# Patient Record
Sex: Female | Born: 1955
Health system: Southern US, Community
[De-identification: ages and names within clinical notes are randomized; demographics above are authoritative.]

## PROBLEM LIST (undated history)

## (undated) DIAGNOSIS — I10 Essential (primary) hypertension: Secondary | ICD-10-CM

## (undated) DIAGNOSIS — R109 Unspecified abdominal pain: Secondary | ICD-10-CM

## (undated) DIAGNOSIS — R945 Abnormal results of liver function studies: Secondary | ICD-10-CM

## (undated) DIAGNOSIS — T7840XA Allergy, unspecified, initial encounter: Secondary | ICD-10-CM

## (undated) HISTORY — PX: ABDOMINAL HYSTERECTOMY: SHX81

## (undated) HISTORY — PX: CHOLECYSTECTOMY: SHX55

## (undated) HISTORY — DX: Abnormal results of liver function studies: R94.5

## (undated) HISTORY — DX: Allergy, unspecified, initial encounter: T78.40XA

---

## 1998-06-18 ENCOUNTER — Encounter: Admission: RE | Admit: 1998-06-18 | Discharge: 1998-09-16 | Payer: Self-pay | Admitting: *Deleted

## 1998-08-06 ENCOUNTER — Other Ambulatory Visit: Admission: RE | Admit: 1998-08-06 | Discharge: 1998-08-06 | Payer: Self-pay | Admitting: Obstetrics and Gynecology

## 1998-08-16 ENCOUNTER — Encounter: Payer: Self-pay | Admitting: Obstetrics and Gynecology

## 1998-08-20 ENCOUNTER — Ambulatory Visit (HOSPITAL_COMMUNITY): Admission: RE | Admit: 1998-08-20 | Discharge: 1998-08-20 | Payer: Self-pay | Admitting: Obstetrics and Gynecology

## 1998-08-22 ENCOUNTER — Inpatient Hospital Stay (HOSPITAL_COMMUNITY): Admission: RE | Admit: 1998-08-22 | Discharge: 1998-08-24 | Payer: Self-pay | Admitting: Obstetrics and Gynecology

## 1998-08-25 DIAGNOSIS — R109 Unspecified abdominal pain: Secondary | ICD-10-CM

## 1998-08-25 HISTORY — DX: Unspecified abdominal pain: R10.9

## 1999-07-23 ENCOUNTER — Other Ambulatory Visit: Admission: RE | Admit: 1999-07-23 | Discharge: 1999-07-23 | Payer: Self-pay | Admitting: Obstetrics and Gynecology

## 1999-09-24 ENCOUNTER — Emergency Department (HOSPITAL_COMMUNITY): Admission: EM | Admit: 1999-09-24 | Discharge: 1999-09-24 | Payer: Self-pay | Admitting: *Deleted

## 1999-11-20 ENCOUNTER — Emergency Department (HOSPITAL_COMMUNITY): Admission: EM | Admit: 1999-11-20 | Discharge: 1999-11-20 | Payer: Self-pay | Admitting: Emergency Medicine

## 1999-11-20 ENCOUNTER — Encounter: Payer: Self-pay | Admitting: Emergency Medicine

## 1999-11-29 ENCOUNTER — Encounter: Payer: Self-pay | Admitting: Gastroenterology

## 1999-11-29 ENCOUNTER — Ambulatory Visit (HOSPITAL_COMMUNITY): Admission: RE | Admit: 1999-11-29 | Discharge: 1999-11-29 | Payer: Self-pay | Admitting: Gastroenterology

## 2000-08-07 ENCOUNTER — Ambulatory Visit (HOSPITAL_COMMUNITY): Admission: RE | Admit: 2000-08-07 | Discharge: 2000-08-08 | Payer: Self-pay | Admitting: Surgery

## 2000-08-07 ENCOUNTER — Encounter (INDEPENDENT_AMBULATORY_CARE_PROVIDER_SITE_OTHER): Payer: Self-pay

## 2000-08-07 ENCOUNTER — Encounter: Payer: Self-pay | Admitting: Surgery

## 2000-08-18 ENCOUNTER — Emergency Department (HOSPITAL_COMMUNITY): Admission: EM | Admit: 2000-08-18 | Discharge: 2000-08-18 | Payer: Self-pay | Admitting: Emergency Medicine

## 2000-08-31 ENCOUNTER — Inpatient Hospital Stay (HOSPITAL_COMMUNITY): Admission: EM | Admit: 2000-08-31 | Discharge: 2000-09-03 | Payer: Self-pay | Admitting: *Deleted

## 2000-08-31 ENCOUNTER — Encounter: Payer: Self-pay | Admitting: Urology

## 2000-09-01 ENCOUNTER — Encounter: Payer: Self-pay | Admitting: Gastroenterology

## 2001-02-28 ENCOUNTER — Inpatient Hospital Stay (HOSPITAL_COMMUNITY): Admission: EM | Admit: 2001-02-28 | Discharge: 2001-03-02 | Payer: Self-pay | Admitting: Emergency Medicine

## 2001-02-28 ENCOUNTER — Encounter: Payer: Self-pay | Admitting: Emergency Medicine

## 2001-03-03 ENCOUNTER — Encounter (INDEPENDENT_AMBULATORY_CARE_PROVIDER_SITE_OTHER): Payer: Self-pay | Admitting: Specialist

## 2001-03-03 ENCOUNTER — Ambulatory Visit (HOSPITAL_COMMUNITY): Admission: RE | Admit: 2001-03-03 | Discharge: 2001-03-03 | Payer: Self-pay | Admitting: Gastroenterology

## 2001-03-04 ENCOUNTER — Encounter: Payer: Self-pay | Admitting: Gastroenterology

## 2001-03-04 ENCOUNTER — Ambulatory Visit (HOSPITAL_COMMUNITY): Admission: RE | Admit: 2001-03-04 | Discharge: 2001-03-04 | Payer: Self-pay | Admitting: Gastroenterology

## 2001-03-24 ENCOUNTER — Encounter (INDEPENDENT_AMBULATORY_CARE_PROVIDER_SITE_OTHER): Payer: Self-pay

## 2001-03-24 ENCOUNTER — Ambulatory Visit (HOSPITAL_COMMUNITY): Admission: RE | Admit: 2001-03-24 | Discharge: 2001-03-24 | Payer: Self-pay | Admitting: Gastroenterology

## 2001-04-12 ENCOUNTER — Other Ambulatory Visit: Admission: RE | Admit: 2001-04-12 | Discharge: 2001-04-12 | Payer: Self-pay | Admitting: Obstetrics and Gynecology

## 2001-05-14 ENCOUNTER — Encounter: Payer: Self-pay | Admitting: Internal Medicine

## 2001-05-14 ENCOUNTER — Inpatient Hospital Stay (HOSPITAL_COMMUNITY): Admission: EM | Admit: 2001-05-14 | Discharge: 2001-05-18 | Payer: Self-pay | Admitting: Emergency Medicine

## 2001-05-14 ENCOUNTER — Encounter: Payer: Self-pay | Admitting: Emergency Medicine

## 2001-05-17 ENCOUNTER — Encounter: Payer: Self-pay | Admitting: Internal Medicine

## 2002-12-18 ENCOUNTER — Emergency Department (HOSPITAL_COMMUNITY): Admission: EM | Admit: 2002-12-18 | Discharge: 2002-12-18 | Payer: Self-pay | Admitting: Emergency Medicine

## 2002-12-18 ENCOUNTER — Encounter: Payer: Self-pay | Admitting: Emergency Medicine

## 2002-12-21 ENCOUNTER — Emergency Department (HOSPITAL_COMMUNITY): Admission: EM | Admit: 2002-12-21 | Discharge: 2002-12-21 | Payer: Self-pay | Admitting: Emergency Medicine

## 2003-05-01 ENCOUNTER — Inpatient Hospital Stay (HOSPITAL_COMMUNITY): Admission: EM | Admit: 2003-05-01 | Discharge: 2003-05-02 | Payer: Self-pay | Admitting: Emergency Medicine

## 2003-05-01 ENCOUNTER — Encounter: Payer: Self-pay | Admitting: Internal Medicine

## 2003-11-20 ENCOUNTER — Observation Stay (HOSPITAL_COMMUNITY): Admission: EM | Admit: 2003-11-20 | Discharge: 2003-11-21 | Payer: Self-pay | Admitting: Emergency Medicine

## 2004-03-31 ENCOUNTER — Emergency Department (HOSPITAL_COMMUNITY): Admission: EM | Admit: 2004-03-31 | Discharge: 2004-04-01 | Payer: Self-pay | Admitting: Emergency Medicine

## 2004-08-29 ENCOUNTER — Emergency Department (HOSPITAL_COMMUNITY): Admission: EM | Admit: 2004-08-29 | Discharge: 2004-08-29 | Payer: Self-pay | Admitting: Emergency Medicine

## 2004-11-01 ENCOUNTER — Ambulatory Visit (HOSPITAL_COMMUNITY): Admission: RE | Admit: 2004-11-01 | Discharge: 2004-11-01 | Payer: Self-pay | Admitting: Gastroenterology

## 2004-11-01 ENCOUNTER — Encounter (INDEPENDENT_AMBULATORY_CARE_PROVIDER_SITE_OTHER): Payer: Self-pay | Admitting: Specialist

## 2005-03-09 ENCOUNTER — Emergency Department (HOSPITAL_COMMUNITY): Admission: EM | Admit: 2005-03-09 | Discharge: 2005-03-09 | Payer: Self-pay | Admitting: *Deleted

## 2005-04-02 ENCOUNTER — Emergency Department (HOSPITAL_COMMUNITY): Admission: EM | Admit: 2005-04-02 | Discharge: 2005-04-02 | Payer: Self-pay | Admitting: Emergency Medicine

## 2005-06-02 ENCOUNTER — Emergency Department (HOSPITAL_COMMUNITY): Admission: EM | Admit: 2005-06-02 | Discharge: 2005-06-02 | Payer: Self-pay | Admitting: Emergency Medicine

## 2005-06-23 ENCOUNTER — Encounter: Admission: RE | Admit: 2005-06-23 | Discharge: 2005-06-23 | Payer: Self-pay | Admitting: Occupational Medicine

## 2005-11-12 ENCOUNTER — Emergency Department (HOSPITAL_COMMUNITY): Admission: EM | Admit: 2005-11-12 | Discharge: 2005-11-12 | Payer: Self-pay | Admitting: Emergency Medicine

## 2006-04-21 ENCOUNTER — Emergency Department (HOSPITAL_COMMUNITY): Admission: EM | Admit: 2006-04-21 | Discharge: 2006-04-21 | Payer: Self-pay | Admitting: Emergency Medicine

## 2006-05-23 ENCOUNTER — Emergency Department (HOSPITAL_COMMUNITY): Admission: EM | Admit: 2006-05-23 | Discharge: 2006-05-24 | Payer: Self-pay | Admitting: Emergency Medicine

## 2006-08-03 ENCOUNTER — Emergency Department (HOSPITAL_COMMUNITY): Admission: EM | Admit: 2006-08-03 | Discharge: 2006-08-03 | Payer: Self-pay | Admitting: Emergency Medicine

## 2006-08-25 LAB — HM COLONOSCOPY

## 2006-11-19 ENCOUNTER — Emergency Department (HOSPITAL_COMMUNITY): Admission: EM | Admit: 2006-11-19 | Discharge: 2006-11-19 | Payer: Self-pay | Admitting: Emergency Medicine

## 2006-12-01 ENCOUNTER — Emergency Department (HOSPITAL_COMMUNITY): Admission: EM | Admit: 2006-12-01 | Discharge: 2006-12-01 | Payer: Self-pay | Admitting: Emergency Medicine

## 2007-01-30 ENCOUNTER — Emergency Department (HOSPITAL_COMMUNITY): Admission: EM | Admit: 2007-01-30 | Discharge: 2007-01-31 | Payer: Self-pay | Admitting: Emergency Medicine

## 2007-01-31 ENCOUNTER — Emergency Department (HOSPITAL_COMMUNITY): Admission: EM | Admit: 2007-01-31 | Discharge: 2007-02-01 | Payer: Self-pay | Admitting: Emergency Medicine

## 2007-04-29 ENCOUNTER — Emergency Department (HOSPITAL_COMMUNITY): Admission: EM | Admit: 2007-04-29 | Discharge: 2007-04-29 | Payer: Self-pay | Admitting: Emergency Medicine

## 2008-03-19 ENCOUNTER — Emergency Department (HOSPITAL_COMMUNITY): Admission: EM | Admit: 2008-03-19 | Discharge: 2008-03-20 | Payer: Self-pay | Admitting: Emergency Medicine

## 2008-09-12 ENCOUNTER — Emergency Department (HOSPITAL_COMMUNITY): Admission: EM | Admit: 2008-09-12 | Discharge: 2008-09-12 | Payer: Self-pay | Admitting: Emergency Medicine

## 2008-09-13 ENCOUNTER — Inpatient Hospital Stay (HOSPITAL_COMMUNITY): Admission: EM | Admit: 2008-09-13 | Discharge: 2008-09-15 | Payer: Self-pay | Admitting: Emergency Medicine

## 2008-09-13 ENCOUNTER — Other Ambulatory Visit: Payer: Self-pay | Admitting: Emergency Medicine

## 2008-12-20 ENCOUNTER — Emergency Department (HOSPITAL_BASED_OUTPATIENT_CLINIC_OR_DEPARTMENT_OTHER): Admission: EM | Admit: 2008-12-20 | Discharge: 2008-12-20 | Payer: Self-pay | Admitting: Emergency Medicine

## 2008-12-23 ENCOUNTER — Encounter: Payer: Self-pay | Admitting: Emergency Medicine

## 2008-12-23 ENCOUNTER — Observation Stay (HOSPITAL_COMMUNITY): Admission: EM | Admit: 2008-12-23 | Discharge: 2008-12-25 | Payer: Self-pay | Admitting: Internal Medicine

## 2008-12-23 ENCOUNTER — Ambulatory Visit: Payer: Self-pay | Admitting: Radiology

## 2009-04-23 ENCOUNTER — Emergency Department (HOSPITAL_BASED_OUTPATIENT_CLINIC_OR_DEPARTMENT_OTHER): Admission: EM | Admit: 2009-04-23 | Discharge: 2009-04-23 | Payer: Self-pay | Admitting: Emergency Medicine

## 2009-04-24 ENCOUNTER — Emergency Department (HOSPITAL_BASED_OUTPATIENT_CLINIC_OR_DEPARTMENT_OTHER): Admission: EM | Admit: 2009-04-24 | Discharge: 2009-04-24 | Payer: Self-pay | Admitting: Emergency Medicine

## 2009-07-31 ENCOUNTER — Ambulatory Visit: Payer: Self-pay | Admitting: Radiology

## 2009-07-31 ENCOUNTER — Encounter: Payer: Self-pay | Admitting: Emergency Medicine

## 2009-07-31 ENCOUNTER — Observation Stay (HOSPITAL_COMMUNITY): Admission: EM | Admit: 2009-07-31 | Discharge: 2009-08-02 | Payer: Self-pay | Admitting: Internal Medicine

## 2009-10-22 ENCOUNTER — Emergency Department (HOSPITAL_BASED_OUTPATIENT_CLINIC_OR_DEPARTMENT_OTHER): Admission: EM | Admit: 2009-10-22 | Discharge: 2009-10-23 | Payer: Self-pay | Admitting: Emergency Medicine

## 2009-11-23 ENCOUNTER — Other Ambulatory Visit: Payer: Self-pay | Admitting: Emergency Medicine

## 2009-11-23 ENCOUNTER — Emergency Department (HOSPITAL_BASED_OUTPATIENT_CLINIC_OR_DEPARTMENT_OTHER): Admission: EM | Admit: 2009-11-23 | Discharge: 2009-11-23 | Payer: Self-pay | Admitting: Emergency Medicine

## 2009-11-23 ENCOUNTER — Inpatient Hospital Stay (HOSPITAL_COMMUNITY): Admission: EM | Admit: 2009-11-23 | Discharge: 2009-11-26 | Payer: Self-pay

## 2009-11-23 ENCOUNTER — Ambulatory Visit: Payer: Self-pay | Admitting: Diagnostic Radiology

## 2009-12-02 ENCOUNTER — Emergency Department (HOSPITAL_BASED_OUTPATIENT_CLINIC_OR_DEPARTMENT_OTHER): Admission: EM | Admit: 2009-12-02 | Discharge: 2009-12-03 | Payer: Self-pay | Admitting: Emergency Medicine

## 2009-12-03 ENCOUNTER — Ambulatory Visit: Payer: Self-pay | Admitting: Diagnostic Radiology

## 2009-12-25 ENCOUNTER — Inpatient Hospital Stay (HOSPITAL_COMMUNITY): Admission: EM | Admit: 2009-12-25 | Discharge: 2009-12-28 | Payer: Self-pay | Admitting: Internal Medicine

## 2009-12-25 ENCOUNTER — Ambulatory Visit: Payer: Self-pay | Admitting: Diagnostic Radiology

## 2009-12-25 ENCOUNTER — Encounter: Payer: Self-pay | Admitting: Emergency Medicine

## 2010-03-07 ENCOUNTER — Ambulatory Visit: Payer: Self-pay | Admitting: Diagnostic Radiology

## 2010-03-07 ENCOUNTER — Emergency Department (HOSPITAL_BASED_OUTPATIENT_CLINIC_OR_DEPARTMENT_OTHER): Admission: EM | Admit: 2010-03-07 | Discharge: 2010-03-07 | Payer: Self-pay | Admitting: Emergency Medicine

## 2010-03-19 ENCOUNTER — Emergency Department (HOSPITAL_BASED_OUTPATIENT_CLINIC_OR_DEPARTMENT_OTHER): Admission: EM | Admit: 2010-03-19 | Discharge: 2010-03-19 | Payer: Self-pay | Admitting: Emergency Medicine

## 2010-04-05 ENCOUNTER — Emergency Department (HOSPITAL_BASED_OUTPATIENT_CLINIC_OR_DEPARTMENT_OTHER): Admission: EM | Admit: 2010-04-05 | Discharge: 2010-04-05 | Payer: Self-pay | Admitting: Emergency Medicine

## 2010-11-08 LAB — DIFFERENTIAL
Basophils Absolute: 0 10*3/uL (ref 0.0–0.1)
Lymphocytes Relative: 29 % (ref 12–46)
Monocytes Relative: 6 % (ref 3–12)
Neutrophils Relative %: 64 % (ref 43–77)

## 2010-11-08 LAB — CBC
HCT: 36.7 % (ref 36.0–46.0)
MCH: 33.9 pg (ref 26.0–34.0)
MCV: 97.6 fL (ref 78.0–100.0)
WBC: 7.9 10*3/uL (ref 4.0–10.5)

## 2010-11-08 LAB — BASIC METABOLIC PANEL
BUN: 10 mg/dL (ref 6–23)
Calcium: 9.6 mg/dL (ref 8.4–10.5)
Creatinine, Ser: 0.6 mg/dL (ref 0.4–1.2)
GFR calc non Af Amer: >60 mL/min (ref >60)
Sodium: 143 mEq/L (ref 135–145)

## 2010-11-08 LAB — URINALYSIS, ROUTINE W REFLEX MICROSCOPIC
Bilirubin Urine: NEGATIVE
Glucose, UA: NEGATIVE mg/dL
Nitrite: NEGATIVE
Specific Gravity, Urine: 1.006 (ref 1.005–1.030)

## 2010-11-09 LAB — CBC
HCT: 38.8 % (ref 36.0–46.0)
Hemoglobin: 13.2 g/dL (ref 12.0–15.0)
Platelets: 336 10*3/uL (ref 150–400)
WBC: 8.5 10*3/uL (ref 4.0–10.5)

## 2010-11-09 LAB — URINALYSIS, ROUTINE W REFLEX MICROSCOPIC
Ketones, ur: NEGATIVE mg/dL
Nitrite: NEGATIVE
Specific Gravity, Urine: 1.009 (ref 1.005–1.030)

## 2010-11-09 LAB — COMPREHENSIVE METABOLIC PANEL
ALT: 29 U/L (ref 0–35)
BUN: 9 mg/dL (ref 6–23)
CO2: 27 mEq/L (ref 19–32)
Calcium: 9.7 mg/dL (ref 8.4–10.5)
GFR calc non Af Amer: >60 mL/min (ref >60)
Glucose, Bld: 99 mg/dL (ref 70–99)
Potassium: 4.1 mEq/L (ref 3.5–5.1)
Total Bilirubin: 0.6 mg/dL (ref 0.3–1.2)
Total Protein: 7.3 g/dL (ref 6.0–8.3)

## 2010-11-09 LAB — URINE MICROSCOPIC-ADD ON

## 2010-11-09 LAB — DIFFERENTIAL
Basophils Absolute: 0 10*3/uL (ref 0.0–0.1)
Eosinophils Relative: 2 % (ref 0–5)
Monocytes Absolute: 0.5 10*3/uL (ref 0.1–1.0)
Monocytes Relative: 6 % (ref 3–12)
Neutrophils Relative %: 58 % (ref 43–77)

## 2010-11-09 LAB — LIPASE, BLOOD: Lipase: 27 U/L (ref 23–300)

## 2010-11-10 LAB — COMPREHENSIVE METABOLIC PANEL
ALT: 16 U/L (ref 0–35)
AST: 16 U/L (ref 0–37)
BUN: 14 mg/dL (ref 6–23)
CO2: 23 mEq/L (ref 19–32)
Calcium: 9.8 mg/dL (ref 8.4–10.5)
Chloride: 105 mEq/L (ref 96–112)
Glucose, Bld: 148 mg/dL — ABNORMAL HIGH (ref 70–99)
Potassium: 3.3 mEq/L — ABNORMAL LOW (ref 3.5–5.1)
Sodium: 146 mEq/L — ABNORMAL HIGH (ref 135–145)
Total Bilirubin: 0.7 mg/dL (ref 0.3–1.2)
Total Protein: 7.7 g/dL (ref 6.0–8.3)

## 2010-11-10 LAB — DIFFERENTIAL
Basophils Absolute: 0.1 10*3/uL (ref 0.0–0.1)
Basophils Relative: 1 % (ref 0–1)
Lymphocytes Relative: 13 % (ref 12–46)
Lymphs Abs: 1.9 10*3/uL (ref 0.7–4.0)
Monocytes Absolute: 0.2 10*3/uL (ref 0.1–1.0)
Monocytes Relative: 1 % — ABNORMAL LOW (ref 3–12)

## 2010-11-10 LAB — CBC
Hemoglobin: 13.6 g/dL (ref 12.0–15.0)
MCHC: 33.5 g/dL (ref 30.0–36.0)
RDW: 13.1 % (ref 11.5–15.5)
WBC: 14.7 10*3/uL — ABNORMAL HIGH (ref 4.0–10.5)

## 2010-11-10 LAB — ETHANOL: Alcohol, Ethyl (B): <10 mg/dL (ref 0–10)

## 2010-11-12 LAB — CBC
HCT: 41.9 % (ref 36.0–46.0)
HCT: 37.2 % (ref 36.0–46.0)
Hemoglobin: 12.4 g/dL (ref 12.0–15.0)
Platelets: 325 10*3/uL (ref 150–400)
WBC: 8.1 10*3/uL (ref 4.0–10.5)
WBC: 7 10*3/uL (ref 4.0–10.5)

## 2010-11-12 LAB — BASIC METABOLIC PANEL
CO2: 29 mEq/L (ref 19–32)
Calcium: 8.6 mg/dL (ref 8.4–10.5)
Calcium: 9.2 mg/dL (ref 8.4–10.5)
Creatinine, Ser: 0.63 mg/dL (ref 0.4–1.2)
GFR calc Af Amer: >60 mL/min (ref >60)
GFR calc Af Amer: >60 mL/min (ref >60)
GFR calc non Af Amer: >60 mL/min (ref >60)
Glucose, Bld: 101 mg/dL — ABNORMAL HIGH (ref 70–99)
Sodium: 139 mEq/L (ref 135–145)

## 2010-11-12 LAB — COMPREHENSIVE METABOLIC PANEL
ALT: 18 U/L (ref 0–35)
AST: 23 U/L (ref 0–37)
Albumin: 4.3 g/dL (ref 3.5–5.2)
Alkaline Phosphatase: 168 U/L — ABNORMAL HIGH (ref 39–117)
Alkaline Phosphatase: 115 U/L (ref 39–117)
BUN: 6 mg/dL (ref 6–23)
BUN: 4 mg/dL — ABNORMAL LOW (ref 6–23)
CO2: 27 mEq/L (ref 19–32)
Chloride: 108 mEq/L (ref 96–112)
Chloride: 107 mEq/L (ref 96–112)
GFR calc non Af Amer: >60 mL/min (ref >60)
Glucose, Bld: 101 mg/dL — ABNORMAL HIGH (ref 70–99)
Potassium: 3.7 mEq/L (ref 3.5–5.1)
Potassium: 3.2 mEq/L — ABNORMAL LOW (ref 3.5–5.1)
Sodium: 143 mEq/L (ref 135–145)
Total Bilirubin: 1 mg/dL (ref 0.3–1.2)
Total Bilirubin: 0.9 mg/dL (ref 0.3–1.2)
Total Protein: 7.6 g/dL (ref 6.0–8.3)

## 2010-11-12 LAB — ETHANOL: Alcohol, Ethyl (B): <5 mg/dL (ref 0–10)

## 2010-11-12 LAB — URINALYSIS, ROUTINE W REFLEX MICROSCOPIC
Leukocytes, UA: NEGATIVE
Nitrite: NEGATIVE

## 2010-11-12 LAB — DIFFERENTIAL
Basophils Absolute: 0 10*3/uL (ref 0.0–0.1)
Basophils Absolute: 0 10*3/uL (ref 0.0–0.1)
Basophils Relative: 0 % (ref 0–1)
Basophils Relative: 1 % (ref 0–1)
Eosinophils Absolute: 0 10*3/uL (ref 0.0–0.7)
Eosinophils Relative: 0 % (ref 0–5)
Monocytes Absolute: 0.3 10*3/uL (ref 0.1–1.0)
Monocytes Absolute: 0.6 10*3/uL (ref 0.1–1.0)
Monocytes Relative: 4 % (ref 3–12)
Neutro Abs: 3.2 10*3/uL (ref 1.7–7.7)
Neutrophils Relative %: 47 % (ref 43–77)

## 2010-11-12 LAB — URINE MICROSCOPIC-ADD ON

## 2010-11-13 LAB — COMPREHENSIVE METABOLIC PANEL
ALT: 25 U/L (ref 0–35)
AST: 17 U/L (ref 0–37)
AST: 22 U/L (ref 0–37)
Albumin: 4.5 g/dL (ref 3.5–5.2)
Albumin: 4.7 g/dL (ref 3.5–5.2)
BUN: 12 mg/dL (ref 6–23)
CO2: 28 mEq/L (ref 19–32)
Calcium: 9.4 mg/dL (ref 8.4–10.5)
Calcium: 9.7 mg/dL (ref 8.4–10.5)
Chloride: 104 mEq/L (ref 96–112)
Creatinine, Ser: 0.6 mg/dL (ref 0.4–1.2)
Creatinine, Ser: 0.6 mg/dL (ref 0.4–1.2)
GFR calc Af Amer: >60 mL/min (ref >60)
GFR calc Af Amer: >60 mL/min (ref >60)
GFR calc non Af Amer: >60 mL/min (ref >60)
Glucose, Bld: 116 mg/dL — ABNORMAL HIGH (ref 70–99)
Sodium: 150 mEq/L — ABNORMAL HIGH (ref 135–145)
Total Bilirubin: 0.6 mg/dL (ref 0.3–1.2)
Total Protein: 7 g/dL (ref 6.0–8.3)
Total Protein: 7.8 g/dL (ref 6.0–8.3)
Total Protein: 7.9 g/dL (ref 6.0–8.3)

## 2010-11-13 LAB — DIFFERENTIAL
Basophils Absolute: 0.3 10*3/uL — ABNORMAL HIGH (ref 0.0–0.1)
Eosinophils Absolute: 0 10*3/uL (ref 0.0–0.7)
Eosinophils Relative: 0 % (ref 0–5)
Eosinophils Relative: 0 % (ref 0–5)
Lymphocytes Relative: 12 % (ref 12–46)
Lymphocytes Relative: 14 % (ref 12–46)
Lymphs Abs: 1.7 10*3/uL (ref 0.7–4.0)
Lymphs Abs: 1.8 10*3/uL (ref 0.7–4.0)
Lymphs Abs: 2 10*3/uL (ref 0.7–4.0)
Monocytes Absolute: 1 10*3/uL (ref 0.1–1.0)
Monocytes Relative: 2 % — ABNORMAL LOW (ref 3–12)
Monocytes Relative: 4 % (ref 3–12)
Monocytes Relative: 7 % (ref 3–12)
Neutro Abs: 12 10*3/uL — ABNORMAL HIGH (ref 1.7–7.7)
Neutro Abs: 9.6 10*3/uL — ABNORMAL HIGH (ref 1.7–7.7)
Neutrophils Relative %: 80 % — ABNORMAL HIGH (ref 43–77)

## 2010-11-13 LAB — BASIC METABOLIC PANEL
CO2: 26 mEq/L (ref 19–32)
CO2: 26 mEq/L (ref 19–32)
Calcium: 8.7 mg/dL (ref 8.4–10.5)
Calcium: 8.9 mg/dL (ref 8.4–10.5)
Chloride: 106 mEq/L (ref 96–112)
Creatinine, Ser: 0.6 mg/dL (ref 0.4–1.2)
GFR calc Af Amer: >60 mL/min (ref >60)
GFR calc Af Amer: >60 mL/min (ref >60)
GFR calc Af Amer: >60 mL/min (ref >60)
GFR calc non Af Amer: >60 mL/min (ref >60)
Potassium: 3.6 mEq/L (ref 3.5–5.1)
Potassium: 3.1 mEq/L — ABNORMAL LOW (ref 3.5–5.1)
Sodium: 143 mEq/L (ref 135–145)
Sodium: 136 mEq/L (ref 135–145)
Sodium: 137 mEq/L (ref 135–145)

## 2010-11-13 LAB — MAGNESIUM: Magnesium: 1.7 mg/dL (ref 1.5–2.5)

## 2010-11-13 LAB — CBC
Hemoglobin: 13.1 g/dL (ref 12.0–15.0)
MCHC: 33.8 g/dL (ref 30.0–36.0)
MCHC: 33.9 g/dL (ref 30.0–36.0)
MCHC: 34 g/dL (ref 30.0–36.0)
MCV: 102.7 fL — ABNORMAL HIGH (ref 78.0–100.0)
MCV: 104 fL — ABNORMAL HIGH (ref 78.0–100.0)
Platelets: 488 10*3/uL — ABNORMAL HIGH (ref 150–400)
Platelets: 563 10*3/uL — ABNORMAL HIGH (ref 150–400)
RBC: 3.71 MIL/uL — ABNORMAL LOW (ref 3.87–5.11)
RBC: 4.14 MIL/uL (ref 3.87–5.11)
RBC: 3.28 MIL/uL — ABNORMAL LOW (ref 3.87–5.11)
RDW: 14.6 % (ref 11.5–15.5)
RDW: 15.5 % (ref 11.5–15.5)
RDW: 15.6 % — ABNORMAL HIGH (ref 11.5–15.5)
RDW: 16.1 % — ABNORMAL HIGH (ref 11.5–15.5)
WBC: 15.1 10*3/uL — ABNORMAL HIGH (ref 4.0–10.5)

## 2010-11-13 LAB — URINE MICROSCOPIC-ADD ON

## 2010-11-13 LAB — POCT TOXICOLOGY PANEL

## 2010-11-13 LAB — URINALYSIS, ROUTINE W REFLEX MICROSCOPIC
Bilirubin Urine: NEGATIVE
Glucose, UA: NEGATIVE mg/dL
Glucose, UA: NEGATIVE mg/dL
Glucose, UA: NEGATIVE mg/dL
Ketones, ur: NEGATIVE mg/dL
Ketones, ur: NEGATIVE mg/dL
Leukocytes, UA: NEGATIVE
Nitrite: NEGATIVE
Protein, ur: NEGATIVE mg/dL
Specific Gravity, Urine: 1.018 (ref 1.005–1.030)
Specific Gravity, Urine: 1.025 (ref 1.005–1.030)
Urobilinogen, UA: 0.2 mg/dL (ref 0.0–1.0)
pH: 6 (ref 5.0–8.0)
pH: 6 (ref 5.0–8.0)
pH: 6 (ref 5.0–8.0)

## 2010-11-13 LAB — URINE CULTURE: Colony Count: 35000

## 2010-11-13 LAB — ETHANOL: Alcohol, Ethyl (B): <5 mg/dL (ref 0–10)

## 2010-11-13 LAB — LIPASE, BLOOD: Lipase: 28 U/L (ref 23–300)

## 2010-11-26 LAB — BASIC METABOLIC PANEL
CO2: 25 mEq/L (ref 19–32)
CO2: 25 mEq/L (ref 19–32)
Calcium: 8.5 mg/dL (ref 8.4–10.5)
Calcium: 8.6 mg/dL (ref 8.4–10.5)
Chloride: 107 mEq/L (ref 96–112)
Creatinine, Ser: 0.57 mg/dL (ref 0.4–1.2)
Creatinine, Ser: 0.66 mg/dL (ref 0.4–1.2)
GFR calc Af Amer: >60 mL/min (ref >60)
GFR calc Af Amer: >60 mL/min (ref >60)
GFR calc non Af Amer: >60 mL/min (ref >60)
Glucose, Bld: 154 mg/dL — ABNORMAL HIGH (ref 70–99)
Sodium: 138 mEq/L (ref 135–145)

## 2010-11-26 LAB — COMPREHENSIVE METABOLIC PANEL
Alkaline Phosphatase: 275 U/L — ABNORMAL HIGH (ref 39–117)
CO2: 26 mEq/L (ref 19–32)
Chloride: 97 mEq/L (ref 96–112)
GFR calc Af Amer: >60 mL/min (ref >60)
Sodium: 142 mEq/L (ref 135–145)

## 2010-11-26 LAB — DIFFERENTIAL
Basophils Absolute: 0.2 K/uL — ABNORMAL HIGH (ref 0.0–0.1)
Basophils Relative: 1 % (ref 0–1)
Eosinophils Absolute: 0 10*3/uL (ref 0.0–0.7)
Eosinophils Relative: 0 % (ref 0–5)
Lymphocytes Relative: 14 % (ref 12–46)
Lymphs Abs: 2.2 K/uL (ref 0.7–4.0)
Monocytes Absolute: 0.3 K/uL (ref 0.1–1.0)
Monocytes Relative: 2 % — ABNORMAL LOW (ref 3–12)
Neutro Abs: 12.7 K/uL — ABNORMAL HIGH (ref 1.7–7.7)
Neutrophils Relative %: 83 % — ABNORMAL HIGH (ref 43–77)

## 2010-11-26 LAB — CBC
HCT: 43.5 % (ref 36.0–46.0)
Hemoglobin: 14.7 g/dL (ref 12.0–15.0)
MCHC: 33.9 g/dL (ref 30.0–36.0)
MCHC: 34.9 g/dL (ref 30.0–36.0)
MCV: 96.9 fL (ref 78.0–100.0)
Platelets: 613 K/uL — ABNORMAL HIGH (ref 150–400)
Platelets: 339 10*3/uL (ref 150–400)
RBC: 4.48 MIL/uL (ref 3.87–5.11)
RDW: 14 % (ref 11.5–15.5)
RDW: 15.3 % (ref 11.5–15.5)
WBC: 15.4 10*3/uL — ABNORMAL HIGH (ref 4.0–10.5)

## 2010-11-26 LAB — COMPREHENSIVE METABOLIC PANEL WITH GFR
ALT: 80 U/L — ABNORMAL HIGH (ref 0–35)
AST: 38 U/L — ABNORMAL HIGH (ref 0–37)
Albumin: 5.2 g/dL (ref 3.5–5.2)
BUN: 12 mg/dL (ref 6–23)
Calcium: 10 mg/dL (ref 8.4–10.5)
Creatinine, Ser: 0.7 mg/dL (ref 0.4–1.2)
GFR calc non Af Amer: >60 mL/min (ref >60)
Glucose, Bld: 166 mg/dL — ABNORMAL HIGH (ref 70–99)
Potassium: 4.5 meq/L (ref 3.5–5.1)
Total Bilirubin: 0.9 mg/dL (ref 0.3–1.2)
Total Protein: 8.3 g/dL (ref 6.0–8.3)

## 2010-11-26 LAB — URINE MICROSCOPIC-ADD ON

## 2010-11-26 LAB — LIPASE, BLOOD: Lipase: 57 U/L (ref 23–300)

## 2010-11-26 LAB — URINALYSIS, ROUTINE W REFLEX MICROSCOPIC
Bilirubin Urine: NEGATIVE
Glucose, UA: NEGATIVE mg/dL
Ketones, ur: NEGATIVE mg/dL
Leukocytes, UA: NEGATIVE
Nitrite: NEGATIVE
Protein, ur: NEGATIVE mg/dL
Specific Gravity, Urine: 1.011 (ref 1.005–1.030)
Urobilinogen, UA: 0.2 mg/dL (ref 0.0–1.0)
pH: 7 (ref 5.0–8.0)

## 2010-11-26 LAB — RAPID URINE DRUG SCREEN, HOSP PERFORMED
Cocaine: NOT DETECTED
Opiates: POSITIVE — AB

## 2010-11-26 LAB — CULTURE, BLOOD (ROUTINE X 2): Culture: NO GROWTH

## 2010-11-30 LAB — URINALYSIS, ROUTINE W REFLEX MICROSCOPIC
Glucose, UA: NEGATIVE mg/dL
Hgb urine dipstick: NEGATIVE
Protein, ur: NEGATIVE mg/dL

## 2010-12-03 LAB — CBC
HCT: 41.9 % (ref 36.0–46.0)
HCT: 32.9 % — ABNORMAL LOW (ref 36.0–46.0)
Hemoglobin: 13.8 g/dL (ref 12.0–15.0)
MCHC: 35.3 g/dL (ref 30.0–36.0)
Platelets: 313 10*3/uL (ref 150–400)
RBC: 4.15 MIL/uL (ref 3.87–5.11)
RDW: 13.7 % (ref 11.5–15.5)
RDW: 14.7 % (ref 11.5–15.5)
WBC: 13.2 10*3/uL — ABNORMAL HIGH (ref 4.0–10.5)

## 2010-12-03 LAB — COMPREHENSIVE METABOLIC PANEL
Alkaline Phosphatase: 113 U/L (ref 39–117)
BUN: 14 mg/dL (ref 6–23)
CO2: 29 mEq/L (ref 19–32)
Chloride: 90 mEq/L — ABNORMAL LOW (ref 96–112)
GFR calc non Af Amer: >60 mL/min (ref >60)
Glucose, Bld: 130 mg/dL — ABNORMAL HIGH (ref 70–99)
Potassium: 2.7 mEq/L — CL (ref 3.5–5.1)
Total Bilirubin: 0.8 mg/dL (ref 0.3–1.2)

## 2010-12-03 LAB — DIFFERENTIAL
Basophils Absolute: 0 10*3/uL (ref 0.0–0.1)
Basophils Relative: 0 % (ref 0–1)
Eosinophils Absolute: 0.1 10*3/uL (ref 0.0–0.7)
Neutro Abs: 9.3 10*3/uL — ABNORMAL HIGH (ref 1.7–7.7)
Neutrophils Relative %: 71 % (ref 43–77)

## 2010-12-03 LAB — BASIC METABOLIC PANEL
BUN: 3 mg/dL — ABNORMAL LOW (ref 6–23)
BUN: 7 mg/dL (ref 6–23)
CO2: 26 mEq/L (ref 19–32)
CO2: 28 mEq/L (ref 19–32)
Calcium: 8.8 mg/dL (ref 8.4–10.5)
GFR calc non Af Amer: >60 mL/min (ref >60)
GFR calc non Af Amer: >60 mL/min (ref >60)
Glucose, Bld: 105 mg/dL — ABNORMAL HIGH (ref 70–99)
Glucose, Bld: 97 mg/dL (ref 70–99)
Potassium: 3.8 mEq/L (ref 3.5–5.1)

## 2010-12-03 LAB — PROTIME-INR
INR: 2.4 — ABNORMAL HIGH (ref 0.00–1.49)
INR: 2.6 — ABNORMAL HIGH (ref 0.00–1.49)
Prothrombin Time: 28.2 seconds — ABNORMAL HIGH (ref 11.6–15.2)

## 2010-12-03 LAB — HEMOCCULT GUIAC POC 1CARD (OFFICE): Fecal Occult Bld: NEGATIVE

## 2010-12-04 LAB — URINALYSIS, ROUTINE W REFLEX MICROSCOPIC
Leukocytes, UA: NEGATIVE
Nitrite: NEGATIVE
Specific Gravity, Urine: 1.007 (ref 1.005–1.030)
pH: 5.5 (ref 5.0–8.0)

## 2010-12-04 LAB — DIFFERENTIAL
Eosinophils Relative: 0 % (ref 0–5)
Lymphocytes Relative: 14 % (ref 12–46)
Monocytes Absolute: 0.3 10*3/uL (ref 0.1–1.0)
Monocytes Relative: 4 % (ref 3–12)
Neutro Abs: 5.5 10*3/uL (ref 1.7–7.7)

## 2010-12-04 LAB — CBC
HCT: 39.9 % (ref 36.0–46.0)
Hemoglobin: 13.4 g/dL (ref 12.0–15.0)
MCHC: 33.7 g/dL (ref 30.0–36.0)
RBC: 3.93 MIL/uL (ref 3.87–5.11)

## 2010-12-04 LAB — BASIC METABOLIC PANEL
CO2: 26 mEq/L (ref 19–32)
Calcium: 9.8 mg/dL (ref 8.4–10.5)
Chloride: 92 mEq/L — ABNORMAL LOW (ref 96–112)
GFR calc Af Amer: >60 mL/min (ref >60)
Glucose, Bld: 111 mg/dL — ABNORMAL HIGH (ref 70–99)
Potassium: 3.3 mEq/L — ABNORMAL LOW (ref 3.5–5.1)
Sodium: 133 mEq/L — ABNORMAL LOW (ref 135–145)

## 2010-12-04 LAB — URINE MICROSCOPIC-ADD ON

## 2010-12-04 LAB — HEMOCCULT GUIAC POC 1CARD (OFFICE): Fecal Occult Bld: POSITIVE

## 2010-12-09 LAB — CBC
HCT: 31.1 % — ABNORMAL LOW (ref 36.0–46.0)
HCT: 39.6 % (ref 36.0–46.0)
Hemoglobin: 10.3 g/dL — ABNORMAL LOW (ref 12.0–15.0)
Hemoglobin: 13.1 g/dL (ref 12.0–15.0)
MCV: 101.1 fL — ABNORMAL HIGH (ref 78.0–100.0)
MCV: 102.7 fL — ABNORMAL HIGH (ref 78.0–100.0)
Platelets: 249 10*3/uL (ref 150–400)
Platelets: 347 10*3/uL (ref 150–400)
RBC: 3.83 MIL/uL — ABNORMAL LOW (ref 3.87–5.11)
RDW: 13.4 % (ref 11.5–15.5)
WBC: 12.2 10*3/uL — ABNORMAL HIGH (ref 4.0–10.5)
WBC: 14.8 10*3/uL — ABNORMAL HIGH (ref 4.0–10.5)

## 2010-12-09 LAB — BASIC METABOLIC PANEL
BUN: 10 mg/dL (ref 6–23)
CO2: 25 mEq/L (ref 19–32)
Calcium: 10.1 mg/dL (ref 8.4–10.5)
Chloride: 105 mEq/L (ref 96–112)
Creatinine, Ser: 0.6 mg/dL (ref 0.4–1.2)
GFR calc Af Amer: >60 mL/min (ref >60)
GFR calc non Af Amer: >60 mL/min (ref >60)
Glucose, Bld: 99 mg/dL (ref 70–99)
Potassium: 3.3 mEq/L — ABNORMAL LOW (ref 3.5–5.1)
Sodium: 136 mEq/L (ref 135–145)

## 2010-12-09 LAB — C3 COMPLEMENT: C3 Complement: 148 mg/dL (ref 88–201)

## 2010-12-09 LAB — HEPARIN LEVEL (UNFRACTIONATED)
Heparin Unfractionated: 0.54 IU/mL (ref 0.30–0.70)
Heparin Unfractionated: 0.57 IU/mL (ref 0.30–0.70)

## 2010-12-09 LAB — DIFFERENTIAL
Basophils Absolute: 0 10*3/uL (ref 0.0–0.1)
Eosinophils Relative: 1 % (ref 0–5)
Lymphocytes Relative: 21 % (ref 12–46)
Lymphs Abs: 1.8 10*3/uL (ref 0.7–4.0)
Monocytes Absolute: 0.6 10*3/uL (ref 0.1–1.0)
Monocytes Relative: 4 % (ref 3–12)
Neutro Abs: 12.3 10*3/uL — ABNORMAL HIGH (ref 1.7–7.7)
Neutrophils Relative %: 73 % (ref 43–77)
Neutrophils Relative %: 83 % — ABNORMAL HIGH (ref 43–77)

## 2010-12-09 LAB — CULTURE, BLOOD (ROUTINE X 2)

## 2010-12-09 LAB — HEPATITIS PANEL, ACUTE
HCV Ab: NEGATIVE
Hepatitis B Surface Ag: NEGATIVE

## 2010-12-09 LAB — ANA: Anti Nuclear Antibody(ANA): NEGATIVE

## 2010-12-09 LAB — MPO/PR-3 (ANCA) ANTIBODIES

## 2010-12-09 LAB — PROTIME-INR: Prothrombin Time: 12.3 seconds (ref 11.6–15.2)

## 2011-01-07 NOTE — H&P (Signed)
NAMEMarland Kitchen  DOSHA, BROSHEARS NO.:  [id number]   MEDICAL RECORD NO.:  [id number]          PATIENT TYPE:  OBV   LOCATION:  5028                         FACILITY:  [facility]   PHYSICIAN:  Della Goo, M.D. DATE OF BIRTH:  January 30, 1956   DATE OF ADMISSION:  12/23/2008  DATE OF DISCHARGE:                              HISTORY & PHYSICAL   PRIMARY CARE PHYSICIAN:  Dr. Jacalyn Lefevre, [facility].   CHIEF COMPLAINT:  Abdominal pain, nausea and vomiting.   HISTORY OF PRESENT ILLNESS:  This is a 55 year old female who presented  to the emergency department at the [facility] with complaints  of nausea, vomiting and worsening abdominal pain.  She reports having  abdominal pain chronically but reports it has worsened over the past 5  days, associated with nausea and vomiting.  She denies having any  diarrhea or constipation.  She also denies having any fevers, chills,  chest pain or shortness of breath.  The patient states that she has had  abdominal pain since a cholecystectomy had been performed in 2001.  She  has had multiple workups and evaluations for her right upper quadrant  abdominal pain.  She has also had MRCP and endoscopies performed.   PAST MEDICAL HISTORY:  Significant for:  1. Chronic abdominal pain.  2. Hypertension.  3. Insomnia.  4. Depression.  5. Tobacco abuse.  6. Status post laparoscopic cholecystectomy.  7. Vasculitis, possibly Berger's disease, on Coumadin therapy      chronically.   MEDICATIONS:  Hydrochlorothiazide, lisinopril, Zofran, OxyContin,  Percocet, Coumadin, aspirin, Norvasc, prednisone and Ambien p.r.n.   ALLERGIES:  1. AVELOX.  2. IBUPROFEN.  3. PHENERGAN.  4. MORPHINE.   SOCIAL HISTORY:  The patient reports a previous history of tobacco  abuse.  She stated she quit when she was diagnosed with vasculitis which  was affecting her fingers.  No history of alcohol abuse and no history  of illicit drug usage.   REVIEW OF SYSTEMS:  Pertinents are mentioned above in the HPI.  All  other systems negative.   PHYSICAL EXAMINATION FINDINGS:  This is a 55 year old well-nourished,  well-developed female currently in no discomfort or acute distress.  VITAL SIGNS:  Temperature 98.9, blood pressure 145/97, heart rate 118,  respirations 22, O2 saturations 100% on room air.  HEENT:  Normocephalic, atraumatic.  There is no scleral icterus or  conjunctival pallor.  Pupils are equally round, reactive to light.  Extraocular movements are intact.  Funduscopic benign.  Nares are patent  bilaterally.  Oropharynx is clear.  NECK:  Supple, full range of motion.  No thyromegaly, adenopathy or  jugular venous distention.  CARDIOVASCULAR:  Tachycardiac rate, rhythm.  No murmurs, gallops, rubs.  LUNGS:  Clear to auscultation bilaterally.  ABDOMEN:  Positive bowel sounds, soft, mildly tender in the right upper  quadrant area.  No rebound, no guarding, no hepatosplenomegaly.  EXTREMITIES:  Without cyanosis, clubbing or edema.  NEUROLOGIC:  The patient is alert and oriented x3.  There are no focal  deficits.   LABORATORY STUDIES:  White blood cell count 13.2, hemoglobin 13.8,  hematocrit 41.9, MCV 100.9, platelets 478, neutrophils 71% lymphocytes  22%.  Chemistry with a sodium of 134, potassium 2.7, chloride 90, CO2  29, BUN 14, creatinine 0.9, glucose 130, lipase 109.  Prothrombin time  28.2, INR 2.4.  Fecal occult blood testing performed by EDP negative.  Acute abdominal series reveals no acute cardiopulmonary disease process  and normal bowel gas pattern.   ASSESSMENT:  A 55 year old female being admitted with:  1. Hypokalemia.  2. Nausea and vomiting.  3. Chronic abdominal pain.  4. Vasculitis  5. Tachycardia.   PLAN:  The patient will be admitted, placed on IV fluids for fluid  resuscitation.  The patient will also be placed on antiemetics as needed  and her potassium will be repleted, a magnesium level will  also be  ordered and repleted if needed.  Pain control therapy has also been  ordered and patient's regular medications will be further verified.  The  patient's Coumadin will be monitored and adjusted as needed and GI  prophylaxis has also been ordered.      Della Goo, M.D.  Electronically Signed     HJ/MEDQ  D:  12/24/2008  T:  12/24/2008  Job:  [id number]

## 2011-01-07 NOTE — H&P (Signed)
NAMEMarland Kitchen  Theresa, Carey NO.:  [id number]   MEDICAL RECORD NO.:  [id number]          PATIENT TYPE:  EMS   LOCATION:  MAJO                         FACILITY:  [facility]   PHYSICIAN:  Lonia Blood, M.D.DATE OF BIRTH:  May 05, 1956   DATE OF ADMISSION:  09/13/2008  DATE OF DISCHARGE:                              HISTORY & PHYSICAL   PRIMARY CARE PHYSICIAN:  [facility]   CHIEF COMPLAINT:  Severe left hand pain with decreased perfusion.   HISTORY OF PRESENT ILLNESS:  [name] is a pleasant 55 year  old female with no prior history of vascular disease or rheumatologic  disorders that we are aware of.  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  She suffered immediate pain of the hand  but no significant swelling or erythema.  The next morning and the  following day, the hand was very painful but there was no appreciable  discoloration.  The day following that when the patient states awoke,  her hand was extremely blue, particularly including the index and long  finger of the left hand.  She presented to [facility] Emergency Room  for evaluation.  There she was given an antibiotic and sent home.  Her  pain continued to increase over the following evening.  Today, when her  pain was much more severe and the purplish discoloration of her fingers  had worsened, she presented to the [facility] Emergency Room  for  evaluation.  The case was discussed with the vascular surgeon over the  phone who stated that symptoms sounded worrisome for vasculitis and  suggested that an arteriogram be accomplished.  The patient was  transferred to [facility] where interventional radiology carried out a  left upper extremity angiogram.  This revealed decreased perfusion to  the index and long fingers of the left hand without obvious cause which  was in fact consistent with  vasculitis.  The patient was then sent to  the ER.  The hand surgeon on call was consulted and was in surgery.  Recommendation was that the patient would be most appropriately admitted  to a medical service and the patient could be seen from a consultative  standpoint from the standpoint of the hand surgeon.  This is the history  that is reported to me by the ER physician.   At the time of my evaluation, the patient is resting in a hospital bed  in the clinical  decision unit with her left upper extremity elevated.  She reports excruciating pain in the hand which is much worse when it is  touched or when she attempts to move the hand. She has not had similar  problems in the past.  She denies any chest pain, shortness of breath,  fever or chills.  She denies any prior trauma to this hand.   REVIEW OF SYSTEMS:  The patient has severe chronic right upper quadrant  abdominal pain.  This has been worked up extensively in the  past and the  patient states that its nature, severity and frequency have not changed  as of late.  Otherwise  a comprehensive review of systems is  unremarkable.   PAST MEDICAL HISTORY:  1. Hypertension.  2. Status post cholecystectomy.  3. Status post hysterectomy.  4. History of tobacco abuse.  5. History of chronic abdominal pain with extensive evaluation at      multiple institutions.  6. Chronic insomnia.  7. Depression.   OUTPATIENT MEDICATIONS:  1. Ambien 10 mg q.h.s. p.r.n..  2. HCTZ 25 mg p.o. daily.  3. Lisinopril 20 mg daily.  4. Zofran p.r.n..  5. Hydromorphone p.r.n..  6. Percocet p.r.n.   ALLERGIES:  THE PATIENT REPORTS ALLERGY TO ALL NONSTEROIDALS, AVELOX  PHENERGAN AND MORPHINE.   FAMILY HISTORY:  Noncontributory.   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  She is divorced.  She occasional partakes of alcohol.  She  does smoke.   DATA REVIEWED:  BMET is unremarkable.  Coags are unremarkable.   CBC is  revealing only for a macrocytosis with MCV of 101 and hemoglobin of  13.2, white count is not elevated.  As noted above, angiography reveals  decreased perfusion to index and long finger of left hand without  obvious cause, worrisome for vasculitis.   PHYSICAL EXAMINATION:  Temperature 100.4, blood pressure 151/96, heart  rate 103, respiratory rate 16, O2 saturation is 100% on room air.  GENERAL:  Well developed, well nourished female in no acute respiratory  distress.  LUNGS:  Clear to auscultation bilaterally without wheezing or rhonchi.  CARDIOVASCULAR:  Regular rate and rhythm without murmurs, rubs or  gallops.  Normal S1 and S2.  ABDOMEN:  Overweight, soft.  Bowel sounds are present.  No organomegaly,  no rebound or ascites.  EXTREMITIES:  Lower extremities- no significant clubbing, cyanosis or  edema bilateral lower extremities.  Left upper extremity - the patient  has a nailbed ecchymosis consistent with a direct trauma on the index  finger of the left hand.  At the present time, she has very faint  purplish discoloration of the distal portions of the index and  long  finger.  There is a 1+ radial pulse palpable in this arm.  There is no  significant erythema.  The hand is somewhat cooler to the touch compared  to the right hand but not markedly so.   IMPRESSION AND PLAN:  1. Left hand ischemia.  The exact cause of the patient's decreased      perfusion as evidenced on her arteriogram is not clear.  The      clinical pattern and radiographic impression are most consistent      with vasculitis at the present time.  Hand surgery is to evaluate      the patient.  There is clearly no indication for an acute vascular      intervention as evidenced by the arteriogram.  I will initiate a      vasculitis workup with P-ANCA, C-ANCA and a C reactive protein.  We      will treat the patient empirically with antihistamines and high      dose steroids.  I will discuss the possible  need for      anticoagulation with hand surgery.  I am currently awaiting a      return phone call from their service at present.  We will monitor      the patient  inhouse and put her in a step down bed so that her      extremity can be followed very closely.  Acute changes in the      appearance of the extremity will require acute rapid followup re-      evaluation.  2. Unrelenting pain secondary to above.  The patient has a high      tolerance for pain medications as she uses hydromorphone and      Percocet on a regular basis at home.  She is in severe pain from      the decreased perfusion of her left hand.  I will place the patient      on Dilaudid PCA and we will monitor the patient's symptoms very      closely.  3. Hypertension - at the present time the patient's blood pressure is      not well controlled.  It is very likely that there      is a component of pain associated with this.  We will adjust her      pain control and monitor her blood pressure closely.  4. Macrocytosis - We will check a B12 level, folic acid level and      initiate further evaluation as indicated.      Lonia Blood, M.D.  Electronically Signed     JTM/MEDQ  D:  09/13/2008  T:  09/13/2008  Job:  [id number]   cc:   [facility]

## 2011-01-07 NOTE — Discharge Summary (Signed)
NAMEJAWANNA, DYKMAN              ACCOUNT NO.:  [id number]   MEDICAL RECORD NO.:  [id number]          PATIENT TYPE:  INP   LOCATION:  3037                         FACILITY:  [facility]   PHYSICIAN:  Richarda Overlie, MD       DATE OF BIRTH:  Oct 30, 1955   DATE OF ADMISSION:  09/13/2008  DATE OF DISCHARGE:  09/15/2008                               DISCHARGE SUMMARY   DISCHARGE DIAGNOSES:  1. Suspected digital ischemia of the left index finger now on      anticoagulation.  2. Traumatic injury to the left index finger.  3. History of chronic narcotic dependence.  4. History of chronic abdominal pain.  5. Hypertension.   SUBJECTIVE:  This is a 55 year old female with no prior history of  vascular disease or rheumatologic disorders who presented to the ER at  [facility] after 72 hours after a traumatic injury to her left index  finger at the skilled nursing facility.  The patient suffered immediate  pain, but no swelling or erythema of the hand.  The next morning the  patient found her hand to be very painful without any appreciable  discoloration.  The subsequent day the patient found her index finger to  have bluish discoloration , and presented to the [facility] ER for  evaluation.  At that point she was given an antibiotic and sent home.  The pain continued and the patient presented to [facility] emergency  room where she was evaluated by a vascular surgeon who spoke to Dr.  Hart Rochester in [facility].  The patient was transferred to [facility] for an  angiogram by interventional radiology, and it was suggested that the  patient could have vasculitis.  The patient was sent to the ER for  further evaluation.  In the ER the patient was seen by a hand surgeon,  Dr. Bradly Bienenstock from [facility] who advised admission to  the medical service for evaluation of vasculitis.   HOSPITAL COURSE:  1. Left index finger discoloration thought to be secondary to      vasculitis with decreased blood  flow on the angiogram.  The patient      also had an MRI of her hand which showed soft tissue edema, no bony      abnormality.  There was no clear indication for acute vascular      intervention per Dr Hart Rochester, who I discussed the case with, and      therefore, no formal vascular consultation was obtained.  The      patient was started on a heparin drip for anticoagulation and high      dose IV Solu-Medrol.  A P-ANCA, C-ANCA were sent and are pending at      this time.  A C-reactive protein and ESR were also sent.  However,      these were nearly normal, and therefore there is a low suspicion      for vasculitis.  The patient was also started on a Dilaudid drip      for excruciating pain.  However, she was transitioned to p.o.  OxyContin and Percocet subsequently, and was advised to continue      with that.  She was also transitioned to Lovenox and Coumadin, and      recommended that anticoagulation be continued for at least 3      months.  In case the patient had any underlying ischemia      contributing to her symptoms, the anticoagulation can be extended      indefinitely.  If the patient has similar symptoms in the digits of      her hand or her toes, then at that point rheumatological workup      would be essential.  I, however, would advise that the patient be      provided referral to see a rheumatologist on an outpatient basis by      the primary care Kashif Pooler, Dr. Hyacinth Meeker.  The patient to have an INR      checked on the 25th of this month at Dr. Rondel Baton office.  She has      been provided a prescription for Lovenox which she will continue      for another 5 days.The patient states that her anticoagulation can      be monitored by her primary care Haylynn Pha, Dr. Hyacinth Meeker.  2. Pain control.  The patient was transitioned from a Dilaudid PCA to      p.o. OxyContin.  She will continue with her Percocet q.6 p.r.n. for      breakthrough pain.  She will also continue with prednisone by       mouth.  3. Hypertension, well-controlled.   DISPOSITION:  The patient has been seen by Dr. Bradly Bienenstock during her  stay, and at that point he does not recommend any surgical intervention.  The patient's arteriogram was also reviewed by Dr. Hart Rochester, vascular  surgeon, who feels that no vascular surgery intervention is required at  this point.  I also spoke to him personally on the day of the patient's  discharge.  The patient will be discharged.  The patient was instructed  regarding side effects of Lovenox and Coumadin. Instructions for Lovenox  teaching and dietary restrictions with Coumadin were provided prior to  discharge.  The patient to return to work on October 02, 2008.   DISCHARGE MEDICATIONS:  1. Percocet 10/325 p.o. q.6 hours p.r.n., (20 tablets provided).  2. Coumadin 5 mg p.o. daily.  3. OxyContin 20 mg p.o. q.12 hours, (20 tablets provided).  4. Lovenox 75 mg subcu q.12 for 7 days.  5. Prednisone 10 mg tablets, 5 tablets for 4 days, 4 tablets for 4      days, 3 tablets for 4 days, 2 tablets for 4 days, and 1 tablet for      4 days, and then stop.  6. Lisinopril/hydrochlorothiazide 20/25 one tablet p.o. daily.  7. Ambien 10 mg as needed.  8. Percocet p.r.n.  9. Dilaudid 2 mg p.o. q.6 hours p.r.n.   FOLLOWUP APPOINTMENTS:  1. With Dr. Hyacinth Meeker at [facility] in 5-7 days.  2. INR checked with results to Dr. Hyacinth Meeker on the 21st.  3. Dr. Bradly Bienenstock, phone number [phone] in 2 weeks.  Patient to      call and set up this appointment.      Richarda Overlie, MD  Electronically Signed     NA/MEDQ  D:  09/15/2008  T:  09/15/2008  Job:  [id number]   cc:   Madelynn Done, MD  Hyacinth Meeker, M.D.

## 2011-01-10 NOTE — H&P (Signed)
[facility]  Patient:    Theresa Carey, Theresa Carey                       MRN: [id number] Proc. Date: 02/28/01 Adm. Date:  [id number] Attending:  Donnetta Hutching CC:         Barry Dienes. Eloise Harman, M.D., [facility]  Velora Heckler, M.D., [facility]   History and Physical  DATE OF BIRTH:  1956/06/25  CHIEF COMPLAINT:  Nausea and vomiting for three days.  HISTORY OF PRESENT ILLNESS:  Ms. Katura Eatherly is a 55 year old white female who was first seen by me in January of 2002 for similar symptoms when she was admitted to the hospital with intractable nausea and vomiting.  Patient was hospitalized for three days and supported with IV fluids.  Her symptoms seem to resolve on their own.  Patient was in her usual state of health till about three days ago when she had a [place] meal at [employer] along with a glass of wine and returned home.  Within the hour, she had severe nausea and vomiting.  This was not associated with abdominal pain.  The vomiting was projectile in nature.  There is no history of hematemesis or coffee-grounds emesis.  She denies history of constipation or diarrhea.  Her stools have been brown in color.  There is no history of BRBPR or melena.  She denies fevers at home but did have a low-grade fever on arrival to the ER.  She was evaluated and an NG tube was placed for contrast and a CT scan was done after abdominal films.  The patient says she has no cardiorespiratory or genitourinary complaints at this time.  She does have a history of hypertension and has been on Prinivil and HCTZ for several years.  She has had over 15 to 20 episodes of vomiting every day since February 26, 2001.  As her symptoms were not improving, she presented to the ER for further evaluation.  Patient denies any muscle, cerebrovascular or allergic problems.  ALLERGIES:  IBUPROFEN which causes hives.  MEDICATIONS: 1. Prinivil 5 mg per day. 2.  HCTZ 25 mg per day.  PAST MEDICAL HISTORY: 1. Vaginal hysterectomy for carcinoma in situ of the cervix in 1999 and a    tubal ligation in 1990. 2. Tobacco abuse for 25 years. 3. Hypertension since age 55. 4. Chronic low back pain. 5. History of condyloma acuminata. 6. History of laparoscopic cholecystectomy in December of 2001 by    Dr. Velora Heckler.  FAMILY HISTORY:  Several family members have hypertension.  There is no family history of cancer or diabetes.  SOCIAL HISTORY:  She is a Engineer, civil (consulting) at [employer].  She is single.  She has no children.  She smokes a half pack per day for the last 25 years and has an occasional alcoholic beverage.  She denies any use of street drugs.  REVIEW OF SYSTEMS: 1. Nausea and vomiting. 2. No abdominal pain. 3. No history of BRBPR or melena. 4. Patient denies problems with constipation or diarrhea.  PHYSICAL EXAMINATION:  GENERAL:  General physical examination reveals a middle-aged white female with an NG tube in place.  VITAL SIGNS:  Temperature 100.4, pulse 106 per minute, respiratory rate 18, blood pressure 160/110.  NECK:  Supple.  CHEST:  Clear to auscultation.  S1 and S2 regular.  No murmur, rub or gallop, rales, rhonchi or wheezing.  ABDOMEN:  Soft, nondistended,  with decreased bowel sounds.  No hepatosplenomegaly.  No masses palpable.  RECTAL:  Good sphincter tone.  Guaiac-positive brown stools.  No other masses palpable on digital examination.  LABORATORY EVALUATION:  WBC of 13,500, hemoglobin 14.6, hematocrit 42.6, platelets 358,000, ANC of 9.7.  Lipase 9, amylase 15, calcium 9, ALT 23, AST 19, alkaline phosphatase 105, total bilirubin 1.3, albumin 4.4, total protein 7.5, sodium 137, potassium 2.7, chloride 100, CO2 31, BUN 22, creatinine 0.8, glucose 117.  Abdominal film showed adequate levels of the small bowel without dilated loops.  CT scan of the abdomen and pelvis with contrast showed no acute  abnormalities. There was no evidence of bowel obstruction or dilated loops of bowel on CT.  ASSESSMENT AND PLAN: 1. Question viral syndrome with fever and an elevated white count.  Will    monitor the patient on intravenous fluids and hold off on starting    antibiotics at this time. 2. Question small bowel obstruction after a laparoscopic cholecystectomy.  A    nasogastric tube will be placed on low intermittent suction and serial    abdominal films will be done to further evaluate her symptoms. 3. Guaiac-positive stools.  Patient will need a workup prior to discharge; I    have discussed this with her. 4. I will contact Dr. Darnell Level and Dr. Barry Dienes. Eloise Harman to make them    aware of this admission. DD:  02/28/01 TD:  03/01/01 Job: [id number]

## 2011-01-10 NOTE — Op Note (Signed)
[place]  Patient:    Theresa Carey, Theresa Carey                       MRN: [id number] Proc. Date: 08/07/00 Adm. Date:  [id number] Attending:  Bonnetta Barry CC:         Barry Dienes. Eloise Harman, M.D.  Tera Mater. Evlyn Kanner, M.D.   Operative Report  PREOPERATIVE DIAGNOSES:  Biliary colic, gallbladder polyps.  POSTOPERATIVE DIAGNOSES:  Biliary colic, gallbladder polyps.  PROCEDURE:  Laparoscopic cholecystectomy with intraoperative cholangiography.  SURGEON:  Velora Heckler, M.D.  ASSISTANT:  Chevis Pretty, M.D.  ESTIMATED BLOOD LOSS:  Minimal.  PREPARATION:  Betadine.  COMPLICATIONS:  None.  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  The patient has undergone workup including ultrasound at [facility], which demonstrated multiple polyps within the gallbladder.  No gallstones were seen. No biliary dilatation was documented.  The patient now comes to surgery for empiric laparoscopic cholecystectomy for gallbladder polyp and biliary dyskinesia.  DESCRIPTION OF PROCEDURE:  The procedure was done in OR #17 at the [facility].  The patient was brought into the operating room and placed in the supine position on the operating room table.  Following administration of general anesthesia, the patient was prepped and draped in the usual strict aseptic fashion.  After ascertaining an adequate level of anesthesia had been obtained, an infraumbilical incision was made at the site of an old surgical wound with a #15 blade.  Dissection was carried down through the subcutaneous tissues.  Fascia was incised in the midline, and the peritoneal cavity was entered cautiously.  An 0 Vicryl pursestring suture was placed in the fascia.  A Hasson cannula was introduced under direct  vision and secured with a pursestring suture.  The abdomen was insufflated with carbon dioxide.  Laparoscope was introduced under direct vision and the abdomen explored.  Exploration reveals grossly normal peritoneal cavity.  Operative ports are placed along the right costal margin in the midline, midclavicular line, and anterior axillary line.  Fundus of the gallbladder is grasped and retracted cephalad.  The gallbladder is moderately intrahepatic, small, contracted.  There are no adhesions.  Dissection is begun at the neck of the gallbladder.  The cystic duct is dissected out along its length.  A clip is placed at the neck of the gallbladder.  Cystic duct is incised, and clear bile emanates from the cystic duct.  A Cook cholangiography catheter is introduced through a stab wound in the right upper quadrant.  It is inserted into the cystic duct.  A clip is used to secure the catheter.  Using C-arm fluoroscopy, real-time cholangiography is performed.  There is rapid filling of the main common bile duct.  There is reflux into both the right and left ductal systems in the liver.  There is free flow of contrast into the duodenum without obstruction or filling defect.  Representative photographs are made for the medical record.  The clip is removed and the catheter withdrawn.   The cystic duct is triply clipped and divided.  Cystic artery is dissected out along its length, doubly clipped, divided.  The gallbladder is then excised from the gallbladder bed using the hook electrocautery for hemostasis.  Gallbladder is completely excised and withdrawn from the umbilical port without difficulty. It is submitted  to pathology for review.  Right upper quadrant is irrigated with warm saline, and good hemostasis is obtained.  Saline is evacuated. Pneumoperitoneum is released, and ports are removed under direct vision.  All four port sites are anesthetized with local anesthetic.  Vicryl 0  pursestring suture is tied securely at the umbilicus.  All four surgical wounds are closed with interrupted 4-0 Vicryl subcuticular sutures.  Wounds are washed and dried, and benzoin and Steri-Strips are applied.  Sterile gauze dressings are applied.  The patient is awakened from anesthesia and brought to the recovery room in stable condition.  The patient tolerated the procedure well. DD:  08/07/00 TD:  08/07/00 Job: [id number]

## 2011-01-10 NOTE — H&P (Signed)
NAMEMarland Kitchen  Theresa Carey, Theresa Carey                          ACCOUNT NO.:  [id number]   MEDICAL RECORD NO.:  [id number]                   PATIENT TYPE:  INP   LOCATION:  0104                                 FACILITY:  [facility]   PHYSICIAN:  Barry Dienes. Eloise Harman, M.D.            DATE OF BIRTH:  January 25, 1956   DATE OF ADMISSION:  11/20/2003  DATE OF DISCHARGE:                                HISTORY & PHYSICAL   CHIEF COMPLAINT:  Nausea, vomiting and fever.   HISTORY OF PRESENT ILLNESS:  The patient is a 55 year old white female with  complaints of nausea, bilious vomiting and fever, which started last  evening.  She has had multiple episodes of vomiting, and has had no food or  fluid intake today.  She presented to the emergency room for evaluation.   In the emergency room she was treated with IV fluids, morphine and Zofran,  and notes her symptoms are much improved.  She has had similar episodes of  nausea, vomiting and fever approximately once per month with the last  admission in September 2004.   PAST MEDICAL HISTORY:  1. Hypertension.  2. Low-back pain.  3. Insomnia.  4. Chronic tobacco use.  5. Depression.  6. Uterine cancer.  7. Severe nausea and vomiting that started in December 2001 with recurrent     episodes.  8. Anemia with January 2002 laboratory without workup unremarkable.  9. Right upper quadrant pain that is intermittent with past workup     including; September 2002 MRCP negative, December 2002 gastric emptying     study normal, May 2004 CT scan of the abdomen normal, September 2004     admission for nausea, vomiting and fever, unremarkable, and test of     porphyrins negative.  10.      The patient also had in July 2002 a colonoscopy; unremarkable.   MEDICATIONS:  Medications prior to admission:  1. Lisinopril HCT 10/12.5 1 tab p.o. daily.  2. Vicodin 5/500 1 tab p.o. q.i.d. p.r.n. pain.  3. Ambien 10 mg 1 tab p.o. q.h.s. p.r.n.  4. In February 2005 she was treated with  Zithromax.  5. The patient also takes Zofran p.r.n. nausea, which she recently ran out     of.   ALLERGIES:  MOTRIN has been associated with edema and hives.   PAST SURGICAL HISTORY:  1. September 2000 total vaginal hysterectomy for cervical carcinoma in situ.  2. December 2001 laparoscopic cholecystectomy.   FAMILY HISTORY:  Family history notable for close relatives with  hypertension in a grandmother with diabetes mellitus, Type II.   SOCIAL HISTORY:  The patient is divorced and has no children.  She has  ongoing tobacco use of 1.5 packs per day for many years and no history of  alcohol abuse.  She works as an Public house manager at a Nurse, adult home.   REVIEW OF SYSTEMS:  The patient has had recent mild rhinitis.  She  denies  headache, stiff neck, sore throat, productive cough, shortness of breath,  diarrhea, constipation, rectal bleeding, and dysuria or frequency.   PHYSICAL EXAMINATION:  VITAL SIGNS:  Initial vital signs included a  temperature of 101.8 with pulse 113, respirations 20 and blood pressure  168/105.  Most recent vital signs include a temperature of 100.6 with a  pulse of 103 and blood pressure 121/80.  Pulse oxygen saturation level is  97% on room air.  GENERAL APPEARANCE:  In general she is a well-nourished, well-developed  white female who looks somewhat fatigued.  She is otherwise in no apparent  distress.  HEENT:  Exam is significant for oral mucosa.  NECK:  Neck is supple and without  jugular venous distention or carotid  bruits.  CHEST:  Chest is clear to auscultation.  HEART:  Heart has a regular rate and rhythm, S1 and S2 are brisk without  murmur, gallop or rub.  ABDOMEN:  Abdomen has normal bowel sounds with no hepatosplenomegaly or  tenderness.  EXTREMITIES:  Extremities are without cyanosis, clubbing or edema.  NEUROLOGIC EXAMINATION:  The patient is alert and oriented times three with  a normal affect and shows a nonfocal exam.   LABORATORY STUDIES:  White  blood cell count 10.6, hemoglobin 14, hematocrit  42 and platelet count 392,000 with 79% neutrophils.  Serum sodium 138,  potassium 3.1, chloride 99, CO2 28, BUN 18, creatinine 0.9, and glucose 113.  Serum albumin 4.8, AST 19, ALT 20, alkaline phosphatase 95, and total  bilirubin 1.0.  Urinalysis; nitrite negative with bacteria rare.   IMPRESSION AND PLAN:  1. Nausea, vomiting and fever.  This is of uncertain cause and is most likely due to a viral syndrome.  I  doubt that she has an intermittent biliary system disease given her  extensive workup to date.   I plan on administering intravenous fluids to replace that, which she has  lost and continue antiemetics and pain medications as needed.  I plan to  recheck a comprehensive metabolic panel in the morning.   1. Hypertension.  This is well-controlled in lisinopril HCT.   1. Depression, stable off her SSRI treatment.                                               Barry Dienes Eloise Harman, M.D.    DGP/MEDQ  D:  11/20/2003  T:  11/21/2003  Job:  [id number]   cc:   Geoffry Paradise, M.D.  [address]  [place]  [place] [address]  Fax: [phone]   Anselmo Rod, M.D.  [address]  [address]  [place]  [place] [address]  Fax: [phone]   Wilhemina Bonito. Marina Goodell, M.D. [facility]   Ronald L. Ovidio Hanger, M.D.  [address]  [place]  [place] [address]  Fax: [phone]   Kirtland Bouchard, M.D.  [facility]

## 2011-01-10 NOTE — Discharge Summary (Signed)
[facility]  Patient:    Theresa Carey, Theresa Carey                       MRN: [id number] Adm. Date:  [id number] Disc. Date: [id number] Attending:  Charna Elizabeth Dictator:   Leory Plowman, M.D. CC:         Theresa Carey, M.D.  Theresa Carey, M.D.  Barry Dienes Theresa Carey, M.D.   Discharge Summary  DISCHARGE DIAGNOSES: 1. Recurrent nausea and vomiting, resolved. 2. Hypokalemia, resolved. 3. Guaiac positive stool. 4. Hypertension. 5. Status post vaginal hysterectomy for cervical carcinoma in situ. 6. History of tobacco abuse. 7. Status post cholecystectomy in December 2001.  DISCHARGE MEDICATIONS: 1. Compazine 10 mg one tablet p.o. q.6h. p.r.n. nausea or vomiting. 2. Pepcid 20 mg one tablet p.o. b.i.d. 3. Prinivil 5 mg one tablet p.o. q.d. 4. Hydrochlorothiazide 25 mg one tablet p.o. q.d.  FOLLOW-UP:  Patient is scheduled to follow-up with Dr. Anselmo Rod on March 05, 2001 for EGD, colonoscopy, and possible small bowel follow through to both evaluate her heme positive stool and look for possible obstructive causes for her recurrent episodes of nausea and vomiting.  CONSULTATIONS:  General surgery, Dr. Carman Ching.  HISTORY OF PRESENT ILLNESS:  [name] is a 55 year old woman admitted on February 28, 2001 with a three day history of severe nausea and vomiting unassociated with abdominal pain or significant diarrhea.  Of note is that she has had four prior episodes similar to this, one of which required hospitalization in January 2002.  She is admitted for IV fluid hydration and further evaluation of her nausea and vomiting as she is not tolerating any oral intake.  ALLERGIES:  IBUPROFEN causes hives.  MEDICATIONS: 1. Prinivil 5 mg q.d. 2. Hydrochlorothiazide 25 mg q.d.  PAST MEDICAL HISTORY:  Please see discharge diagnoses.  FAMILY HISTORY:  Please see admission history and physical.  SOCIAL HISTORY:  Please see admission history and  physical.  PHYSICAL EXAMINATION:  Please see admission history and physical for detailed physical examination.  VITAL SIGNS:  Note temperature 100.4, pulse 106, respiratory rate 18, blood pressure 160/110.  ABDOMEN:  Soft and nondistended with decreased bowel sounds.  No masses were palpable.  RECTAL:  Guaiac positive brown stool.  LABORATORIES:  White blood cell count 13.5, hemoglobin 14.6, platelets 358,000.  Lipase and amylase were normal as were ALT, AST, alkaline phosphatase, and total bilirubin.  Electrolytes were significant for potassium of 2.7.  Abdominal film on admission showed air fluid levels in the descending colon.  HOSPITAL COURSE  #1 - RECURRENT NAUSEA AND VOMITING:  Patient was admitted and an NG tube was placed.  Patient was placed n.p.o. and was given IV fluid hydration.  Her nausea and vomiting were also treated with IV Phenergan.  On the morning of July 8 patients NG tube was removed.  She had several episodes of emesis, yet was able to tolerate clear liquids by the evening of July 8.  On the morning of July 9 she was able to tolerate a full liquid diet without further emesis. Note that surgical consultation was obtained and the patient was followed throughout hospitalization.  She is scheduled for EGD, colonoscopy, and possible small bowel follow through on March 05, 2001 to rule out obstructive causes of her recurrent severe nausea and vomiting.  #2 - HYPOKALEMIA:  Patients potassium was supplemented with potassium in her IV fluids.  On July 8 her potassium was 3.5 and on July  9 her potassium was up to 3.7.  #3 - HEME POSITIVE STOOL:  Patient was noted to have heme positive stool on admission.  At that time her hemoglobin was noted to be 14.6.  With hydration her hemoglobin fell to 12.2 and was stable at 12.5 on the day of discharge. Patient is scheduled for EGD and colonoscopy as described above to further evaluate her heme positive stool.  She is also  placed on empiric protime pump inhibitor therapy for possible ulcerative disease.  DISCHARGE LABORATORIES:  Potassium 3.7.  Hemoglobin 12.5, platelet count 292,000, white blood cell count 12.5.  DISPOSITION:  Patient is discharged to home.  CONDITION ON DISCHARGE:  Markedly improved. DD:  03/02/01 TD:  03/02/01 Job: [id number]

## 2011-01-10 NOTE — H&P (Signed)
[facility]  Patient:    Theresa Carey, Theresa Carey                       MRN: [id number] Adm. Date:  [id number] Attending:  Carmelina Peal                         History and Physical  CHIEF COMPLAINT:  Nausea with multiple episodes of vomiting.  HISTORY OF PRESENT ILLNESS:  The patient is a 55 year old white female who beginning on December 24 started to have some gastrointestinal problems. On that day, she had multiple episodes of nausea and vomiting. She was treated in the emergency room with intravenous fluids and suppositories. Her symptoms improved somewhat. Today at approximately 0400 hours, she awoke with projectile vomiting. Throughout the day, she has had multiple (approximately 50-60 episodes) of bowel-like emesis. At the time of my evaluation, she complained of mild lightheadedness with moderate nausea and worsening of her chronic low back pain.  She was evaluated in the emergency room by a general surgeon who felt that she had a benign abdominal exam and did a CT scan of the abdomen and pelvis which was unremarkable. Her nausea persisted despite treatment with Zofran 4 mg IV.  PAST MEDICAL HISTORY:  Significant for hypertension, chronic low back pain, tobacco abuse, uterine carcinoma in situ, and insomnia.  MEDICATIONS PRIOR TO ADMISSION:  Vasotec and hydrochlorothiazide.  PAST SURGICAL HISTORY:  Significant for August 07, 2000 laparoscopic cholecystectomy, 1999 vaginal hysterectomy where uterine carcinoma in situ was detected, in 1990 bilateral tubal ligation.  FAMILY HISTORY:  The father is age 43 years and has hypertension. Mother is age 76 years and has hypertension. Sister age 57 years and has hypertension. She does not have any children.  SOCIAL HISTORY:  She is divorced, she is a Arts administrator at [employer]. She has tobacco abuse of approximately 1/2 pack per day for 25 years. She admits to small alcohol  use.  REVIEW OF SYSTEMS:  She has had an approximately 10 pound weight loss over the past month and currently has fever. She denies dyspnea, headache, shortness of breath, cough, abdominal pain, diarrhea, or dysuria. She has moderate nausea with multiple episodes of bilious vomiting today.  PHYSICAL EXAMINATION:  VITAL SIGNS:  Blood pressure 164/93, pulse 105, respiratory rate 20, temperature 101.3.  GENERAL:  She is a well-nourished, well-nourished white female in no apparent distress whose alert and oriented x 3.  HEENT:  Extraocular movements intact. Throat had bile stained tongue.  NECK:  Supple with full range of motion. There was no jugular venous distention or carotid bruit.  CHEST:  Clear to auscultation.  HEART:  Regular rate and rhythm, S1, S2 without murmur, gallop or rub.  ABDOMEN:  Had absent bowel sounds with no hepatosplenomegaly or tenderness. She refused a pelvic and rectal exam.  EXTREMITIES:  Without cyanosis, clubbing or edema.  NEUROLOGIC:  She is alert and oriented x 3. Cranial nerves 2-12 were normal. Sensory exam was grossly normal. Motor strength was 5/5 throughout. She had intact finger to nose testing bilaterally.  LABORATORY DATA:  White blood cell count 11, hemoglobin 14, hematocrit 39, 81% neutrophils. Platelet count was 413, serum sodium 143, potassium 3.1, chloride 108, CO2 28, BUN 18, creatinine 0.8, glucose 134, alkaline phosphatase 108, SGOT 28, SGPT 52, calcium 10.4, lipase 19, amylase 69.  CT scan of the abdomen and pelvis (without oral contrast with IV  contrast) showed that she was status post cholecystectomy but the hepatobiliary system was otherwise unremarkable. No evidence of a urinary tract calculus, obstruction or mass. No evidence of bile distention. Vascular calcification. Somewhat suboptimal study due to lack of oral contrast.  A chest x-ray PA and lateral was within normal limits. Urinalysis specific gravity greater than  1.040, pH 7.5, ketones 40, bacteria rare, white blood cells none.  IMPRESSION/PLAN: 1. Nausea and vomiting. Clinically, she appears to have a small bowel    obstruction versus viral gastroenteritis. Her CT scan was somewhat    suboptimal, however, it appears that the hepatobiliary tract and urinary    tract are not the source of her symptoms. I doubt that she has early    appendicitis or diverticulitis given her benign abdominal exam. Plan:    Continue Phenergan suppositories q.i.d. and intravenous fluids. We will    obtain a GI consult and start her on Protonix 40 mg IV q.d. 2. Low back pain. This is moderate and a chronic symptom for her. She will    be given low dose narcotic medication to relieve the symptoms. 3. Hypertension. This is under fair control on Vasotec and hydrochlorothiazide    which will be held now while she appears to be intravascularly depleted. DD:  08/31/00 TD:  08/31/00 Job: [id number]

## 2011-01-10 NOTE — Op Note (Signed)
NAMEMarland Kitchen  Theresa Carey, Theresa Carey              ACCOUNT NO.:  [id number]   MEDICAL RECORD NO.:  [id number]          PATIENT TYPE:  AMB   LOCATION:  ENDO                         FACILITY:  [facility]   PHYSICIAN:  Anselmo Rod, M.D.  DATE OF BIRTH:  [birth year]   DATE OF PROCEDURE:  11/01/2004  DATE OF DISCHARGE:  11/01/2004                                 OPERATIVE REPORT   PROCEDURE PERFORMED:  Colonoscopy with random biopsies.   ENDOSCOPIST:  Anselmo Rod, M.D.   INSTRUMENT USED:  Olympus video colonoscope.   INDICATION FOR PROCEDURE:  [age]-old white female with a history of  nausea, vomiting, abdominal pain and diarrhea; rule out colitis versus  microscopic colitis.   PREPROCEDURE PREPARATION:  Informed consent was procured from the patient.  The patient was fasted for 8 hours prior to the procedure and prepped with a  bottle of magnesium citrate and a gallon of GoLYTELY the night prior to the  procedure.   DESCRIPTION OF THE PROCEDURE:  She was sedated with an additional 20 mg of  Demerol and 4 mg of Versed after EGD was done.  Once the patient was  adequately sedated and maintained on low-flow oxygen and continuous cardiac  monitoring, the Olympus video colonoscope was advanced from the rectum to  the cecum.  Three small sessile polyps were removed by cone biopsy from the  rectum.  Random colon biopsies were done to rule out colitis versus  microscopic colitis.  The rest of the exam up to the terminal ileum was  unremarkable.  The patient tolerated the procedure well without immediate  complications.   IMPRESSION:  1.  Essentially unrevealing colonoscopy up to the terminal ileum, except for      3 small sessile polyps biopsied from the rectum.  2.  No masses or polyps seen.  3.  Normal-appearing terminal ileum.   RECOMMENDATIONS:  1.  Await pathology results.  2.  Continue a high-fiber diet with liberal fluid intake.  3.  Outpatient followup in the next 2 weeks for  further recommendations,      once the biopsy results have been procured.  4.  Repeat colonoscopy in the next 5 years, unless the patient develops any      abnormal symptoms in the interim.      JNM/MEDQ  D:  11/02/2004  T:  11/04/2004  Job:  [id number]   cc:   Geoffry Paradise, M.D.  [address]  [place]  [place] [address]  Fax: [phone]

## 2011-01-10 NOTE — Discharge Summary (Signed)
NAMEMarland Carey  [name], Carey                          ACCOUNT NO.:  [id number]   MEDICAL RECORD NO.:  [id number]                   PATIENT TYPE:  INP   LOCATION:  0251                                 FACILITY:  [facility]   PHYSICIAN:  Barry Dienes. Eloise Harman, M.D.            DATE OF BIRTH:  Oct 04, 1955   DATE OF ADMISSION:  11/20/2003  DATE OF DISCHARGE:  11/21/2003                                 DISCHARGE SUMMARY   PERTINENT FINDINGS:  The patient is a 55 year old white female with  complaint of nausea, fever, and vomiting, which started on the evening prior  to admission. She had many episodes of vomiting and has had no food or fluid  intake the day of admission. She has had episodes approximately once monthly  over the past year with an extensive workup by several GI doctors including  a specialist at the [facility] with no  specific etiology for these episodes found. Her past workups have included  CT scans of the abdomen and pelvis, several MRCPs, gastric emptying studies,  and an analysis of porphyrin.   In the emergency room she was treated with IV fluids, morphine, and Zofran,  which has been associated with improvement in her symptoms. She still has  mild nausea, but not as on admission.   PAST MEDICAL HISTORY:  Significant for hypertension, low back pain,  insomnia, tobacco use, depression, uterine carcinoma, anemia.   MEDICATIONS PRIOR TO ADMISSION:  1. Lisinopril HCT 10/12.5 one tablet daily.  2. Vicodin 5/500 four times daily as needed.  3. Ambien 10 mg q.h.s. p.r.n. pain.   PHYSICAL EXAMINATION:  VITAL SIGNS: Temperature 101.8, pulse 113,  respirations 20, blood pressure 168/105.  GENERAL:  She is a well-nourished, well-developed white female who looks  fatigued, but otherwise in no apparent distress.  HEENT:  Dry oral mucosa.  NECK: Without jugular vein distention, carotid bruit.  CHEST:  Clear to auscultation.  HEART:  Regular rate and  rhythm. S1 and S2 are present without murmur,  gallop, or rub.  ABDOMEN:  Normal bowel sounds with no hepatosplenomegaly or tenderness.  EXTREMITIES:  Without cyanosis, clubbing, or edema.  NEUROLOGIC:  Nonfocal.   LABORATORY DATA:  White blood cell count 10.6, hemoglobin 14, hematocrit 42,  platelet count 392. Serum sodium 138, potassium 3.1, chloride 99, CO2 28,  BUN 18, creatinine 0.9, glucose 113. AST 19, ALT 20, alkaline phosphatase  95, total bilirubin 1.0. Urinalysis was nitrite negative.   HOSPITAL COURSE:  The patient was admitted to a medical bed without  telemetry. She was given a high volume of IV fluids along with scheduled  dosing of Zofran and pain medicines as needed.   PROCEDURES PERFORMED:  None.   COMPLICATIONS:  None.   CONDITION ON DISCHARGE:  She no longer had any nausea or abdominal pain. She  slept well overnight.   DISCHARGE LABORATORY DATA:  Most recent laboratory tests included  a follow-  up WBC with a white blood cell count 9.9, hemoglobin 11, hematocrit 33,  platelet count 312, serum sodium 140, potassium 3.2, BUN 14, creatinine 1.7.  Alkaline phosphatase 54, SGOT 15.   DISCHARGE PHYSICAL EXAMINATION:  VITAL SIGNS:  Most recent vital signs  include a blood pressure of 177/77, pulse 75, respirations 20, temperature  98.8, pulse oxygen saturation 99% on room air.  CHEST:  Her chest exam remains clear.  HEART: Normal.  ABDOMEN:  Normal bowel sounds with no organomegaly.   PLAN:  The plan today will be to have her try to eat breakfast and if she is  able to tolerate breakfast she will be discharged to home on Zofran. Her  Zestoretic has been changed to Zestril to help normalize her serum potassium  levels and Toprol XL will be added to her regimen as her systolic blood  pressure remains moderately elevated.   DISCHARGE DIAGNOSES:  1. Nausea and vomiting.  2. Fever.  3. Hypertension, essential, uncontrolled.  4. Low back pain.  5. Insomnia.  6.  Tobacco use.  7. Depression.  8. History of uterine carcinoma.  9. Anemia.   DISCHARGE MEDICATIONS:  1. Lisinopril 10 mg one tab p.o. daily.  2. Vicodin 5/500 1 tab p.o. q.i.d. p.r.n. pain.  3. Ambien 10 mg 1 tab p.o. q.h.s. p.r.n. sleep.  4. Zofran 4 mg 1 tab p.o. b.i.d. p.r.n. nausea #8, refill 6.  5. Toprol XL 50 mg one-half tab p.o. q.p.m.   ACTIVITY:  She should stay at home today and rest and drink plenty of  fluids. She should be able to return to work on November 22, 2003.   DISCHARGE FOLLOW UP:  She is to call [facility] to arrange  a follow-up evaluation with Dr. Jacky Kindle in one to two weeks following  discharge. She is advised to call telephone number [phone] to schedule this  visit.                                               Barry Dienes Eloise Harman, M.D.    DGP/MEDQ  D:  11/21/2003  T:  11/21/2003  Job:  [id number]   cc:   Geoffry Paradise, M.D.  [address]  [place]  [place] [address]  Fax: [phone]   Wilhemina Bonito. Marina Goodell, M.D. [facility]   Ronald L. Ovidio Hanger, M.D.  [address]  [place]  [place] [address]  Fax: [phone]   Kirtland Bouchard, M.D.  [facility]

## 2011-01-10 NOTE — Discharge Summary (Signed)
[facility]  Patient:    Theresa Carey, Theresa Carey                       MRN: [id number] Adm. Date:  [id number] Disc. Date: [id number] Attending:  Garlan Fillers CC:         Anselmo Rod, M.D.  Velora Heckler, M.D.   Discharge Summary  HISTORY OF PRESENT ILLNESS:  The patient is a 55 year old white female who on August 17, 2000, had multiple episodes of nausea and vomiting, was treated in the emergency room with IV fluids and suppositories.  Her symptoms improved for a few days.  On the day of admission at approximately 0400 hours she awoke with vomiting and has had approximately 50 episodes of bilious-like emesis on the day of admission.  She complains of some lightheadedness with moderate nausea.  PAST MEDICAL HISTORY: 1. Hypertension. 2. Chronic low back pain. 3. Tobacco abuse. 4. Uterine cancer. 5. Insomnia.  MEDICATIONS PRIOR TO ADMISSION:  Vasotec and hydrochlorothiazide.  PAST SURGICAL HISTORY: 1. August 07, 2000, laparoscopic cholecystectomy. 2. In 1999, vaginal hysterectomy. 3. In 1990, bilateral tubal ligation.  ALLERGIES:  MOTRIN causes EDEMA AND URTICARIA.  PHYSICAL EXAMINATION:  VITAL SIGNS:  Blood pressure 164/93, pulse 105, respiratory rate 20, temperature 101.3.  GENERAL:  Well-nourished, well-developed white female in no apparent distress. Alert and oriented x 3.  HEENT:  Pupils are equal, round, and reactive to light and accommodation. Extraocular movements intact.  Throat had a bile-stained tongue.  NECK:  Supple.  Full range of motion.  CHEST:  Clear to auscultation.  HEART:  Regular rate and rhythm.  S1, S2.  Without murmur, gallop, or rub.  PELVIC:  Refused.  RECTAL:  Refused.  ABDOMEN:  Hypoactive bowel sounds with no hepatosplenomegaly or tenderness.  EXTREMITIES:  Without clubbing, cyanosis, or edema.  NEUROLOGIC:  Nonfocal.  LABORATORY DATA:  White blood cell count 11, hemoglobin 14, hematocrit  39, platelet count 413.  Serum sodium 143, potassium 3.1, chloride 108, CO2 21, BUN 18, creatinine 0.8, glucose 134, lipase 19, amylase 69, alkaline phosphatase 108, SGOT 28, SGPT 52, calcium 10.4.  Urinalysis:  Specific gravity greater than 1.040, pH 7.5, ketones 40, bacteria none, white blood cells none.  CT scan of the abdomen and pelvis without oral contrast showed normal hepatobiliary system with evidence of a cholecystectomy, no evidence of urinary tract calculus, obstruction, or mass, and some vascular calcification. Chest x-ray PA and lateral was within normal limits.  PROCEDURES:  CT scan of the abdomen and pelvis.  COMPLICATIONS:  None.  HOSPITAL COURSE:  Upon admission the patient had a nasogastric tube placed to decompress her abdomen.  Her nausea rapidly improved.  She was placed on IV fluids for several days, and gradually her symptoms improved.  She was seen by a GI consultant (Dr. Elsie Amis), who agreed with a conservative approach.  A general surgeon (Dr. Gerrit Friends) evaluated her and did not feel that her symptoms were related to her recent cholecystectomy.  By September 02, 2000, her nausea and vomiting resolved, and her nasogastric tube was removed.  She was able to eat without recurrence of her nausea and vomiting and was discharged the next day.  DISCHARGE DIAGNOSES: 1. Nausea and vomiting. 2. Hypokalemia. 3. Hypertension, essential, controlled. 4. Chronic low back pain. 5. Tobacco abuse. 6. History of uterine cancer. 7. Chronic insomnia.  DISCHARGE MEDICATIONS: 1. Protonix 40 mg p.o. q.d. for 30 days. 2. Xanax 0.5 mg p.o. b.i.d.  p.r.n. anxiety, #20. 3. Phenergan 25 mg p.o. t.i.d. p.r.n. nausea. 4. Vasotec and hydrochlorothiazide as her usual doses once daily. 5. Darvocet-N 100 1-2 tablets p.o. q.i.d. p.r.n. pain, #40, refills 0.  SPECIAL INSTRUCTIONS:  She was advised to stop by our office for Hemoccult cards and to have iron laboratory tests drawn to assess  her mild anemia.  FOLLOW-UP:  She was advised to have a follow-up evaluation with Dr. Eloise Harman in approximately two weeks from discharge, sooner if her nausea recurs. DD:  10/14/00 TD:  10/15/00 Job: [id number]

## 2011-01-10 NOTE — Discharge Summary (Signed)
NAMEMarland Kitchen  Theresa Carey, Theresa Carey              ACCOUNT NO.:  [id number]   MEDICAL RECORD NO.:  [id number]          PATIENT TYPE:  OBV   LOCATION:  5028                         FACILITY:  [facility]   PHYSICIAN:  Michelene Gardener, MD    DATE OF BIRTH:  [birth year]   DATE OF ADMISSION:  12/23/2008  DATE OF DISCHARGE:  12/25/2008                               DISCHARGE SUMMARY   DISCHARGE DIAGNOSES:  1. Abdominal pain.  2. Nausea and vomiting.  3. Hyponatremia.  4. Hypokalemia.  5. History of digital ischemia of left finger.  6. Vasculitis.  7. Tachycardia that resolved.  8. Hypertension.  9. Depression.   DISCHARGE MEDICATIONS:  New medications:  1. Phenergan 25 mg q.4 h. as needed.  2. Aspirin 81 mg p.o. once daily.   Old medications include all of her old medications which are:  1. Lisinopril 20 mg once a day.  2. Coumadin 5 mg on Saturday, Sunday, and Tuesday, and 4 mg the rest      of the week.  3. Prednisone 2.5 mg once a day.  4. Norvasc 5 mg once a day.  5. Lexapro 10 mg once a day.  6. OxyContin twice daily.   MEDICATIONS TO BE STOPPED:  Hydrochlorothiazide.   CONSULTATIONS:  None.   PROCEDURES:  None.   RADIOLOGY STUDIES:  Abdominal x-ray on Dec 23, 2008, showed no acute  problems.  Follow up with primary doctor within a week.   COURSE OF HOSPITALIZATION:  1. Abdominal pain.  This patient had chronic abdominal pain that      required frequent hospitalization in the past.  Her chronic      abdominal pain is secondary to chronic cholecystectomy that has      been performed a few years ago.  Since that time, she underwent      multiple evaluation with MRCPs, endoscopies, and different      modalities that did not specify a cause for her pain.  Admitted      overnight in the hospital for pain management and observation.  The      patient was treated symptomatically and she improved quick.  At the      time of discharge, she was back to her baseline and she did not      require  more pain medications.  2. Nausea and vomiting that has been ongoing problem when she      developed this abdominal pain, and again as mentioned above, she      had very big evaluation that did not reveal anything and even her      workup at this time has also been negative with lipase, amylase,      liver enzymes, and abdominal x-ray.  Again, the patient responded      very well to hydration and symptomatic treatment.  3. Hyponatremia that was corrected with normal saline.      Hydrochlorothiazide was continued because it felt to be      contributing to her hyponatremia and hypokalemia.  4. Hypokalemia.  Again, it was supplemented, and as mentioned above,  her hydrochlorothiazide was stopped.  5. Hypertension.  Hydrochlorothiazide was stopped.  The patient was      continued on lisinopril.  As a matter of fact, she did very well      and blood pressure has been stable in 1 medication during this      hospitalization.  6. Vasculitis.  The patient was continued on Coumadin in addition to      her prednisone.   Otherwise, medical conditions remained stable.  The patient was stable  during this hospitalization and was discharged home in stable condition.   Total assessment time is 40 minutes.      Michelene Gardener, MD  Electronically Signed     Michelene Gardener, MD  Electronically Signed    NAE/MEDQ  D:  01/17/2009  T:  01/18/2009  Job:  [id number]

## 2011-01-10 NOTE — Consult Note (Signed)
[facility]  Patient:    Theresa Carey, Theresa Carey Visit Number: [id number] MRN: [id number]          Service Type: MED Location: 3W 0357 02 Attending Physician:  Beatris Ship Dictated by:   Anselmo Rod, M.D. Consultation Date: 05/14/01 Admit Date:  05/14/2001   CC:         Barry Dienes. Eloise Harman, M.D.  Velora Heckler, M.D.   Consultation Report  GASTROENTEROLOGY CONSULTATION  DATE OF BIRTH: 2056/02/12  REASON FOR CONSULTATION:  Recurrent nausea and vomiting with low grade fever.  ASSESSMENT:  1. Recurrent nausea and vomiting. At this time, the patient has severe     diarrhea associated with her symptoms, rule out infectious etiology.     Patient has no evidence of obstruction. Small bowel follow-through is     normal.  2. History of gastroesophageal reflux disease, on Pepcid.  3. History of colonic polyps.  4. History of hypertension.  5. History of laparoscopic cholecystectomy in December of 2001 for     gallbladder polyp.  6. History of tubal ligation.  7. History of uterine cancer, status post a vaginal hysterectomy in the past.  8. History of tobacco abuse.  9. History of insomnia. 10. Recent hyperglycemia.  RECOMMENDATIONS: 1. Check stool study for C. difficile toxin, assay enteric toxin. 2. Discontinue NG now there has been no vomiting since 2 p.m. today. 3. Allow the patient some ice chips. 4. IV fluids to be continued for now. 5. Monitor serial electrolytes.  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 On all of these episodes, the patient had low grade fevers from 101 to 102. There is no history of blood or mucus in the stool. She denies any chills or rigors. Usually the symptoms tend to  occur when she has eaten out and on every occasion have occurred when she has had a meal either at a restaurant or local fast food place. She has had about 20 bowel movements since last night and about five bowel movements in the ER today. She denies any blood or mucus in stool. She was in her usual state of health until about 9 p.m. yesterday when the symptoms started. She had eaten some fried chicken from [employer] before her symptoms came on. She denies any chest pain or shortness of breath. There are no genitourinary complaints. She has had some low grade fever with a fever of 101.6 on admission and some right upper quadrant pain without any radiation.  There is no history of ulcers, jaundice, or colitis. She denies any muscle _______ vascular or neurologic problems.  ALLERGIES:  IBUPROFEN.  MEDICATIONS AT HOME:  Prinivil, HCTZ, and Pepcid.  PAST MEDICAL HISTORY:  See list above.  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 She smokes a half pack per day for over 25 years. She drinks an occasional alcoholic beverage. She denies any street drugs.  FAMILY HISTORY:  Noncontributory.  REVIEW OF SYSTEMS: 1. Nausea and vomiting. 2. Diarrhea. 3. No blood or mucus in the stool. 4. Low grade fevers.  PHYSICAL EXAMINATION:  GENERAL: Very pleasant, middle-aged white female in no acute distress lying comfortably in bed.  VITAL SIGNS:  Temperature 101.6 in the  morning on admission with a blood pressure of 152/109, pulse of 98 per minute, and respiratory rate of 20. The patient is afebrile at the present time.  HEENT:  AT, [place]. PERRLA, EOMI. Facial symmetry preserved. Oropharyngeal mucosa without exudate.  NECK:  Supple.  CHEST:  Clear to auscultation. Respirations regular. No murmur, rub, gallop, rales, rhonchi, or wheezing.  ABDOMEN:  Soft, nondistended with well-healed surgical scars from previous laparoscopic cholecystectomy  and tubal ligation. The patient has good bowel sounds. There is no evidence of hepatosplenomegaly. There is minimal right upper quadrant tenderness with no guarding, rebound, or rigidity.  RECTAL EXAMINATION: Good sphincter tone. Guaiac negative brown stools. No other masses palpable on digital exam.  EXTREMITIES:  Peripheral pulses normal.  CENTRAL NERVOUS SYSTEM:  Nonfocal.  LABORATORY EVALUATION:  White count of 7.4 with hemoglobin of 15.4, hematocrit 44.1, platelet count 334,000. The patient has 94% neutrophils. Lipase is 12. Sodium 138, potassium 3.4, chloride 102, CO2 22, glucose 168, BUN 18, creatinine 0.9, calcium 10, total protein 8.2, albumin 4.9, AST 22, ALT 18, alkaline phosphatase 106, total bili 1.2.  Small bowel follow-through done today was normal.  Considering this data, recommendations have been made. Further recommendation made when the patient is followed along with the medical service in the hospital. Dr. Melvia Heaps is on over the weekend and I will resume care if the patient is here after the weekend. Dictated by:   Anselmo Rod, M.D. Attending Physician:  Beatris Ship DD:  05/14/01 TD:  05/15/01 Job: [id number]

## 2011-01-10 NOTE — Discharge Summary (Signed)
[facility]  Patient:    Theresa Carey Visit Number: [id number] MRN: [id number]          Service Type: MED Location: 3W 0357 02 Attending Physician:  Beatris Ship Dictated by:   Pearletha Furl Jacky Kindle, M.D. Admit Date:  05/14/2001 Discharge Date: 05/18/2001                             Discharge Summary  DISCHARGE DIAGNOSES: 1. Nausea, vomiting, and diarrhea of unclear etiology. 2. Volume depletion. 3. Essential hypertension. 4. Remote cholecystectomy.  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  She has had an extensive GI history this past year dating back to December 2001, with initial presentation of right upper quadrant pain accompanied by nausea and vomiting and diarrhea, and subsequent laparoscopic cholecystectomy by Dr. Darnell Level.  She has had recurrent bouts of nausea and vomiting, the last of which required hospitalization in July under Anselmo Rod, M.D. care.  She had an extensive workup in the last several months to include upper GI endoscopy, colonoscopy, small-bowel follow-through, and CT scanning, all of which were unrevealing.  Every bout appears to be similar, consisting of fairly profound upper quadrant right-sided pain, nausea and vomiting which is copious and bilious, diarrhea and low-grade fevers.  Additionally, in between episodes she has had a chronic gnawing right upper quadrant pain which is present daily, and has had significant weight loss which has recently restabilized, but some 20 to 30 pounds below her baseline.  She is admitted at this time for hydration and further management.  For details, see my dictated history and physical dated Jan 11, 2001.  LABORATORY DATA:  CBC showed a hemoglobin of 15.4, hematocrit 44, white blood cell count 7.4, platelets 334,000, with  differential 94% segs, 3% lymphocytes, 4% monocytes.  Sedimentation rate is 9.  Chemistries:  Sodium 138, potassium 3.4, chloride 102, CO2 22, glucose 168, BUN 18, creatinine 0.9, calcium 10.0, protein 8.2, albumin 4.9.  Liver function testing was normal.  Albumin was 3.2 and 2.8, respectively.  Urinalysis revealed 7 to 10 red cells, 0 to 2 white cells.  Blood cultures x 2 were negative.  Stool white blood cells none. Urine culture revealed 100,000 colonies of Enterococcus.  Stool for routine pathogens was negative.  C. difficile was negative.  Stool for O&P negative. ANA pending.  Anti smooth muscle less than 1 to 20.  Acute abdominal series showed no evidence of obstruction or perforation, three small calcifications in the pelvis, no acute disease in the chest, previous cholecystectomy.  MRCP revealed normal non-contrast MRI appearing of the biliary tree and upper abdominal structures.  Small-bowel follow-through revealed some thickening of the jejunum, otherwise unremarkable.  HOSPITAL COURSE:  The patient was treated symptomatically with IV fluids, antiemetics, and gradually symptoms abated, the last of which to abate was her fever.  She did get treated with Tequin for a short course for Enterococcal urinary tract infection.  GI was consulted, raising the possibility of familial Mediterranean fever, but this was dispelled.  Infectious disease was consulted.  Recognizing that 50% of the causes of such presentation are not clear, but other considerations would be Crohns disease, relapsing polychondritis, Stills disease, hypersensitivity pneumonitis, and familial Mediterranean fever.  It was the feeling that given her normal white count and normal sedimentation rate, familial  Mediterranean fever is not likely, and given the fact that she is over 10 years of age and presentation is younger than 22, this is unlikely as well.  Nothing further was suggested beyond a possible trial of  colchicine for familial Mediterranean fever and/or exploring genetic markers.  From a surgical standpoint, small-bowel follow-through was felt to be negative, as was MRCP, and it was felt that there was nothing further surgically to be addressed.  In short, she was discharged in stable condition with an unclear diagnosis, to be followed up as an outpatient. Consideration for rheumatology consult and/or presentation to [facility] where the next presentation will be undertaken. Dictated by:   Pearletha Furl Jacky Kindle, M.D. Attending Physician:  Beatris Ship DD:  06/01/01 TD:  06/01/01 Job: [id number]

## 2011-01-10 NOTE — Procedures (Signed)
[place]. [facility]  Patient:    Theresa Carey, Theresa Carey                       MRN: [id number] Proc. Date: 03/24/01 Adm. Date:  [id number] Attending:  Charna Elizabeth CC:         Barry Dienes. Eloise Harman, M.D.  Velora Heckler, M.D.   Procedure Report  DATE OF BIRTH:  [birth year].  PROCEDURE:  Colonoscopy with biopsies.  ENDOSCOPIST:  Anselmo Rod, M.D.  INSTRUMENT USED:  Olympus video colonoscope.  INDICATION FOR PROCEDURE:  Guaiac-positive stool with recurrent bouts of nausea and vomiting in a 55 year old white female, etiology unclear.  The patient has had an EGD and a small bowel follow-through that have been unrevealing.  PREPROCEDURE PREPARATION:  Informed consent was procured from the patient. The patient was fasted for eight hours prior to the procedure and prepped with a bottle of magnesium citrate and a gallon of NuLytely the night prior to the procedure.  PREPROCEDURE PHYSICAL:  VITAL SIGNS:  The patient had stable vital signs.  NECK:  Supple.  CHEST:  Clear to auscultation.  S1, S2 regular.  No murmur, rub, or gallop, rales, rhonchi, or wheezing.  ABDOMEN:  Soft, nondistended, nontender, with normal bowel sounds.  DESCRIPTION OF PROCEDURE:  The patient was placed in the left lateral decubitus position and sedated with Demerol and Versed for the procedure. Once the patient was adequately sedate and maintained on low-flow oxygen and continuous cardiac monitoring, the Olympus video colonoscope was advanced from the rectum to the cecum with difficulty secondary to a large amount of residual stool in the colon.  Three small polyps were biopsied from the left colon for pathology.  Cold biopsy forceps were used.  No large masses or polyps were seen.  There was no evidence of diverticulosis.  The procedure was completed up to the cecum.  The ileocecal valve and appendiceal orifice were clearly visualized and photographed.  IMPRESSION: 1. Large  amount of residual stool in the colon.  Very small lesions could have    been missed. 2. Three small sessile polyps biopsied with the cold biopsy forceps from the    left colon.  RECOMMENDATIONS: 1. Await pathology results. 2. Repeat guaiacs on an outpatient basis. 3. Further recommendations made thereafter. DD:  03/24/01 TD:  03/25/01 Job: [id number]

## 2011-01-10 NOTE — Procedures (Signed)
[place]. [facility]  Patient:    Theresa Carey, Theresa Carey                       MRN: [id number] Proc. Date: 03/03/01 Adm. Date:  [id number] Disc. Date: [id number] Attending:  Charna Elizabeth CC:         Dr. Florina Ou, M.D.                           Procedure Report  DATE OF BIRTH:  1956/08/24.  REFERRING PHYSICIAN: 1. Dr. Ivery Quale. 2. Velora Heckler, M.D.  PROCEDURE PERFORMED:  Esophagogastroduodenoscopy with biopsies.  ENDOSCOPIST:  Anselmo Rod, M.D.  INSTRUMENT USED:  Olympus video panendoscope.  INDICATIONS FOR PROCEDURE:  Epigastric discomfort with recurrent nausea and vomiting requiring hospitalization on two occasions since her laparoscopic cholecystectomy in December 2001.  With recent guaiac positive stools and mild anemia, rule out peptic ulcer disease, esophagitis, gastritis etc.  PREPROCEDURE PREPARATION:  Informed consent was procuredfrom the patient. The patient was fasted for eight hours prior to the procedure.  PREPROCEDURE PHYSICAL:  The patient had stable vital signs. Neck supple. Chest clear to auscultation.  S1, S2 regular.  Abdomen soft with normal abdominal bowel sounds.  Epigastric tenderness on palpation with minimal guarding,  no rebound, no rigidity, no hepatosplenomegaly.  DESCRIPTION OF PROCEDURE:  The patient was placed in left lateral decubitus position and sedated with 50 mg of Demerol and 5 mg of Versed intravenously. Once the patient was adequately sedated and maintained on low-flow oxygen and continuous cardiac monitoring, the Olympus video panendoscope was advanced through the mouthpiece, over the tongue, into the esophagus under direct vision.  The entire esophagus appeared normal withoutevidence of ring stricture masses, lesions, esophagitis or Barretts mucosa.  The scope was then advanced to the stomach.  There was a small amount of liquid debris along the greater curvature.  A  hiatal hernia was seenon retroflexion.  There was patchy gastritis to the gastric mucosa, morein the proximal half of the stomach.  No ulcers, erosions, masses or polyps were seen.  There is some nodularity of the mucosa in the duodenal bulb with shaggy irregular mucosa biopsied for pathology. The small bowel and distal to bulb appeared normal. Biopsies were done from the duodenal bulb.  There was no outlet obstruction. The patient tolerated the procedure well without complications.  IMPRESSION: 1. Normal-appearingesophagus. 2. Hiatal hernia. 3. Patchy gastritis in the stomach. 4. Small amount of debris along the greater curvature. 5. Inflamed-lookingmucosa in the duodenal bulb biopsied for pathology.  RECOMMENDATION: 1. Await pathology results. 2. Small bowel follow-through to furtherevaluate the patients nausea and    vomiting. 3. Outpatient follow-up after a colonoscopy has been done.    DD:  03/03/01 TD:  03/03/01 Job: [id number]

## 2011-01-10 NOTE — H&P (Signed)
[facility]  Patient:    Theresa Carey, Theresa Carey Visit Number: [id number] MRN: [id number]          Service Type: MED Location: 1E 0104 01 Attending Physician:  Tobey Bride Dictated by:   Pearletha Furl Jacky Kindle, M.D. Admit Date:  05/14/2001                           History and Physical  CHIEF COMPLAINT:  Nausea, vomiting, and diarrhea.  HISTORY OF PRESENT ILLNESS:  Theresa Carey is a 55 year old white female well known to myself through our association at the [facility], presenting at this time with recurrent nausea, vomiting, and diarrhea.  The patient has had an extensive GI history this past year dating back to December of 2001 with the initial presentation of nausea, vomiting, and diarrhea and subsequent laparoscopic cholecystectomy by Velora Heckler, M.D.  She has had recurrent episodes of nausea and vomiting, the last of which resulting hospitalization in July under Theresa Carey care.  She presented with extensive nausea and vomiting with some diarrhea, as well as heme-positive stool.  She has had an extensive evaluation to include upper GI endoscopy, colonoscopy, and small bowel follow through and evidently in the past has also had some CT scanning of her abdomen.  She has had no pathology determined and has had no further symptoms along these lines in the last 60 days.  In between episodes, she does keep some low-grade upper quadrant, right-sided gnawing pain, which she relates chronic and difficult to characterize.  She did have a significant weight loss following her cholecystectomy, but has been relatively stable over the last 60-90 days.  She relates onset last evening at 9 p.m. of intensifying right upper quadrant pain.  Within one hour following this, onset of nausea, vomiting, and profuse diarrhea to the extent that she has had accidents in the bed.  She has had no bloody stools and these have been copious, watery, brown stools.   The vomiting has been bilious with no blood.  She has had no back pain, chest pain, or shortness of breath.  She has had no skin rashes, headaches, or mental status changes.  She presented to the emergency room during the early morning hours because of extensive vomiting and witnessed bilious vomiting.  She is being admitted for hydration and further management.  ALLERGIES:  IBUPROFEN causes hives.  MEDICATIONS: 1. Prinivil 5 mg daily. 2. Pepcid 20 mg p.o. b.i.d. 3. Ambien 5 mg p.o. q.h.s. p.r.n. 4. HCTZ 25 mg p.o. q.d.  PAST MEDICAL HISTORY:  Medical illnesses include essential hypertension, chronic low back pain, and prior condyloma acuminata.  PAST SURGICAL HISTORY:  Laparoscopic cholecystectomy in December of 2001, vaginal hysterectomy for carcinoma in situ of the cervix in 1999, and tubal ligation in 1990.  FAMILY HISTORY:  Remarkable for hypertension.  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  She is single.  She does not have children.  She smokes a half a pack a day and has a cumulative 15-pack-year history of smoking.  She has only social alcohol intake.  PHYSICAL EXAMINATION:  The temperature is 101.6 degrees, the blood pressure is 150/100, the pulse is 110 and regular, and the respiratory rate is 20 and unlabored.  GENERAL APPEARANCE:  The patient is supine and ill appearing.  No distress. NG tube in place.  HEENT:  Anicteric.  Good facial symmetry.  NECK:  Supple.  No JVD or bruits.  LUNGS:  Grossly clear.  CARDIOVASCULAR:  Regular rate and rhythm.  Tachy.  No murmur, gallop, rub, or heave.  ABDOMEN:  Soft and nontender.  Again, NG tube is in place.  Bowel sounds present.  No hepatosplenomegaly.  BACK:  No CVA or spinal tenderness.  EXTREMITIES:  No peripheral edema.  Calves soft and nontender.  Warm distally. Good pulses.  NEUROLOGIC:  Exam is nonfocal.  ASSESSMENT: 1. Recurrent nausea, vomiting, and diarrhea with  chronic right upper quadrant    symptoms despite extensive negative work-up, etiology unclear, but clearly    intensified post cholecystectomy. 2. Essential hypertension, stable. 3. Tobacco dependency.  PLAN:  The patient is admitted for IV fluids, bowel rest, analgesia, and further work-up to include immediate small bowel follow through with Gastrografin to rule out obstructive process.  Consideration for hepatobiliary scan next and further pending Dr. Henri Medal evaluation. Dictated by:   Pearletha Furl Jacky Kindle, M.D. Attending Physician:  Tobey Bride DD:  05/14/01 TD:  05/14/01 Job: [id number]

## 2011-01-10 NOTE — Consult Note (Signed)
[facility]  Patient:    Theresa Carey, Theresa Carey                       MRN: [id number] Proc. Date: 08/31/00 Adm. Date:  [id number] Attending:  Lauree Chandler CC:         Barry Dienes. Eloise Harman, M.D., [facility]  Catalina Lunger, M.D., [facility]   Consultation Report  DATE OF BIRTH:  09/21/1955.  REASON FOR CONSULTATION:  Nausea and vomiting since 4 a.m. this morning.  ASSESSMENT 1. Nausea and vomiting with no evidence of abdominal distention or    abdominal pain, ? gastric outlet obstruction, rule out ileus versus bowel    obstruction. 2. Fever with leukocytosis and left shift, ? viral syndrome. 3. Status post laparoscopic cholecystectomy for gallbladder polyp done on    August 07, 2000 by Dr. Velora Heckler.  No evidence of postoperative    complications per laboratories or CT. 4. Chronic low back pain. 5. History of uterine cancer, status post vaginal hysterectomy. 6. History of bilateral tubal ligation in the past. 7. History of tobacco abuse with half-pack per day for the last 25 years. 8. History of insomnia. 9. Hypertension.  RECOMMENDATIONS 1. Agree with NG tube placement, considering the multiple bouts of    large-volume emesis the patient has had since admission. 2. Supplement potassium and IV fluids as discussed with    Dr. Barry Dienes. [place]. 3. Abdominal films in a.m. to reassess patients symptoms. 4. Monitor vital signs and CBCs. 5. Hold off on broad-spectrum antibiotics for now. 6. Agree with IV Protonix for now. 7. Would refrain from using a large amount of pain medications for fear of    worsening the patients ileus of it is indeed the problem.  DISCUSSION:  Theresa Carey is a perplexing 55 year old white female who has had multiple medical problems as listed above, who presents with second episode of severe nausea and vomiting for over 16 hours.  She had a similar episode Christmas Eve  when she came to the [facility] and was treated with IV fluids at that time.  No abdominal films were done.  The patient was sent home on some antiemetics and her symptoms seem to resolve on their on in two days; however, when she was getting ready for work this morning, she developed severe nausea and vomiting and has had ongoing symptoms.  She has had bilious vomitus with no hematemesis or coffee-grounds emesis.  She denies melena.  She denies any abdominal pain.  She has had fevers without chills or rigors.  She denies any cardiorespiratory or genitourinary complaints at this time.  There is no vascular, allergic or musculoskeletal problems except for some low back pain.  ALLERGIES:  Patient is allergic to MOTRIN which causes hives.  MEDICATIONS AT HOME:  Vasotec and HCTZ for hypertension.  PAST MEDICAL HISTORY:  See list above.  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  She has smoked a half pack per day for the last 25 years and drinks alcohol occasionally on social gatherings.  She denies the use of street drugs.  She has no children.  She has one sister who is healthy except for hypertension.  FAMILY HISTORY:  The patients parents are healthy except for hypertension. Her father is 45.  Mother is 47.  Her sister is 79 and has hypertension. There is no family history  of cancer, diabetes or heart disease.  REVIEW OF SYSTEMS 1. Nausea and vomiting. 2. No abdominal pain, weight loss, hematemesis, coffee-grounds emesis, melena    or hematochezia. 3. Patient denies any abdominal pain. 4. Low back pain. 5. Fever without chills or rigors. 6. No genitourinary or cardiorespiratory problems.  PHYSICAL EXAMINATION  GENERAL:  Patient seems to be a very anxious middle-aged white female in slight distress because of her ongoing retching.  VITAL SIGNS:  Temperature of 101.3, blood pressure 177/64, respiratory rate of 22  per minute, pulse of 112 per minute.  HEENT:  Normocephalic, atraumatic head.  PERRLA.  Extraocular muscles are intact.  Facial symmetry is preserved.  NECK:  Supple.  No JVD, thyromegaly or lymphadenopathy.  CHEST:  Clear to auscultation.  S1 and S2 regular.  No murmur, rub or gallop, rales, rhonchi or wheezing.  ABDOMEN:  Soft with small surgical scar at the umbilicus from a tubal ligation.  Nondistended, nontender, with no bowel sounds and no hepatosplenomegaly appreciated.  RECTAL:  Examination was refused by the patient.  LABORATORY EVALUATION:  Lipase of 19, amylase of 63 and a white count of 11,000 with 81% neutrophils, ANC 8.9, hemoglobin 14, hematocrit 39.2, MCV 90.2, platelet count 413,000, sodium 143, potassium 3.1, chloride 108, CO2 21, glucose 134, BUN 18, creatinine 0.8, total bilirubin 0.6, alkaline phosphatase 108, ALT 52, AST 20, total protein 7.7, albumin 4.7, calcium 10.4.  Urinalysis reveals specific gravity of 1.040 with pH of 7.5, 40 mg/dl of ketones, small amount of blood and 30 mg/dl of protein.  Urine was negative for nitrites and leukocytes.  There were less than 5 rbcs and a few squamous epithelial cells.  After reviewing the patients CT scan at length with ______ in radiology, the patients symptoms seem to be somewhat unexplained at this time; however, recommendations have been made and I will continue to follow the patient with Dr. Eloise Harman and the surgical service and made further recommendations as need arises. DD:  08/31/00 TD:  09/01/00 Job: [id number]

## 2011-01-10 NOTE — Op Note (Signed)
NAMEMarland Kitchen  MCKAILA, DUFFUS              ACCOUNT NO.:  [id number]   MEDICAL RECORD NO.:  [id number]          PATIENT TYPE:  AMB   LOCATION:  ENDO                         FACILITY:  [facility]   PHYSICIAN:  Anselmo Rod, M.D.  DATE OF BIRTH:  24-Feb-1956   DATE OF PROCEDURE:  11/01/2004  DATE OF DISCHARGE:  11/01/2004                                 OPERATIVE REPORT   PROCEDURE PERFORMED:  Esophagogastroduodenoscopy with biopsies.   ENDOSCOPIST:  Anselmo Rod, M.D.   INSTRUMENT USED:  Olympus videopanendoscope.   INDICATION FOR THE PROCEDURE:  A 55 year old white female with a history of  nausea, vomiting, abdominal pain, and diarrhea.  Rule out celiac sprue,  ulcer disease, etc.   PROCEDURE PREPARATION:  Informed consent was procured from the patient.  The  patient was fasted for eight hours prior to the procedure.   PREPROCEDURE PHYSICAL:  VITAL SIGNS:  Stable.  NECK:  Supple.  CHEST:  Clear to auscultation.  S1 and S2 regular.  ABDOMEN:  Soft with normal bowel sounds.   DESCRIPTION OF THE PROCEDURE:  The patient was placed in the left lateral  decubitus position and sedated with 80 mg of Demerol and 8 mg of Versed in  slow incremental doses.  Once the patient was adequately sedated and  maintained on low-flow oxygen and continuous cardiac monitoring, the Olympus  videopanendoscope was advanced through the mouthpiece, over the tongue, into  the esophagus under direct vision.  The entire esophagus appeared normal  with no evidence of ring, stricture, mass, esophagitis, or Barrett's mucosa.  The scope was then advanced into the stomach.  Mild antral gastritis was  noticed.  No ulcers, erosions, masses, or polyps were seen.  Small bowel  biopsies were done to rule out sprue. The small bowel appeared normal as  well.   IMPRESSION:  Normal esophagogastroduodenoscopy except for mild antral  gastritis, small bowel biopsies done to rule out sprue.   RECOMMENDATIONS:  1.  Await  pathology results.  2.  Proceed with a colonoscopy at this time.  Further recommendations will      be made thereafter.      JNM/MEDQ  D:  11/02/2004  T:  11/03/2004  Job:  [id number]   cc:   Geoffry Paradise, M.D.  [address]  [place]  [place] [address]  Fax: [phone]

## 2011-01-10 NOTE — H&P (Signed)
NAMEMarland Carey  Carey, [name]                          ACCOUNT NO.:  [id number]   MEDICAL RECORD NO.:  [id number]                   PATIENT TYPE:  INP   LOCATION:  0448                                 FACILITY:  [facility]   PHYSICIAN:  Larina Earthly, M.D.                     DATE OF BIRTH:  1956-06-19   DATE OF ADMISSION:  04/30/2003  DATE OF DISCHARGE:                                HISTORY & PHYSICAL   CHIEF COMPLAINT:  Nausea, vomiting and fever.   HISTORY OF PRESENT ILLNESS:  This is a 55 year old Caucasian female who has  a long history of recurrent nausea, vomiting and fever, status post  cholecystectomy that was performed in 2001.  She has been evaluated by  physicians at [facility], Dr. Loreta Ave, and [facility] and is status post multiple endoscopies, colonoscopies and  ERCPs, small-bowel follow-throughs and CT scans, which were all  unremarkable, per patient report, and limited discharge summaries in the  computer.  Patient states this has been a very frustrating experience, and  it is unclear as to what is causing the symptomatology at this time.  She  states that she has been hospitalized approximately twice a year for the  same symptoms; however, E-Chart documents the last discharge summary as  being September 2002.  She states that during her hospitalizations she has  not received any antibiotics except on one occasion, for which she received  Cipro for a possible urinary tract infection.   Today, the patient developed a temperature of 101.5 degrees Farenheit  associated with nausea and vomiting of projectile yellow-bilious emesis.  She attempted to take oral Phenergan at home, however, was unable to keep  these down secondary to recurrent emesis.  She presented to the emergency  room for subsequent evaluation.  Here in the emergency room, renal  parameters, liver function tests, CBC and urinalysis are all unremarkable.  Potassium was slightly low.   Patient was given one dose of morphine and  Zofran by the emergency room physician, which relieved her symptoms.  However, prior to planned discharge home, she developed, again, yellow-  bilious emesis, and she is now being subsequently admitted for IV fluids,  antiemetics, blood cultures, and possible GI consultation if there is no  resolution in her symptoms.   REVIEW OF SYSTEMS:  Negative for headache, new neurological or  musculoskeletal deficits.  Negative for depression; however, the patient  states that she discontinued her Lexapro approximately two weeks ago.  She  denies any dysuria or urinary frequency.  She denies any chest pain,  shortness of breath, gastroesophageal reflux disease, blood per rectum or  urine.  Patient denies any vaginal-bleeding.  She is status post  hysterectomy.   PROBLEM LIST:  1. History of depression.  2. History of hypertension, treated with angiotensin-converting enzyme     inhibitor and hydrochlorothiazide.  3. History of cholecystectomy  in 2001 by Dr. Darnell Level.  4. History of hysterectomy in 2000.   SOCIAL HISTORY:  The patient is divorced, has no children, works as a IT trainer at [employer].  Has a half-pack-per-day history of  tobacco abuse and has occasional social alcohol intake.   FAMILY HISTORY:  Significant for hypertension.   CURRENT MEDICATIONS:  1. Prinivil 10 mg.  2. Hydrochlorothiazide 25 mg.  3. Patient does have a recent history of using Lexapro each day.   ALLERGIES:  She is allergic to NONSTEROIDAL ANTI-INFLAMMATORY agents,  resulting in hives.   PHYSICAL EXAMINATION:  GENERAL:  We have a thin Caucasian female in no  apparent distress, walking around our emergency room, going to the sink, in  no apparent distress but with emesis basin containing yellow-bilious  material.  VITAL SIGNS:  Temperature 101.5 degrees Farenheit, pulse 101, respirations  22 nonlabored, initial blood pressure 179/114 in the  emergency room, being  re-measured, oxygen saturation 100% on room air.  EYES:  Sclerae anicteric.  HEAD:  Normocephalic, atraumatic.  There is no sinus tenderness.  There is  no oropharyngeal lesions.  NECK:  Supple. There is no cervical lymphadenopathy.  There is no  thyromegaly.  LUNGS:  Clear to auscultation bilaterally.  CARDIOVASCULAR:  Regular rate and rhythm.  ABDOMEN:  Soft nondistended abdomen.  Patient is marginally tender in the  right upper quadrant, status post morphine administration.  There is no  rebound.  Bowel sounds are present.  EXTREMITIES:  No edema.  Pedal pulses are intact.  NEUROLOGIC:  Grossly nonfocal.  There is no active synovitis.   LABORATORY EVALUATION:  White blood cell count 7.4, hemoglobin 13.6,  hematocrit 38.9%, platelet count 329; sodium 141, potassium 3.3, glucose  129, BUN 17, creatinine 0.9, calcium 9.4, albumin 4.1; AST 16, ALT 20,  alkaline phosphatase 82, total bilirubin 0.5; urinalysis unremarkable;  lipase 17.   ASSESSMENT/PLAN:  1. Nausea and vomiting of unclear etiology, but patient is status post     multiple evaluations by gastroenterologists at three different locations     with no etiology ascertained.  We will continue to provide intravenous     fluids, proton pump inhibitors and antiemetics, and monitor for     resolution.  If symptoms do not resolve, we will re-consult     gastroenterology.  Again, we will provide empiric proton pump inhibitors.  2. Hypokalemia, presumably secondary to nausea and vomiting.  We will     replete and recheck in the morning.  3. Fever.  We will obtain blood cultures and sensitivities x 2, and provide     empiric Tylenol.  We will provide antibiotics if localized source can be     ascertained.  4. Hypertension.  Hold diuretics and ACE inhibitors pending resolution of #1     and monitor for elevated blood pressure. 5. Depression.  Appears stable with no suicidal ideation at this time.  We     will  continue to follow.                                               Larina Earthly, M.D.    RA/MEDQ  D:  05/01/2003  T:  05/01/2003  Job:  [id number]   cc:   Wilhemina Bonito. Marina Goodell, M.D. [facility]   Geoffry Paradise, M.D.  [address]  [place]  [place] [address]  Fax: [phone]

## 2011-03-01 ENCOUNTER — Emergency Department (INDEPENDENT_AMBULATORY_CARE_PROVIDER_SITE_OTHER): Payer: Self-pay

## 2011-03-01 ENCOUNTER — Emergency Department (HOSPITAL_BASED_OUTPATIENT_CLINIC_OR_DEPARTMENT_OTHER)
Admission: EM | Admit: 2011-03-01 | Discharge: 2011-03-01 | Disposition: A | Discharge status: Home / Self Care | Payer: Self-pay | Attending: Emergency Medicine | Admitting: Emergency Medicine

## 2011-03-01 ENCOUNTER — Encounter: Payer: Self-pay | Admitting: Emergency Medicine

## 2011-03-01 DIAGNOSIS — R112 Nausea with vomiting, unspecified: Secondary | ICD-10-CM

## 2011-03-01 DIAGNOSIS — I10 Essential (primary) hypertension: Secondary | ICD-10-CM | POA: Insufficient documentation

## 2011-03-01 DIAGNOSIS — D72829 Elevated white blood cell count, unspecified: Secondary | ICD-10-CM

## 2011-03-01 DIAGNOSIS — R197 Diarrhea, unspecified: Secondary | ICD-10-CM

## 2011-03-01 DIAGNOSIS — E860 Dehydration: Secondary | ICD-10-CM

## 2011-03-01 DIAGNOSIS — R1011 Right upper quadrant pain: Secondary | ICD-10-CM

## 2011-03-01 DIAGNOSIS — R109 Unspecified abdominal pain: Secondary | ICD-10-CM

## 2011-03-01 HISTORY — DX: Essential (primary) hypertension: I10

## 2011-03-01 LAB — DIFFERENTIAL
Basophils Absolute: 0 10*3/uL (ref 0.0–0.1)
Eosinophils Relative: 0 % (ref 0–5)
Lymphocytes Relative: 9 % — ABNORMAL LOW (ref 12–46)
Neutro Abs: 17.1 10*3/uL — ABNORMAL HIGH (ref 1.7–7.7)
Neutrophils Relative %: 88 % — ABNORMAL HIGH (ref 43–77)

## 2011-03-01 LAB — COMPREHENSIVE METABOLIC PANEL
Alkaline Phosphatase: 146 U/L — ABNORMAL HIGH (ref 39–117)
BUN: 31 mg/dL — ABNORMAL HIGH (ref 6–23)
Chloride: 101 mEq/L (ref 96–112)
GFR calc Af Amer: >60 mL/min (ref >60)
GFR calc non Af Amer: >60 mL/min (ref >60)
Glucose, Bld: 181 mg/dL — ABNORMAL HIGH (ref 70–99)
Potassium: 3.6 mEq/L (ref 3.5–5.1)
Total Bilirubin: 0.6 mg/dL (ref 0.3–1.2)

## 2011-03-01 LAB — CBC
MCV: 93.9 fL (ref 78.0–100.0)
Platelets: 399 10*3/uL (ref 150–400)
RDW: 13.8 % (ref 11.5–15.5)
WBC: 19.5 10*3/uL — ABNORMAL HIGH (ref 4.0–10.5)

## 2011-03-01 LAB — URINALYSIS, ROUTINE W REFLEX MICROSCOPIC
Ketones, ur: 15 mg/dL — AB
Leukocytes, UA: NEGATIVE
Nitrite: NEGATIVE
pH: 5 (ref 5.0–8.0)

## 2011-03-01 LAB — URINE MICROSCOPIC-ADD ON

## 2011-03-01 MED ORDER — ONDANSETRON HCL 4 MG/2ML IJ SOLN
INTRAMUSCULAR | Status: AC
  Filled 2011-03-01: qty 2

## 2011-03-01 MED ORDER — HYDROMORPHONE HCL 1 MG/ML IJ SOLN
INTRAMUSCULAR | Status: AC
  Filled 2011-03-01: qty 1

## 2011-03-01 MED ORDER — HYDROMORPHONE HCL 4 MG PO TABS: 4.0000 mg | ORAL_TABLET | ORAL | Status: AC | PRN

## 2011-03-01 MED ORDER — SODIUM CHLORIDE 0.9 % IV SOLN
999.0000 mL | Freq: Once | INTRAVENOUS | Status: AC
  Administered 2011-03-01: 1000 mL via INTRAVENOUS

## 2011-03-01 MED ORDER — ONDANSETRON HCL 4 MG/2ML IJ SOLN
4.0000 mg | Freq: Once | INTRAMUSCULAR | Status: AC
  Administered 2011-03-01: 4 mg via INTRAVENOUS

## 2011-03-01 MED ORDER — SODIUM CHLORIDE 0.9 % IV SOLN
Freq: Once | INTRAVENOUS | Status: AC
  Administered 2011-03-01: 08:00:00 via INTRAVENOUS

## 2011-03-01 MED ORDER — HYDROMORPHONE HCL 1 MG/ML IJ SOLN
1.0000 mg | Freq: Once | INTRAMUSCULAR | Status: AC
  Administered 2011-03-01: 1 mg via INTRAVENOUS

## 2011-03-01 MED ORDER — LORAZEPAM 0.5 MG PO TABS: 0.5000 mg | ORAL_TABLET | Freq: Three times a day (TID) | ORAL | Status: AC | PRN

## 2011-03-01 NOTE — ED Notes (Signed)
Pt c/o n/v/d and RUQ abd pain.

## 2011-03-01 NOTE — ED Notes (Signed)
Patient is resting comfortably. 

## 2011-03-01 NOTE — ED Notes (Signed)
Vital signs stable. 

## 2011-03-01 NOTE — ED Provider Notes (Signed)
History     Chief Complaint  Patient presents with  . Abdominal Pain   Patient is a 55 y.o. female presenting with abdominal pain. The history is provided by the patient.  Abdominal Pain The primary symptoms of the illness include abdominal pain, nausea and vomiting. The primary symptoms of the illness do not include fever, shortness of breath, diarrhea, hematemesis, dysuria, vaginal discharge or vaginal bleeding. The current episode started yesterday. The onset of the illness was gradual. The problem has been gradually worsening.  The abdominal pain began yesterday. The pain came on gradually. The abdominal pain has been gradually worsening since its onset. The abdominal pain is located in the RUQ. The abdominal pain does not radiate. The abdominal pain is relieved by nothing. The abdominal pain is exacerbated by vomiting.  Nausea began yesterday.  The vomiting began yesterday. The emesis contains stomach contents.  The patient states that she believes she is currently not pregnant. The patient has not had a change in bowel habit. Risk factors for an acute abdominal problem include a history of abdominal surgery. Additional symptoms associated with the illness include diaphoresis. Symptoms associated with the illness do not include urgency, hematuria, frequency or back pain. Associated medical issues comments: Pt with h/o cholecystectomy 12 years ago and states that ever since then she has had recurring problems with RUQ pain with nausea and vomiting.  .    Past Medical History  Diagnosis Date  . Hypertension     Past Surgical History  Procedure Date  . Abdominal hysterectomy   . Cholecystectomy     No family history on file.  History  Substance Use Topics  . Smoking status: Current Everyday Smoker    Types: Cigarettes  . Smokeless tobacco: Not on file  . Alcohol Use: Yes    OB History    Grav Para Term Preterm Abortions TAB SAB Ect Mult Living                  Review of  Systems  Constitutional: Positive for diaphoresis and appetite change. Negative for fever.  HENT: Negative.   Eyes: Negative.  Negative for discharge and redness.  Respiratory: Negative.  Negative for cough and shortness of breath.   Cardiovascular: Negative.  Negative for chest pain.  Gastrointestinal: Positive for nausea, vomiting and abdominal pain. Negative for diarrhea and hematemesis.  Genitourinary: Negative.  Negative for dysuria, urgency, frequency, hematuria, vaginal bleeding and vaginal discharge.  Musculoskeletal: Negative.  Negative for back pain.  Skin: Negative.  Negative for color change and rash.  Neurological: Negative.  Negative for syncope and headaches.  Hematological: Negative.  Negative for adenopathy.  Psychiatric/Behavioral: Negative.  Negative for confusion.  All other systems reviewed and are negative.    Physical Exam  BP 152/94  Pulse 136  Temp(Src) 99.3 F (37.4 C) (Oral)  Resp 20  SpO2 99%  Physical Exam  Constitutional: She is oriented to person, place, and time. She appears well-developed and well-nourished.  Non-toxic appearance. She does not have a sickly appearance.  HENT:  Head: Normocephalic and atraumatic.  Eyes: Conjunctivae, EOM and lids are normal. Pupils are equal, round, and reactive to light. No scleral icterus.  Neck: Trachea normal and normal range of motion. Neck supple.  Cardiovascular: Regular rhythm and normal heart sounds.  Tachycardia present.   Pulmonary/Chest: Effort normal and breath sounds normal.  Abdominal: Soft. Normal appearance. There is no tenderness. There is no rebound, no guarding and no CVA tenderness.  Musculoskeletal: Normal  range of motion.  Neurological: She is alert and oriented to person, place, and time. She has normal strength.  Skin: Skin is warm and intact. No rash noted. She is diaphoretic.    ED Course  Procedures  MDM Pt with h/o chronic problems with these symptoms. Will send LFTs, lipase to  look for any changes. On review of records pt was last in a [facility] in August 2011 for similar symptoms.  Pt's abd is benign at this time.  Will treat her pain/nausea and begin IVF for rehydration given her tachycardia.  Will reassess.    Pt with increased WBC count today.  LFTs ok.  UA neg.  AAS does not show obstruction.  Pt's abd re-examined and quite benign at this time.  No tenderness on exam.  WBC count could be due to increased vomiting overnight.  Will continue pain/nausea meds and IVF and reassess.        Nat Christen, MD 03/01/11 0700

## 2011-03-01 NOTE — Discharge Instructions (Signed)
 B.R.A.T. Diet Your doctor has recommended the B.R.A.T. diet for you or your child until the condition improves. This is often used to help control diarrhea and vomiting symptoms. If you or your child can tolerate clear liquids, you may have:  Bananas.   Rice.   Applesauce.   Toast (and other simple starches such as crackers, potatoes, noodles).  Be sure to avoid dairy products, meats, and fatty foods until symptoms are better. Fruit juices such as apple, grape, and prune juice can make diarrhea worse. Avoid these. Continue this diet for 2 days or as instructed by your caregiver. Document Released: 08/11/2005 Document Re-Released: 06/08/2007 [facility] Patient Information 2011 [place], [place].Dehydration Dehydration is the reduction of water and fluid from the body to a level below that required for proper functioning. CAUSES Dehydration occurs when there is excessive fluid loss from the body or when loss of normal fluids is not adequately replaced.  Loss of fluids occurs in vomiting, diarrhea, excessive sweating, excessive urine output, or excessive loss of fluid from the lungs (as occurs in fever or in patients on a ventilator).   Inadequate fluid replacement occurs with nausea or decreased appetite due to illness, sore throat, or mouth pain.  SYMPTOMS Mild dehydration  Thirst (infants and young children may not be able to tell you they are thirsty).   Dry lips.   Slightly dry mouth membranes.  Moderate dehydration  Very dry mouth membranes.   Sunken eyes.   Sunken soft spot (fontanelle) on infant's head.   Skin does not bounce back quickly when lightly pinched and released.   Decreased urine production.   Decreased tear production.  Severe dehydration  Rapid, weak pulse (more than 100 beats per minute at rest).   Cold hands and feet.   Loss of ability to sweat in spite of heat and temperature.   Rapid breathing.   Blue lips.   Confusion, lethargy, difficult to  arouse.   Minimal urine production.   No tears.  DIAGNOSIS Your caregiver will diagnose dehydration based on your symptoms and your exam. Blood and urine tests will help confirm the diagnosis. The diagnostic evaluation should also identify the cause of dehydration. PREVENTION The body depends on a proper balance of fluid and salts (electrolytes) for normal function. Adequate fluid intake in the presence of illness or other stresses (such as extreme exercise) is important.  TREATMENT  Mild dehydration is safe to self-treat for most ages as long as it does not worsen. Contact your caregiver for even mild dehydration in infants and the elderly.   In teenagers and adults with moderate dehydration, careful home treatment (as outlined below) can be safe. Phone contact with a caregiver is advised. Children under 75 years of age with moderate dehydration should see a caregiver.   If you or your child is severely dehydrated, go to a hospital for treatment. Intravenous (IV) fluids will quickly reverse dehydration and are often lifesaving in young children, infants, and elderly persons.  HOME CARE INSTRUCTIONS Small amounts of fluids should be taken frequently. Large amounts at one time may not be tolerated. Plain water may be harmful in infants and the elderly. Oral rehydration solutions (ORS) are available at pharmacies and grocery stores. ORS replaces water and important electrolytes in proper proportions. Sports drinks are not as effective as ORS and may be harmful because the sugar can make diarrhea worse.  As a general guideline for children, replace any new fluid losses from diarrhea and/or vomiting with ORS as follows:  If your child weighs 22 pounds or under (10 kg or less), give 60-120 mL (1/4-1/2 cup or 2-4 ounces) of ORS for each diarrheal stool or vomiting episode.   If your child weighs more than 22 pounds (more than 10 kg), give 120-240 mL (1/2-1 cup or 4-8 ounces) of ORS for each  diarrheal stool or vomiting episode.  If your child is vomiting, it may be helpful to give the above ORS replacement in 5 mL (1 teaspoon) amounts every 5 minutes and increase as tolerated.   While correcting for dehydration, children should eat normally. However, foods high in sugar should be avoided because they may worsen diarrhea. Large amounts of carbonated soft drinks, juice, gelatin desserts, and other highly sugared drinks should be avoided.   After correction of dehydration, other liquids that are appealing to the child may be added. Children should drink small amounts of fluids frequently and fluids should be increased as tolerated. Children should drink enough fluids to keep urine clear or pale yellow.  Adults should eat normally while drinking more fluids than usual. Drink small amounts of fluids frequently and increase the amount as tolerated. Drink enough fluids to keep urine clear or pale yellow. Broths, weak decaffeinated tea, lemon-lime soft drinks (allowed to go flat), and ORS replace fluids and electrolytes.  Avoid:  Carbonated drinks.  Juice.   Extremely hot or cold fluids.   Caffeine drinks.   Fatty, greasy foods.   Alcohol .  Tobacco.   Too much intake of anything at one time.   Gelatin desserts.    Probiotics are active cultures of beneficial bacteria. They may lessen the amount and number of diarrheal stools in adults. Probiotics can be found in yogurt with active cultures and in supplements.   Wash your hands well to avoid spreading germs (bacteria) and viruses.   Antidiarrheal medicines are not recommended for infants and children.   Only take over-the-counter or prescription medicines for pain, discomfort, or fever as directed by your caregiver. Do not give aspirin to children.   For adults with dehydration, ask your caregiver if you should continue all prescribed and over-the-counter medicines.   If your caregiver has given you a follow-up appointment,  it is very important to keep that appointment. Not keeping the appointment could result in a lasting (chronic) or permanent injury and disability. If there is any problem keeping the appointment, you must call to reschedule.  SEEK IMMEDIATE MEDICAL CARE IF:  You are unable to keep fluids down or other symptoms become worse despite treatment.   Vomiting or diarrhea develops and becomes persistent.   There is vomiting of blood or green matter (bile).   There is blood in the stool or the stools are black and tarry.   There is no urine output in 6 to 8 hours or there is only a small amount of very dark urine.   Abdominal pain develops, increases, or localizes.   You or your child has an oral temperature above 101, not controlled by medicine.   Your baby is older than 3 months with a rectal temperature of 102.2F (38.9 C) or higher.   Your baby is 21 months old or younger with a rectal temperature of 100.4 F (38 C) or higher.   You develop excessive weakness, dizziness, fainting, or extreme thirst.   You develop a rash, stiff neck, severe headache, or you become irritable, sleepy, or difficult to awaken.  MAKE SURE YOU:  Understand these instructions.   Will watch your  condition.   Will get help right away if you are not doing well or get worse.  Document Released: 08/11/2005 Document Re-Released: 11/05/2009 [facility] Patient Information 2011 [place], [place].Nausea and Vomiting Nausea is a sick feeling that often comes before throwing up (vomiting). Vomiting is a reflex where stomach contents come out of your mouth. Vomiting can cause severe loss of body fluids (dehydration). Children and elderly adults dehydrate quickly, especially if they also have diarrhea. CAUSES  Upsetting smells, sounds, or sights.  Motion sickness.   Pregnancy.   Breathing in smoke or bad fumes.   Head injuries.   Infections.  Food poisoning.   Alcohol .   Belly (abdominal) problems.   Migraine  headaches.   HOME CARE INSTRUCTIONS  Take all medicine as directed by your caregiver.   If you do not have an appetite, do not force yourself to eat.   If you have an appetite, eat a normal diet unless your caregiver tells you differently.   Eat a variety of complex carbohydrates (rice, wheat, potatoes, bread), lean meats, yogurt, fruits, and vegetables.   Avoid high fat foods because they are more difficult to digest.   Fluids are less likely to cause nausea. They can also prevent dehydration.   Watch for signs of dehydration:   Severe thirst.  Dry lips and mouth.   Dizziness.   Dark urine.   Decreasing urine frequency and amount.  Confusion.   Rapid breathing or pulse.    If any of the above signs of dehydration are present, an oral rehydration solution (ORS) should be started as directed by your caregiver. A more rapid treatment may be necessary for the elderly.  SEEK IMMEDIATE MEDICAL CARE IF:  You have blood or brown flecks (like coffee grounds) in your vomit.   You have a severe headache or stiff neck.   You are confused.   You have severe abdominal pain.   You do not urinate every 8 hours.   You or your child has an oral temperature above 101, not controlled by medicine.   Your baby is older than 3 months with a rectal temperature of 102 F (38.9 C) or higher.   Your baby is 1 months old or younger with a rectal temperature of 100.4 F (38 C) or higher.  MAKE SURE YOU:  Understand these instructions.   Will watch your condition.   Will get help right away if you are not doing well or get worse.  Document Released: 08/11/2005 Document Re-Released: 11/05/2009 [facility] Patient Information 2011 [place], [place].Abdominal Pain Abdominal pain can be caused by many things. Your caregiver decides the seriousness of your pain by an examination and possibly blood tests and X-rays. Many cases can be observed and treated at home. Most abdominal pain is not caused  by a disease and will probably improve without treatment. However, in many cases, more time must pass before a clear cause of the pain can be found. Before that point, it may not be known if you need more testing, or if hospitalization or surgery is needed. HOME CARE INSTRUCTIONS  Do not take laxatives unless directed by your caregiver.   Take pain medicine only as directed by your caregiver.   Only take over-the-counter or prescription medicines for pain, discomfort, or fever as directed by your caregiver.   Try a clear liquid diet (broth, tea, or water) for 1-2 days or as ordered by your caregiver. Slowly move to a bland diet as tolerated.  SEEK IMMEDIATE MEDICAL CARE  IF:  The pain does not go away.   You or your child has an oral temperature above 101, not controlled by medicine.   You keep throwing up (vomiting).   The pain is felt only in portions of the abdomen. Pain in the right side could possibly be appendicitis. In an adult, pain in the left lower portion of the abdomen could be colitis or diverticulitis.   You pass bloody or black tarry stools.  MAKE SURE YOU:  Understand these instructions.   Will watch your condition.   Will get help right away if you are not doing well or get worse.  Document Released: 05/21/2005 Document Re-Released: 11/05/2009 [facility] Patient Information 2011 [place], [place].

## 2011-03-01 NOTE — ED Provider Notes (Signed)
  Physical Exam  BP 114/75  Pulse 107  Temp(Src) 99.3 F (37.4 C) (Oral)  Resp 20  SpO2 100%  Physical Exam  ED Course  Procedures  MDM   MDM Received patient in sign out.  Pt's symptoms improving.  sats are ok.  HR is improving.  Will continue IVF's and pain meds and will reassess.  Pt has chronic h/o same.  This time WBC is up, but her exam is benign.  If pain continues, will consider CT scan, possible lactic acid.      At this point, pt now reports minimal pain, feels improved, feels that she can go home.   She has percocet at home.  She asks for a few dilaudid tablets, will make appt with PCP.  Also she reports ativan PO helps with her nausea.  Will give her minimal prescriptions for these meds.      Gavin Pound. Oletta Lamas, MD 03/01/11 [phone]

## 2011-03-12 ENCOUNTER — Emergency Department (HOSPITAL_BASED_OUTPATIENT_CLINIC_OR_DEPARTMENT_OTHER)
Admission: EM | Admit: 2011-03-12 | Discharge: 2011-03-12 | Disposition: A | Discharge status: Home / Self Care | Payer: Self-pay | Attending: Emergency Medicine | Admitting: Emergency Medicine

## 2011-03-12 ENCOUNTER — Encounter (HOSPITAL_BASED_OUTPATIENT_CLINIC_OR_DEPARTMENT_OTHER): Payer: Self-pay | Admitting: *Deleted

## 2011-03-12 DIAGNOSIS — IMO0002 Reserved for concepts with insufficient information to code with codable children: Secondary | ICD-10-CM | POA: Insufficient documentation

## 2011-03-12 DIAGNOSIS — R197 Diarrhea, unspecified: Secondary | ICD-10-CM | POA: Insufficient documentation

## 2011-03-12 DIAGNOSIS — R112 Nausea with vomiting, unspecified: Secondary | ICD-10-CM | POA: Insufficient documentation

## 2011-03-12 DIAGNOSIS — F17200 Nicotine dependence, unspecified, uncomplicated: Secondary | ICD-10-CM | POA: Insufficient documentation

## 2011-03-12 DIAGNOSIS — R1011 Right upper quadrant pain: Secondary | ICD-10-CM | POA: Insufficient documentation

## 2011-03-12 DIAGNOSIS — R000 Tachycardia, unspecified: Secondary | ICD-10-CM | POA: Insufficient documentation

## 2011-03-12 DIAGNOSIS — I10 Essential (primary) hypertension: Secondary | ICD-10-CM | POA: Insufficient documentation

## 2011-03-12 LAB — COMPREHENSIVE METABOLIC PANEL
AST: 14 U/L (ref 0–37)
Albumin: 4.2 g/dL (ref 3.5–5.2)
Alkaline Phosphatase: 145 U/L — ABNORMAL HIGH (ref 39–117)
BUN: 14 mg/dL (ref 6–23)
Chloride: 104 mEq/L (ref 96–112)
Potassium: 3.3 mEq/L — ABNORMAL LOW (ref 3.5–5.1)
Total Bilirubin: 0.2 mg/dL — ABNORMAL LOW (ref 0.3–1.2)

## 2011-03-12 LAB — URINALYSIS, ROUTINE W REFLEX MICROSCOPIC
Bilirubin Urine: NEGATIVE
Glucose, UA: NEGATIVE mg/dL
Hgb urine dipstick: NEGATIVE
Ketones, ur: NEGATIVE mg/dL
Protein, ur: NEGATIVE mg/dL

## 2011-03-12 LAB — CBC
HCT: 42.8 % (ref 36.0–46.0)
MCHC: 33.6 g/dL (ref 30.0–36.0)
MCV: 93.7 fL (ref 78.0–100.0)
RDW: 13.7 % (ref 11.5–15.5)

## 2011-03-12 LAB — DIFFERENTIAL
Basophils Absolute: 0.1 10*3/uL (ref 0.0–0.1)
Basophils Relative: 0 % (ref 0–1)
Eosinophils Relative: 0 % (ref 0–5)
Monocytes Absolute: 0.6 10*3/uL (ref 0.1–1.0)

## 2011-03-12 MED ORDER — ONDANSETRON HCL 4 MG PO TABS: 8.0000 mg | ORAL_TABLET | Freq: Three times a day (TID) | ORAL | Status: AC | PRN

## 2011-03-12 MED ORDER — ONDANSETRON 4 MG PO TBDP
ORAL_TABLET | ORAL | Status: AC
  Administered 2011-03-12: 4 mg via ORAL
  Filled 2011-03-12: qty 1

## 2011-03-12 MED ORDER — SODIUM CHLORIDE 0.9 % IV SOLN
Freq: Once | INTRAVENOUS | Status: AC
  Administered 2011-03-12: 02:00:00 via INTRAVENOUS

## 2011-03-12 MED ORDER — HYDROMORPHONE HCL 1 MG/ML IJ SOLN
1.0000 mg | Freq: Once | INTRAMUSCULAR | Status: AC
  Administered 2011-03-12: 1 mg via INTRAVENOUS
  Filled 2011-03-12: qty 1

## 2011-03-12 MED ORDER — ONDANSETRON HCL 4 MG/2ML IJ SOLN
INTRAMUSCULAR | Status: AC
  Administered 2011-03-12: 4 mg via INTRAVENOUS
  Filled 2011-03-12: qty 2

## 2011-03-12 MED ORDER — ONDANSETRON 4 MG PO TBDP
4.0000 mg | ORAL_TABLET | Freq: Once | ORAL | Status: AC
  Administered 2011-03-12: 4 mg via ORAL

## 2011-03-12 MED ORDER — HYDROMORPHONE HCL 1 MG/ML IJ SOLN
1.0000 mg | Freq: Once | INTRAMUSCULAR | Status: AC
  Administered 2011-03-12: 1 mg via INTRAVENOUS

## 2011-03-12 MED ORDER — METOCLOPRAMIDE HCL 5 MG/ML IJ SOLN
10.0000 mg | Freq: Once | INTRAMUSCULAR | Status: AC
  Administered 2011-03-12: 10 mg via INTRAVENOUS
  Filled 2011-03-12: qty 2

## 2011-03-12 MED ORDER — ONDANSETRON HCL 4 MG/2ML IJ SOLN
4.0000 mg | Freq: Once | INTRAMUSCULAR | Status: AC
  Administered 2011-03-12: 4 mg via INTRAVENOUS

## 2011-03-12 MED ORDER — HYDROMORPHONE HCL 1 MG/ML IJ SOLN
INTRAMUSCULAR | Status: AC
  Administered 2011-03-12: 1 mg via INTRAVENOUS
  Filled 2011-03-12: qty 1

## 2011-03-12 NOTE — ED Notes (Signed)
While waiting for her ride, pt vomited again. Zofran ODT given.

## 2011-03-12 NOTE — ED Notes (Signed)
Pt sts she is feeling much better and wishes to be d/c'ed home. Pt has called for a ride and knows she will be d/c'ed once her ride is here.

## 2011-03-12 NOTE — ED Provider Notes (Signed)
History     Chief Complaint  Patient presents with  . Emesis   HPI She has a history of episodic right upper quadrant pain nausea and vomiting status post cholecystectomy about 12 years ago. Initially following her cholecystectomy these episodes were approximately monthly; they now occur approximately twice a year. It is noted she did have a similar episode a week ago and was seen for this. The symptoms this visit are again right upper quadrant pain nausea and vomiting consistent with prior episodes. This episode began yesterday morning about 11. She has tried to tolerate the symptoms without seeking medical advice but the symptoms became severe this morning and she presented for treatment. She denies diarrhea. She denies fever but has had chills. Her pain was not relieved with home medications partly because she was unable to keep them on her stomach. Past Medical History  Diagnosis Date  . Hypertension     Past Surgical History  Procedure Date  . Abdominal hysterectomy   . Cholecystectomy     No family history on file.  History  Substance Use Topics  . Smoking status: Current Everyday Smoker    Types: Cigarettes  . Smokeless tobacco: Not on file  . Alcohol Use: Yes      Review of Systems  All other systems reviewed and are negative.    Physical Exam  BP 158/95  Pulse 119  Temp(Src) 99.3 F (37.4 C) (Oral)  Resp 20  SpO2 100%  Physical Exam General: Well-developed, well-nourished female in no acute distress; appearance consistent with age of record HEENT: normocephalic, atraumatic Eyes: pupils equal round and reactive to light; extraocular muscles intact Neck: supple Heart: regular rate and rhythm; no murmurs, rubs or gallops; tachycardic Lungs: clear to auscultation bilaterally Abdomen: soft; nontender; nondistended; no masses or hepatosplenomegaly; bowel sounds present Extremities: No deformity; full range of motion; pulses normal Neurologic: Awake, alert and  oriented;motor function intact in all extremities and symmetric;sensation grossly intact; no facial droop Psychiatric: Mildly agitated due to pain with congruent affect   ED Course  Procedures  MDM Nursing notes and vitals signs reviewed.  Summary of this visit's results, reviewed by myself:  Labs:  Results for orders placed during the hospital encounter of 03/12/11  COMPREHENSIVE METABOLIC PANEL      Component Value Range   Sodium 146 (*) 135 - 145 (mEq/L)   Potassium 3.3 (*) 3.5 - 5.1 (mEq/L)   Chloride 104  96 - 112 (mEq/L)   CO2 25  19 - 32 (mEq/L)   Glucose, Bld 143 (*) 70 - 99 (mg/dL)   BUN 14  6 - 23 (mg/dL)   Creatinine, Ser 1.61  0.50 - 1.10 (mg/dL)   Calcium 9.9  8.4 - 09.6 (mg/dL)   Total Protein 7.7  6.0 - 8.3 (g/dL)   Albumin 4.2  3.5 - 5.2 (g/dL)   AST 14  0 - 37 (U/L)   ALT 20  0 - 35 (U/L)   Alkaline Phosphatase 145 (*) 39 - 117 (U/L)   Total Bilirubin 0.2 (*) 0.3 - 1.2 (mg/dL)   GFR calc non Af Amer >60  >60 (mL/min)   GFR calc Af Amer >60  >60 (mL/min)  URINALYSIS, ROUTINE W REFLEX MICROSCOPIC      Component Value Range   Color, Urine YELLOW  YELLOW    Appearance CLOUDY (*) CLEAR    Specific Gravity, Urine 1.019  1.005 - 1.030    pH 8.0  5.0 - 8.0    Glucose,  UA NEGATIVE  NEGATIVE (mg/dL)   Hgb urine dipstick NEGATIVE  NEGATIVE    Bilirubin Urine NEGATIVE  NEGATIVE    Ketones, ur NEGATIVE  NEGATIVE (mg/dL)   Protein, ur NEGATIVE  NEGATIVE (mg/dL)   Urobilinogen, UA 1.0  0.0 - 1.0 (mg/dL)   Nitrite NEGATIVE  NEGATIVE    Leukocytes, UA NEGATIVE  NEGATIVE   CBC      Component Value Range   WBC 14.1 (*) 4.0 - 10.5 (K/uL)   RBC 4.57  3.87 - 5.11 (MIL/uL)   Hemoglobin 14.4  12.0 - 15.0 (g/dL)   HCT 04.5  40.9 - 81.1 (%)   MCV 93.7  78.0 - 100.0 (fL)   MCH 31.5  26.0 - 34.0 (pg)   MCHC 33.6  30.0 - 36.0 (g/dL)   RDW 91.4  78.2 - 95.6 (%)   Platelets 425 (*) 150 - 400 (K/uL)  DIFFERENTIAL      Component Value Range   Neutrophils Relative 82 (*) 43  - 77 (%)   Neutro Abs 11.6 (*) 1.7 - 7.7 (K/uL)   Lymphocytes Relative 14  12 - 46 (%)   Lymphs Abs 1.9  0.7 - 4.0 (K/uL)   Monocytes Relative 4  3 - 12 (%)   Monocytes Absolute 0.6  0.1 - 1.0 (K/uL)   Eosinophils Relative 0  0 - 5 (%)   Eosinophils Absolute 0.0  0.0 - 0.7 (K/uL)   Basophils Relative 0  0 - 1 (%)   Basophils Absolute 0.1  0.0 - 0.1 (K/uL)    Imaging Studies: Dg Abd Acute W/chest  03/01/2011  *RADIOLOGY REPORT*  Clinical Data: Abdominal pain, nausea, vomiting and diarrhea.  ACUTE ABDOMEN SERIES (ABDOMEN 2 VIEW & CHEST 1 VIEW)  Comparison: 03/07/2010  Findings: Chest shows clear lungs.  No edema or infiltrates.  The abdominal films show no evidence of acute bowel obstruction or free air.  Clips are again noted from prior cholecystectomy.  No abnormal calcifications.  IMPRESSION: No acute findings.  Original Report Authenticated By: Reola Calkins, M.D.    3:57 AM Feels better, no further emesis; requests Rx for Zofran.        Hanley Seamen, MD 03/12/11 [phone]

## 2011-03-12 NOTE — ED Notes (Signed)
Pt c/o RUQ pain with N/V that began yesterday at 1100 a.m. Pt sts she has frequent episodes of same since having her gallbladder out 12 years ago.

## 2011-03-14 ENCOUNTER — Encounter (HOSPITAL_BASED_OUTPATIENT_CLINIC_OR_DEPARTMENT_OTHER): Payer: Self-pay | Admitting: *Deleted

## 2011-03-14 ENCOUNTER — Emergency Department (INDEPENDENT_AMBULATORY_CARE_PROVIDER_SITE_OTHER): Payer: Self-pay

## 2011-03-14 ENCOUNTER — Emergency Department (HOSPITAL_BASED_OUTPATIENT_CLINIC_OR_DEPARTMENT_OTHER)
Admission: EM | Admit: 2011-03-14 | Discharge: 2011-03-14 | Disposition: A | Discharge status: Home / Self Care | Payer: Self-pay | Attending: Emergency Medicine | Admitting: Emergency Medicine

## 2011-03-14 DIAGNOSIS — F17200 Nicotine dependence, unspecified, uncomplicated: Secondary | ICD-10-CM | POA: Insufficient documentation

## 2011-03-14 DIAGNOSIS — R112 Nausea with vomiting, unspecified: Secondary | ICD-10-CM | POA: Insufficient documentation

## 2011-03-14 DIAGNOSIS — R1084 Generalized abdominal pain: Secondary | ICD-10-CM | POA: Insufficient documentation

## 2011-03-14 DIAGNOSIS — I10 Essential (primary) hypertension: Secondary | ICD-10-CM | POA: Insufficient documentation

## 2011-03-14 DIAGNOSIS — R1110 Vomiting, unspecified: Secondary | ICD-10-CM

## 2011-03-14 DIAGNOSIS — Z79899 Other long term (current) drug therapy: Secondary | ICD-10-CM | POA: Insufficient documentation

## 2011-03-14 DIAGNOSIS — R1011 Right upper quadrant pain: Secondary | ICD-10-CM

## 2011-03-14 LAB — URINALYSIS, ROUTINE W REFLEX MICROSCOPIC
Nitrite: NEGATIVE
Protein, ur: NEGATIVE mg/dL
Specific Gravity, Urine: 1.026 (ref 1.005–1.030)
Urobilinogen, UA: 1 mg/dL (ref 0.0–1.0)

## 2011-03-14 LAB — DIFFERENTIAL
Basophils Absolute: 0 10*3/uL (ref 0.0–0.1)
Basophils Relative: 0 % (ref 0–1)
Eosinophils Absolute: 0 10*3/uL (ref 0.0–0.7)
Eosinophils Relative: 0 % (ref 0–5)
Neutrophils Relative %: 84 % — ABNORMAL HIGH (ref 43–77)

## 2011-03-14 LAB — URINE MICROSCOPIC-ADD ON

## 2011-03-14 LAB — COMPREHENSIVE METABOLIC PANEL
ALT: 22 U/L (ref 0–35)
AST: 25 U/L (ref 0–37)
Albumin: 4.1 g/dL (ref 3.5–5.2)
Calcium: 9.5 mg/dL (ref 8.4–10.5)
Glucose, Bld: 108 mg/dL — ABNORMAL HIGH (ref 70–99)
Potassium: 3.5 mEq/L (ref 3.5–5.1)
Sodium: 138 mEq/L (ref 135–145)
Total Protein: 7.4 g/dL (ref 6.0–8.3)

## 2011-03-14 LAB — CBC
MCH: 31.7 pg (ref 26.0–34.0)
MCHC: 34.1 g/dL (ref 30.0–36.0)
MCV: 93.1 fL (ref 78.0–100.0)
Platelets: 334 10*3/uL (ref 150–400)
RDW: 13 % (ref 11.5–15.5)

## 2011-03-14 MED ORDER — ONDANSETRON HCL 4 MG/2ML IJ SOLN
4.0000 mg | Freq: Once | INTRAMUSCULAR | Status: AC
  Administered 2011-03-14: 4 mg via INTRAVENOUS
  Filled 2011-03-14: qty 2

## 2011-03-14 MED ORDER — IOHEXOL 300 MG/ML  SOLN
100.0000 mL | Freq: Once | INTRAMUSCULAR | Status: AC | PRN
  Administered 2011-03-14: 100 mL via INTRAVENOUS

## 2011-03-14 MED ORDER — HYDROMORPHONE HCL 1 MG/ML IJ SOLN
1.0000 mg | Freq: Once | INTRAMUSCULAR | Status: AC
  Administered 2011-03-14: 1 mg via INTRAVENOUS
  Filled 2011-03-14: qty 1

## 2011-03-14 MED ORDER — SODIUM CHLORIDE 0.9 % IV BOLUS (SEPSIS)
1000.0000 mL | Freq: Once | INTRAVENOUS | Status: AC
  Administered 2011-03-14: 1000 mL via INTRAVENOUS

## 2011-03-14 MED ORDER — SODIUM CHLORIDE 0.9 % IV BOLUS (SEPSIS)
1000.0000 mL | Freq: Once | INTRAVENOUS | Status: AC
  Administered 2011-03-14 (×2): 1000 mL via INTRAVENOUS

## 2011-03-14 MED ORDER — ONDANSETRON HCL 4 MG PO TABS: 8.0000 mg | ORAL_TABLET | Freq: Four times a day (QID) | ORAL | Status: AC

## 2011-03-14 NOTE — ED Provider Notes (Signed)
History    History of Present Illness     No chief complaint on file.  HPI History of Present Illness   Patient Identification Theresa Carey is a 55 y.o. female.  Patient information was obtained from patient. History/Exam limitations: none. Patient presented to the Emergency Department by private vehicle.  Chief Complaint  No chief complaint on file.   Patient presents for evaluation of abdominal pain. Onset of symptoms was gradual starting 5 days ago with gradually worsening course since that time. The pain is located RUQ without radiation. The pain is rated as moderate. The pain is made worse by vomiting and palpation and is relieved by nothing. The patient also complains of anorexia and vomiting. The patient denies diarrhea, fever and frequency. The past workup has included multiple episodes of similar symptoms.  Pt states she has had workup at [facility] including EGD, ERCP multiple other studies.  States symptoms began approx 12 years ago after gallbladder removed. Pt states she is vomiting more than 20 times per day.  This is her 3rd visit to ED in approx 1 week. The pertinent past history includes cholecystectomy. The patient denies Crohn's disease, irritable bowel.   Past Medical History  Diagnosis Date  . Hypertension    No family history on file.[phone]) Current Facility-Administered Medications  Medication Dose Route Frequency Provider Last Rate Last Dose  . HYDROmorphone (DILAUDID) injection 1 mg  1 mg Intravenous Once Ethelda Chick, MD      . ondansetron [facility]) injection 4 mg  4 mg Intravenous Once Ethelda Chick, MD      . sodium chloride 0.9 % bolus 1,000 mL  1,000 mL Intravenous Once Ethelda Chick, MD       Current Outpatient Prescriptions  Medication Sig Dispense Refill  . HYDROmorphone HCl (DILAUDID PO) Take by mouth.        . Zolpidem Tartrate (AMBIEN PO) Take by mouth.        Marland Kitchen amLODipine (NORVASC) 10 MG tablet Take 10 mg by mouth daily.        Marland Kitchen  lisinopril (PRINIVIL,ZESTRIL) 20 MG tablet Take 20 mg by mouth daily.        . ondansetron (ZOFRAN) 4 MG tablet Take 2 tablets (8 mg total) by mouth every 8 (eight) hours as needed for nausea.  20 tablet  0  . oxyCODONE-acetaminophen (PERCOCET) 5-325 MG per tablet Take 1 tablet by mouth every 4 (four) hours as needed.        . potassium chloride SA (K-DUR,KLOR-CON) 20 MEQ tablet Take 20 mEq by mouth 2 (two) times daily.         Allergies  Allergen Reactions  . Avelox (Moxifloxacin Hcl In Nacl)   . Ibuprofen   . Morphine And Related   . Phenergan    History   Social History  . Marital Status: Divorced    Spouse Name: N/A    Number of Children: N/A  . Years of Education: N/A   Occupational History  . Not on file.   Social History Main Topics  . Smoking status: Current Everyday Smoker    Types: Cigarettes  . Smokeless tobacco: Not on file  . Alcohol Use: Yes  . Drug Use: No  . Sexually Active:    Other Topics Concern  . Not on file   Social History Narrative  . No narrative on file   Review of Systems Pertinent items are noted in HPI.  10 Systems reviewed and are negative for  acute change except as noted in the HPI. Physical Exam   BP 142/88  Pulse 94  Temp(Src) 98.8 F (37.1 C) (Oral)  Resp 20  SpO2 98% BP 142/88  Pulse 94  Temp(Src) 98.8 F (37.1 C) (Oral)  Resp 20  SpO2 98% General appearance: alert, cooperative and mild distress Head: Normocephalic, without obvious abnormality, atraumatic Eyes: PERRL, no scleral icterus, no conjunctival injection Throat: lips, mucosa, and tongue normal; teeth and gums normal, except mucous membranes appear dry Lungs: clear to auscultation bilaterally Heart: regular rate and rhythm, S1, S2 normal, no murmur, click, rub or gallop Abdomen: soft, nondistended, ttp in RUQ, no gaurding no rebound Extremities: extremities normal, atraumatic, no cyanosis or edema Pulses: 2+ and symmetric Skin: Skin color, texture, turgor  normal. No rashes or lesions Psych: calm cooperative Neuro: strength and sensation grossly intact, speech normal  ED Course   Studies:  Results for orders placed during the hospital encounter of 03/14/11  URINALYSIS, ROUTINE W REFLEX MICROSCOPIC      Component Value Range   Color, Urine AMBER (*) YELLOW    Appearance CLOUDY (*) CLEAR    Specific Gravity, Urine 1.026  1.005 - 1.030    pH 6.0  5.0 - 8.0    Glucose, UA NEGATIVE  NEGATIVE (mg/dL)   Hgb urine dipstick SMALL (*) NEGATIVE    Bilirubin Urine SMALL (*) NEGATIVE    Ketones, ur 40 (*) NEGATIVE (mg/dL)   Protein, ur NEGATIVE  NEGATIVE (mg/dL)   Urobilinogen, UA 1.0  0.0 - 1.0 (mg/dL)   Nitrite NEGATIVE  NEGATIVE    Leukocytes, UA TRACE (*) NEGATIVE   CBC      Component Value Range   WBC 11.8 (*) 4.0 - 10.5 (K/uL)   RBC 4.35  3.87 - 5.11 (MIL/uL)   Hemoglobin 13.8  12.0 - 15.0 (g/dL)   HCT 40.9  81.1 - 91.4 (%)   MCV 93.1  78.0 - 100.0 (fL)   MCH 31.7  26.0 - 34.0 (pg)   MCHC 34.1  30.0 - 36.0 (g/dL)   RDW 78.2  95.6 - 21.3 (%)   Platelets 334  150 - 400 (K/uL)  DIFFERENTIAL      Component Value Range   Neutrophils Relative 84 (*) 43 - 77 (%)   Neutro Abs 9.9 (*) 1.7 - 7.7 (K/uL)   Lymphocytes Relative 10 (*) 12 - 46 (%)   Lymphs Abs 1.1  0.7 - 4.0 (K/uL)   Monocytes Relative 6  3 - 12 (%)   Monocytes Absolute 0.7  0.1 - 1.0 (K/uL)   Eosinophils Relative 0  0 - 5 (%)   Eosinophils Absolute 0.0  0.0 - 0.7 (K/uL)   Basophils Relative 0  0 - 1 (%)   Basophils Absolute 0.0  0.0 - 0.1 (K/uL)  COMPREHENSIVE METABOLIC PANEL      Component Value Range   Sodium 138  135 - 145 (mEq/L)   Potassium 3.5  3.5 - 5.1 (mEq/L)   Chloride 98  96 - 112 (mEq/L)   CO2 24  19 - 32 (mEq/L)   Glucose, Bld 108 (*) 70 - 99 (mg/dL)   BUN 13  6 - 23 (mg/dL)   Creatinine, Ser <0.86 (*) 0.50 - 1.10 (mg/dL)   Calcium 9.5  8.4 - 57.8 (mg/dL)   Total Protein 7.4  6.0 - 8.3 (g/dL)   Albumin 4.1  3.5 - 5.2 (g/dL)   AST 25  0 - 37 (U/L)    ALT 22  0 - 35 (U/L)   Alkaline Phosphatase 130 (*) 39 - 117 (U/L)   Total Bilirubin 0.9  0.3 - 1.2 (mg/dL)   GFR calc non Af Amer NOT CALCULATED  >60 (mL/min)   GFR calc Af Amer NOT CALCULATED  >60 (mL/min)  LIPASE, BLOOD      Component Value Range   Lipase 17  11 - 59 (U/L)  URINE MICROSCOPIC-ADD ON      Component Value Range   Squamous Epithelial / LPF FEW (*) RARE    WBC, UA 0-2  <3 (WBC/hpf)   RBC / HPF 3-6  <3 (RBC/hpf)   Bacteria, UA RARE  RARE    Ct Abdomen Pelvis W Contrast  03/14/2011  *RADIOLOGY REPORT*  Clinical Data: Right upper quadrant pain.  Vomiting.  CT ABDOMEN AND PELVIS WITH CONTRAST  Technique:  Multidetector CT imaging of the abdomen and pelvis was performed following the standard protocol during bolus administration of intravenous contrast.  Contrast: 100 ml Omnipaque-300  Comparison: Radiographs dated 03/01/2011  Findings: The liver, spleen, pancreas, adrenal glands, and kidneys are normal.  Gallbladder has been removed.  No significant biliary ductal dilatation.  The bowel appears normal including the terminal ileum.  Appendix is not visible.  Uterus has been removed. Ovaries are normal.  No significant osseous abnormality.  Moderate calcification in the abdominal aorta and iliac arteries.  IMPRESSION: No acute abnormalities of the abdomen or pelvis.  Appendix is not visualized.  Original Report Authenticated By: Gwynn Burly, M.D.   Dg Abd Acute W/chest  03/01/2011  *RADIOLOGY REPORT*  Clinical Data: Abdominal pain, nausea, vomiting and diarrhea.  ACUTE ABDOMEN SERIES (ABDOMEN 2 VIEW & CHEST 1 VIEW)  Comparison: 03/07/2010  Findings: Chest shows clear lungs.  No edema or infiltrates.  The abdominal films show no evidence of acute bowel obstruction or free air.  Clips are again noted from prior cholecystectomy.  No abnormal calcifications.  IMPRESSION: No acute findings.  Original Report Authenticated By: Reola Calkins, M.D.     Labs show leukocytosis which is  trending down over past 3 visits.  No significant electrolyte abnormality.  Urine not concentrated or with sign of infection All labs and radiological studies reviewed by me as well  Records Reviewed: Old medical records. Nursing notes.  Treatments: Antiemetics given. Dilaudid intravenous for pain with moderate relief. IV NS bolus given.  3:31 PM Pt states she is feeling somewhat improved, notified of lab and imaging results.  Will give another round of meds, another NS bolus 4:33 PM Pt signed out to Dr. Golda Acre who will reassess, give oral fluid challenge, expect discharge unless clinical course changes   Past Medical History  Diagnosis Date  . Hypertension     Past Surgical History  Procedure Date  . Abdominal hysterectomy   . Cholecystectomy     No family history on file.  History  Substance Use Topics  . Smoking status: Current Everyday Smoker    Types: Cigarettes  . Smokeless tobacco: Not on file  . Alcohol Use: Yes    OB History    Grav Para Term Preterm Abortions TAB SAB Ect Mult Living                  Review of Systems  Physical Exam  BP 142/88  Pulse 94  Temp(Src) 98.8 F (37.1 C) (Oral)  Resp 20  SpO2 98%  Physical Exam  ED Course  Procedures  MDM Pt c/o multiple episodes of vomiting- states and  per chart review she has similar symptoms chronically, due to worsening of symptoms over the past week and multiple ED visits CT scan obtained which showed no concerning findings. Will continue with hydration and reassess nausea/emesis.

## 2011-03-14 NOTE — ED Notes (Signed)
Was seen here earlier in the week with vomiting and right upper quad pain. No better today. Pain is unchanged.

## 2011-03-14 NOTE — ED Provider Notes (Signed)
Pt is feeling better at this point in time.  No further pain or nausea.  She feels ready to go home.  Will d/c home.    Nat Christen, MD 03/14/11 [phone]

## 2011-03-15 ENCOUNTER — Encounter (HOSPITAL_BASED_OUTPATIENT_CLINIC_OR_DEPARTMENT_OTHER): Payer: Self-pay | Admitting: *Deleted

## 2011-03-15 ENCOUNTER — Emergency Department (HOSPITAL_BASED_OUTPATIENT_CLINIC_OR_DEPARTMENT_OTHER)
Admission: EM | Admit: 2011-03-15 | Discharge: 2011-03-15 | Disposition: A | Discharge status: Other Acute Inpatient Hospital | Payer: Self-pay | Attending: Emergency Medicine | Admitting: Emergency Medicine

## 2011-03-15 ENCOUNTER — Inpatient Hospital Stay (HOSPITAL_COMMUNITY)
Admission: AD | Admit: 2011-03-15 | Discharge: 2011-03-17 | DRG: 392 | Disposition: A | Discharge status: Home / Self Care | Payer: Self-pay | Source: Other Acute Inpatient Hospital | Attending: Internal Medicine | Admitting: Internal Medicine

## 2011-03-15 DIAGNOSIS — R1115 Cyclical vomiting syndrome unrelated to migraine: Secondary | ICD-10-CM | POA: Diagnosis present

## 2011-03-15 DIAGNOSIS — G8929 Other chronic pain: Secondary | ICD-10-CM | POA: Diagnosis present

## 2011-03-15 DIAGNOSIS — I10 Essential (primary) hypertension: Secondary | ICD-10-CM | POA: Diagnosis present

## 2011-03-15 DIAGNOSIS — E876 Hypokalemia: Secondary | ICD-10-CM | POA: Diagnosis present

## 2011-03-15 DIAGNOSIS — R112 Nausea with vomiting, unspecified: Secondary | ICD-10-CM

## 2011-03-15 DIAGNOSIS — F17200 Nicotine dependence, unspecified, uncomplicated: Secondary | ICD-10-CM | POA: Diagnosis present

## 2011-03-15 DIAGNOSIS — E860 Dehydration: Secondary | ICD-10-CM

## 2011-03-15 DIAGNOSIS — R1011 Right upper quadrant pain: Principal | ICD-10-CM | POA: Diagnosis present

## 2011-03-15 LAB — CK TOTAL AND CKMB (NOT AT ARMC)
CK, MB: 1.2 ng/mL (ref 0.3–4.0)
Relative Index: INVALID (ref 0.0–2.5)
Relative Index: INVALID (ref 0.0–2.5)
Total CK: 35 U/L (ref 7–177)

## 2011-03-15 LAB — BASIC METABOLIC PANEL
GFR calc Af Amer: >60 mL/min (ref >60)
GFR calc non Af Amer: >60 mL/min (ref >60)
Glucose, Bld: 126 mg/dL — ABNORMAL HIGH (ref 70–99)
Potassium: 3.5 mEq/L (ref 3.5–5.1)
Sodium: 136 mEq/L (ref 135–145)

## 2011-03-15 LAB — DIFFERENTIAL
Basophils Relative: 0 % (ref 0–1)
Eosinophils Absolute: 0 10*3/uL (ref 0.0–0.7)
Lymphs Abs: 1 10*3/uL (ref 0.7–4.0)
Neutro Abs: 5.1 10*3/uL (ref 1.7–7.7)
Neutrophils Relative %: 76 % (ref 43–77)

## 2011-03-15 LAB — CBC
MCH: 31.7 pg (ref 26.0–34.0)
Platelets: 331 10*3/uL (ref 150–400)
RBC: 4.51 MIL/uL (ref 3.87–5.11)

## 2011-03-15 LAB — URINALYSIS, ROUTINE W REFLEX MICROSCOPIC
Leukocytes, UA: NEGATIVE
Protein, ur: 30 mg/dL — AB
Urobilinogen, UA: 1 mg/dL (ref 0.0–1.0)

## 2011-03-15 LAB — TROPONIN I
Troponin I: <0.3 ng/mL (ref <0.30)
Troponin I: <0.3 ng/mL (ref <0.30)

## 2011-03-15 LAB — URINE MICROSCOPIC-ADD ON

## 2011-03-15 LAB — LIPASE, BLOOD: Lipase: 46 U/L (ref 11–59)

## 2011-03-15 MED ORDER — ONDANSETRON HCL 4 MG/2ML IJ SOLN
4.0000 mg | Freq: Once | INTRAMUSCULAR | Status: AC
  Administered 2011-03-15: 4 mg via INTRAVENOUS
  Filled 2011-03-15: qty 2

## 2011-03-15 MED ORDER — HYDROMORPHONE HCL 2 MG/ML IJ SOLN
2.0000 mg | Freq: Once | INTRAMUSCULAR | Status: AC
  Administered 2011-03-15: 2 mg via INTRAVENOUS
  Filled 2011-03-15: qty 1

## 2011-03-15 MED ORDER — SODIUM CHLORIDE 0.9 % IV SOLN
INTRAVENOUS | Status: DC
  Administered 2011-03-15 (×2): via INTRAVENOUS

## 2011-03-15 NOTE — ED Notes (Signed)
Pt presents to ED today with continued c/o N/V abd pain.  Pt was seen here yesterday and released with rx for.  Pt states unable to tolerate "I just keep throwing up"  Pt states last emesis about 20 min ago PTA.

## 2011-03-15 NOTE — ED Provider Notes (Signed)
History     Chief Complaint  Patient presents with  . Nausea  . Emesis  . Abdominal Pain   HPI Comments: This is the 4th visit to [facility] ED in a week for this 55 year old woman, who presents with persistant vomiting and right upper quadrant pain.  When she left here yesterday and went to bed she started vomiting all over again.  She had had a negative CT scan of her abdomen and pelvis yesterday.  She had cholecystectomy about 12 years ago.  Since then she has had bouts of right upper abdominal pain and vomiting.  She has been to both [facility] and [facility] for evaluation of these symptoms without relief.  As she continued to be symptomatic, she returned to [facility] for re-evaluation.  Patient is a 55 y.o. female presenting with vomiting and abdominal pain. The history is provided by the patient and medical records. No language interpreter was used.  Emesis  This is a recurrent problem. The current episode started more than 1 week ago. The problem occurs 5 to 10 times per day. The problem has been gradually worsening. Associated symptoms include abdominal pain.  Abdominal Pain The primary symptoms of the illness include abdominal pain and vomiting.    Past Medical History  Diagnosis Date  . Hypertension     Past Surgical History  Procedure Date  . Abdominal hysterectomy   . Cholecystectomy     History reviewed. No pertinent family history.  History  Substance Use Topics  . Smoking status: Current Everyday Smoker    Types: Cigarettes  . Smokeless tobacco: Not on file  . Alcohol Use: Yes    OB History    Grav Para Term Preterm Abortions TAB SAB Ect Mult Living                  Review of Systems  Gastrointestinal: Positive for vomiting and abdominal pain.  All other systems reviewed and are negative.    Physical Exam  BP 182/99  Pulse 85  Temp(Src) 98.7 F (37.1 C) (Oral)  Resp 18  SpO2 100%  Physical Exam  Constitutional: She is oriented to person,  place, and time. She appears well-developed and well-nourished. She appears distressed.  HENT:  Head: Normocephalic and atraumatic.  Right Ear: External ear normal.  Left Ear: External ear normal.  Eyes: Conjunctivae and EOM are normal. Pupils are equal, round, and reactive to light. No scleral icterus.  Neck: Normal range of motion. Neck supple.  Cardiovascular: Normal rate, regular rhythm and normal heart sounds.   Pulmonary/Chest: Effort normal and breath sounds normal.  Abdominal: Soft. Distention: Mild RUQ tenderness, no mass, rebound or rigidity. There is tenderness.  Musculoskeletal: Normal range of motion.  Neurological: She is alert and oriented to person, place, and time. She has normal reflexes.  Skin: Skin is warm and dry.  Psychiatric: She has a normal mood and affect. Her behavior is normal.    ED Course  Procedures  MDM  Pt seen --> physical exam performed.  Old charts from this past week reviewed.  Lab workup, IV fluids, IV Dilaudid and Zofran ordered.  10:40 A.M.  CBC shows WBC 6,700 with normal diff.  BMET shows glucose mildly elevated at 126.    UA shows Ketones > 80.  Suggests dehydration.   Call to [facility] to admit her for recurrent vomiting, dehydration.    11:05 A.M.  Case discussed with Ted Mcalpine, M.D., who accepts her for transfer  to [facility].    Carleene Cooper III, MD 03/15/11 [id number]

## 2011-03-16 LAB — GLUCOSE, CAPILLARY
Glucose-Capillary: 82 mg/dL (ref 70–99)
Glucose-Capillary: 88 mg/dL (ref 70–99)
Glucose-Capillary: 90 mg/dL (ref 70–99)
Glucose-Capillary: 98 mg/dL (ref 70–99)
Glucose-Capillary: 99 mg/dL (ref 70–99)

## 2011-03-16 LAB — COMPREHENSIVE METABOLIC PANEL
ALT: 17 U/L (ref 0–35)
AST: 14 U/L (ref 0–37)
Albumin: 3.2 g/dL — ABNORMAL LOW (ref 3.5–5.2)
CO2: 25 mEq/L (ref 19–32)
Chloride: 102 mEq/L (ref 96–112)
Sodium: 135 mEq/L (ref 135–145)
Total Bilirubin: 0.4 mg/dL (ref 0.3–1.2)

## 2011-03-16 LAB — CBC
Hemoglobin: 12.2 g/dL (ref 12.0–15.0)
Platelets: 279 10*3/uL (ref 150–400)
RBC: 3.82 MIL/uL — ABNORMAL LOW (ref 3.87–5.11)
WBC: 5.8 10*3/uL (ref 4.0–10.5)

## 2011-03-16 LAB — CARDIAC PANEL(CRET KIN+CKTOT+MB+TROPI): Troponin I: <0.3 ng/mL (ref <0.30)

## 2011-03-17 LAB — CBC
HCT: 36.3 % (ref 36.0–46.0)
Hemoglobin: 12 g/dL (ref 12.0–15.0)
MCH: 31.5 pg (ref 26.0–34.0)
MCHC: 33.1 g/dL (ref 30.0–36.0)
RDW: 13.3 % (ref 11.5–15.5)

## 2011-03-17 LAB — BASIC METABOLIC PANEL
BUN: 6 mg/dL (ref 6–23)
Calcium: 8.5 mg/dL (ref 8.4–10.5)
Creatinine, Ser: <0.47 mg/dL — ABNORMAL LOW (ref 0.50–1.10)
Glucose, Bld: 115 mg/dL — ABNORMAL HIGH (ref 70–99)
Potassium: 3.9 mEq/L (ref 3.5–5.1)

## 2011-03-20 NOTE — H&P (Signed)
NAMEMarland Kitchen  Theresa Carey, Theresa Carey NO.:  [id number]  MEDICAL RECORD NO.:  [id number]  LOCATION:  [id number]                         FACILITY:  [facility]  PHYSICIAN:  Alvino Blood, MD      DATE OF BIRTH:  Jan 03, 1956  DATE OF ADMISSION:  03/15/2011 DATE OF DISCHARGE:                             HISTORY & PHYSICAL   PRIMARY CARE PHYSICIAN:  Dr. Allena Katz  CHIEF COMPLAINTS:  Nausea, vomiting and abdominal pain.  HISTORY OF PRESENT ILLNESS:  Theresa Carey is a 55 year old Caucasian female who has a history of recurrent nausea and vomiting with abdominal pain.  She was last admitted to this facility on Dec 26, 2010, and discharged on Dec 29, 2010, with similar complaint.  She presents with a 2-week history of nausea and vomiting with right upper quadrant pain, stating that she has been having intermittent emesis up to 15 episodes a day.  The emesis is nonbloody, occasionally green in color and sometimes brown or yellow, and it is not related to eating or drinking.  There are some days of the week that she goes free of vomiting.  She does not have any diarrhea.  The right upper quadrant pain has been ongoing intermittently for the past 12 years since she had cholecystectomy done in this facility, and for this, she has had investigations including ERCP, small-bowel followthrough in facilities such as the [facility] and the [facility].  Yesterday she went to the [facility] Emergency Room where she was evaluated.  A CT of the abdomen was done which was negative.  Her laboratory studies were unremarkable.  She was given IV Zofran and was discharged home. However, on getting home she stated that her nausea and vomiting abated for a while but last night it started again and she has had at least 15 episodes so she decided to come back to the emergency room where she was referred for further evaluation.  On further questioning, she admits to having had some fever a few  days back but not at this time.  She denies any chest pains, palpitations, shortness of breath, headaches, blurring of vision, slurring of her speech, neck pain or stiffness, or urinary symptoms such as dysuria or frequency.  PAST MEDICAL HISTORY: 1. Hypertension. 2. Chronic right upper quadrant pain following cholecystectomy about     12 years ago. 3. Intermittent chronic diarrhea. 4. Tobacco abuse disorder. 5. Recurrent hypokalemia following the diarrhea, nausea and vomiting.  PAST SURGICAL HISTORY: 1. Cholecystectomy 12 years ago. 2. Total abdominal hysterectomy.  ALLERGIES: 1. NONSTEROIDAL ANTI-INFLAMMATORY DRUGS. 2. AVELOX which causes low blood pressure. 3. MORPHINE with causes vomiting. 4. PHENERGAN which causes vomiting.  MEDICATIONS: 1. Dilaudid 4 mg orally every 8 hours as needed for pain. 2. Norvasc 10 mg orally daily. 3. Lisinopril 20 mg orally daily. 4. Aspirin enteric-coated 81 mg orally daily. 5. Ambien 10 mg orally daily at bedtime as needed for insomnia.  SOCIAL HISTORY:  She is divorced, lives alone, does not have any children.  She admits to chronic tobacco use.  Smokes about 10 sticks of cigarettes a day and she drinks alcohol socially but she does not report any history of illicit  drug use.  FAMILY HISTORY:  Her father had bladder cancer and also hypertension. Her mother had coronary artery bypass graft in 27s.  She also has hypertension.  REVIEW OF SYSTEMS:  All systems were reviewed and are negative except as mentioned in the history of presenting illness.  PHYSICAL EXAMINATION:  VITAL SIGNS:  Temperature 98.3, heart rate 74 per minute, respiratory rate 18 per minute, blood pressure 164/97, oxygen saturation 98% on room air. GENERAL:  This is a well-built Caucasian female who does not look acutely ill and not in any obvious distress.  She is alert and oriented x3. HEENT:  Head is normocephalic and atraumatic.  Pupils are equal and reactive to  light and accommodation.  Equal extraocular movements bilaterally. External ears and nose appears normal.  Oropharyngeal mucosa is moist. NECK:  Supple without thyromegaly, cervical adenopathy or jugular venous distention. CHEST:  Clear to auscultation without any wheezes or rhonchi.  Good entry on auscultation bilaterally. CARDIOVASCULAR:  Normal first and second heart sounds are heard with regular rate and rhythm without any murmurs, rubs or gallops. ABDOMEN:  Soft with normal bowel sounds. Right upper quadrant tenderness without any rebound tenderness or guarding.  No palpable hepatosplenomegaly. NEUROLOGIC:  Alert and oriented x3 without any focal neurological deficits. EXTREMITIES:  Peripheral pulses are palpable.  No evidence of peripheral edema, calf tenderness or tenosynovitis. SKIN:  Warm and dry without any erythema, rash or lesion.  LABORATORY AND IMAGING DATA:  CBC:  White cell count 6.7, hemoglobin 15.3, hematocrit 42, platelet count 331, neutrophils 76%.  Metabolic panel:  Sodium 136, potassium 3.5, chloride 95, CO2 27, BUN 8, creatinine 0.5, glucose 126.  Liver function test that was done yesterday at the emergency room shows alkaline phosphatase of 130, total bilirubin 0.9, AST 25, and ALT 22. CT of the abdomen and pelvis which was also done on 03/14/2011 shows no acute intra-abdominal abnormality.  The appendix was, however, not visualized.  ASSESSMENT: 1. Intractable nausea with vomiting. 2. Right upper quadrant abdominal pain, which is chronic. 3. Hypertension. 4. Chronic tobacco abuse disorder.  PLAN:  The patient will be admitted to the Medical service for further management which will include: 1. Being kept n.p.o. except medications. 2. She will be given IV Zofran as needed for nausea and vomiting. 3. She will continue her oral Dilaudid for pain and if she is unable     to tolerate this, it will be changed to IV. 4. She will also be placed on IV Protonix  for possible gastritis. 5. She will be on IV fluid hydration with normal saline with 20 of     potassium chloride. 6. All her other medications will be continued except the aspirin. 7. An upper endoscopy will be considered.  The patient's condition is guarded and she is a full code.          ______________________________ Alvino Blood, MD     SI/MEDQ  D:  03/15/2011  T:  03/15/2011  Job:  [id number]  cc:   Dr. Allena Katz  Electronically Signed by Alvino Blood MD on 03/20/2011 01:08:31 PM

## 2011-03-30 ENCOUNTER — Encounter (HOSPITAL_BASED_OUTPATIENT_CLINIC_OR_DEPARTMENT_OTHER): Payer: Self-pay | Admitting: Emergency Medicine

## 2011-03-30 ENCOUNTER — Emergency Department (INDEPENDENT_AMBULATORY_CARE_PROVIDER_SITE_OTHER): Payer: Self-pay

## 2011-03-30 ENCOUNTER — Emergency Department (HOSPITAL_BASED_OUTPATIENT_CLINIC_OR_DEPARTMENT_OTHER)
Admission: EM | Admit: 2011-03-30 | Discharge: 2011-03-30 | Disposition: A | Discharge status: Home / Self Care | Payer: Self-pay | Attending: Emergency Medicine | Admitting: Emergency Medicine

## 2011-03-30 DIAGNOSIS — R509 Fever, unspecified: Secondary | ICD-10-CM

## 2011-03-30 DIAGNOSIS — I10 Essential (primary) hypertension: Secondary | ICD-10-CM | POA: Insufficient documentation

## 2011-03-30 DIAGNOSIS — R1011 Right upper quadrant pain: Secondary | ICD-10-CM | POA: Insufficient documentation

## 2011-03-30 DIAGNOSIS — R1110 Vomiting, unspecified: Secondary | ICD-10-CM

## 2011-03-30 DIAGNOSIS — I700 Atherosclerosis of aorta: Secondary | ICD-10-CM

## 2011-03-30 DIAGNOSIS — R112 Nausea with vomiting, unspecified: Secondary | ICD-10-CM | POA: Insufficient documentation

## 2011-03-30 LAB — CBC
Hemoglobin: 12 g/dL (ref 12.0–15.0)
MCH: 31.7 pg (ref 26.0–34.0)
MCHC: 33.3 g/dL (ref 30.0–36.0)
Platelets: 263 10*3/uL (ref 150–400)
RDW: 13.1 % (ref 11.5–15.5)

## 2011-03-30 LAB — DIFFERENTIAL
Basophils Absolute: 0 10*3/uL (ref 0.0–0.1)
Basophils Relative: 0 % (ref 0–1)
Eosinophils Absolute: 0 10*3/uL (ref 0.0–0.7)
Monocytes Relative: 5 % (ref 3–12)
Neutro Abs: 7.5 10*3/uL (ref 1.7–7.7)
Neutrophils Relative %: 88 % — ABNORMAL HIGH (ref 43–77)

## 2011-03-30 LAB — COMPREHENSIVE METABOLIC PANEL
ALT: 11 U/L (ref 0–35)
AST: 12 U/L (ref 0–37)
Albumin: 4 g/dL (ref 3.5–5.2)
CO2: 23 mEq/L (ref 19–32)
Calcium: 9.7 mg/dL (ref 8.4–10.5)
Creatinine, Ser: 0.6 mg/dL (ref 0.50–1.10)
Sodium: 139 mEq/L (ref 135–145)

## 2011-03-30 LAB — URINALYSIS, ROUTINE W REFLEX MICROSCOPIC
Bilirubin Urine: NEGATIVE
Glucose, UA: NEGATIVE mg/dL
Specific Gravity, Urine: 1.026 (ref 1.005–1.030)
pH: 5.5 (ref 5.0–8.0)

## 2011-03-30 LAB — URINE MICROSCOPIC-ADD ON

## 2011-03-30 MED ORDER — ACETAMINOPHEN 325 MG PO TABS: 650.0000 mg | ORAL_TABLET | Freq: Once | ORAL | Status: DC

## 2011-03-30 MED ORDER — ONDANSETRON HCL 4 MG/2ML IJ SOLN
4.0000 mg | Freq: Once | INTRAMUSCULAR | Status: AC
  Administered 2011-03-30: 4 mg via INTRAVENOUS
  Filled 2011-03-30: qty 2

## 2011-03-30 MED ORDER — ONDANSETRON HCL 4 MG PO TABS: 4.0000 mg | ORAL_TABLET | Freq: Four times a day (QID) | ORAL | Status: AC

## 2011-03-30 MED ORDER — SODIUM CHLORIDE 0.9 % IV SOLN
INTRAVENOUS | Status: DC
  Administered 2011-03-30: 13:00:00 via INTRAVENOUS

## 2011-03-30 MED ORDER — OXYCODONE-ACETAMINOPHEN 5-325 MG PO TABS: 1.0000 | ORAL_TABLET | ORAL | Status: AC | PRN

## 2011-03-30 MED ORDER — HYDROMORPHONE HCL 2 MG/ML IJ SOLN
2.0000 mg | Freq: Once | INTRAMUSCULAR | Status: AC
  Administered 2011-03-30: 2 mg via INTRAVENOUS
  Filled 2011-03-30: qty 1

## 2011-03-30 NOTE — ED Provider Notes (Signed)
History     CSN: [id number] Arrival date & time: 03/30/2011 11:57 AM  Chief Complaint  Patient presents with  . Abdominal Pain   HPI Comments: The patient is a 55 year old woman with recurring bouts of right upper abdominal pain. She has been seen several times this year, and was hospitalized last month at [facility] for these symptoms.  She had done well until this morning, when she had recurrence of right upper quadrant pain, nausea vomiting, and temperature 102.8. She took Zofran and Percocet, as well as Tylenol for fever, without relief. She therefore sought evaluation.  Patient is a 55 y.o. female presenting with abdominal pain. The history is provided by the patient and medical records. No language interpreter was used.  Abdominal Pain The primary symptoms of the illness include abdominal pain, fever, nausea and vomiting. The primary symptoms of the illness do not include diarrhea, dysuria or vaginal discharge. The current episode started 3 to 5 hours ago. The onset of the illness was sudden. Progression since onset: She is having recurring bouts of nausea and vomiting.  The abdominal pain began 3 to 5 hours ago. The pain came on suddenly. The abdominal pain is located in the RUQ. The abdominal pain does not radiate. The severity of the abdominal pain is 7/10. The abdominal pain is relieved by nothing. The abdominal pain is exacerbated by vomiting.  The fever began 2 days ago. The maximum temperature recorded prior to her arrival was 102 to 102.9 F. The temperature was taken by an oral thermometer.  The patient states that she believes she is currently not pregnant. The patient has not had a change in bowel habit. Additional symptoms associated with the illness include chills.    Past Medical History  Diagnosis Date  . Hypertension     Past Surgical History  Procedure Date  . Abdominal hysterectomy   . Cholecystectomy     No family history on file.  History    Substance Use Topics  . Smoking status: Current Everyday Smoker    Types: Cigarettes  . Smokeless tobacco: Not on file  . Alcohol Use: Yes    OB History    Grav Para Term Preterm Abortions TAB SAB Ect Mult Living                  Review of Systems  Constitutional: Positive for fever and chills.  HENT: Negative.   Eyes: Negative.   Respiratory: Negative.   Cardiovascular: Negative.   Gastrointestinal: Positive for nausea, vomiting and abdominal pain. Negative for diarrhea.  Genitourinary: Negative.  Negative for dysuria and vaginal discharge.  Musculoskeletal: Negative.   Neurological: Negative.   Psychiatric/Behavioral: Negative.     Physical Exam  BP 154/86  Pulse 127  Temp(Src) 101.6 F (38.7 C) (Oral)  Resp 16  Ht 5\' 6"  (1.676 m)  Wt 140 lb (63.504 kg)  BMI 22.60 kg/m2  SpO2 98%  Physical Exam  Vitals reviewed. Constitutional: She is oriented to person, place, and time. She appears well-developed and well-nourished. No distress.  HENT:  Head: Normocephalic and atraumatic.  Right Ear: External ear normal.  Left Ear: External ear normal.  Eyes: Conjunctivae and EOM are normal. Pupils are equal, round, and reactive to light.  Neck: Normal range of motion. Neck supple.  Cardiovascular: Normal rate.   Pulmonary/Chest: Effort normal and breath sounds normal.  Abdominal: Soft. Bowel sounds are normal.  Musculoskeletal: Normal range of motion.  Neurological: She is alert and oriented to  person, place, and time.       No sensory or motor deficits.  Skin: Skin is dry.  Psychiatric: She has a normal mood and affect. Her behavior is normal.    ED Course  Procedures  Course in the ED the patient was seen and had physical exam performed. Old charts were reviewed. IV fluids, IV Zofran, and IV Dilaudid were ordered. Laboratory workup was ordered.  2:31 PM Patient's lab test showed no serious cause of fever. She continues with abdominal pain. We will prescribe her  one more Dilaudid and Zofran injection, and then plan to release her.  Carleene Cooper III, MD 03/30/11 [phone]

## 2011-03-30 NOTE — ED Notes (Signed)
Pt c/o RUQ pain since 4am; +NV

## 2011-03-31 LAB — URINE CULTURE
Colony Count: NO GROWTH
Culture  Setup Time: 201208060014
Culture: NO GROWTH

## 2011-04-05 NOTE — Discharge Summary (Signed)
  NAMEMarland Kitchen  JAZYAH, BUTSCH NO.:  [id number]  MEDICAL RECORD NO.:  [id number]  LOCATION:  1536                         FACILITY:  [facility]  PHYSICIAN:  Peggye Pitt, M.D. DATE OF BIRTH:  09/18/1955  DATE OF ADMISSION:  03/15/2011 DATE OF DISCHARGE:  03/17/2011                              DISCHARGE SUMMARY   PRIMARY CARE PHYSICIAN:  Lyn Hollingshead, MD with [facility].  DISCHARGE DIAGNOSES: 1. Acute on chronic abdominal pain, nausea and vomiting, resolved. 2. Hypokalemia, repleted. 3. Hypertension. 4. Tobacco abuse.  DISCHARGE MEDICATIONS: 1. Percocet 5/325 mg 1 to 2 tablets every 4 hours as needed for pain. 2. Ambien 10 mg at bedtime as needed for insomnia. 3. Lisinopril 20 mg daily. 4. Norvasc 10 mg daily.  DISPOSITION AND FOLLOWUP:  Theresa Carey was discharged home today in stable and improved condition.  She is instructed to follow up with her PCP as previously scheduled.  CONSULTATION THIS HOSPITALIZATION:  None.  IMAGES AND PROCEDURES:  Include a CT scan of the abdomen and pelvis on March 14, 2011, that showed no acute abnormalities with small visualized appendix.  HISTORY AND PHYSICAL:  For full details, please see dictation on March 15, 2011, by Dr. Marylu Lund but in brief Theresa Carey is a 55 year old lady with a history of recurrent nausea and vomiting of unknown origin.  She has had a thorough workup at outside facilities with unidentified source.  She returns after her fourth ED visit and we are asked to admit her at this time.  HOSPITAL COURSE BY PROBLEM: 1. Acute on chronic abdominal pain, nausea and vomiting.  She was     initially kept n.p.o., given IV pain medication and antiemetics.     Over the course of the last 36 hours, she has improved to the point     where she feels she is back at her baseline.  Hopefully, we will     be discharging her home today.  Please note that she has had a     thorough workup in the past including small  bowel follow through,     barium swallows, EGD and colonoscopies with her primary     gastroenterologist Dr. Loreta Ave as well as at [facility] and [place].     There is at this moment no concise etiology for her symptoms.  Rest of her chronic conditions are stable.  Her home medications have not been altered.  Vitals on day of discharge, blood pressure 117/65, heart rate 71, respirations 20, temperature of 98.4, sats 98% on room air.     Peggye Pitt, M.D.     EH/MEDQ  D:  03/17/2011  T:  03/17/2011  Job:  [id number]  cc:   Lyn Hollingshead, MD  Electronically Signed by Peggye Pitt M.D. on 04/05/2011 05:13:25 PM

## 2011-04-12 ENCOUNTER — Emergency Department (HOSPITAL_BASED_OUTPATIENT_CLINIC_OR_DEPARTMENT_OTHER)
Admission: EM | Admit: 2011-04-12 | Discharge: 2011-04-12 | Disposition: A | Discharge status: Home / Self Care | Payer: Self-pay | Attending: Emergency Medicine | Admitting: Emergency Medicine

## 2011-04-12 ENCOUNTER — Encounter (HOSPITAL_BASED_OUTPATIENT_CLINIC_OR_DEPARTMENT_OTHER): Payer: Self-pay | Admitting: *Deleted

## 2011-04-12 ENCOUNTER — Encounter (HOSPITAL_BASED_OUTPATIENT_CLINIC_OR_DEPARTMENT_OTHER): Payer: Self-pay | Admitting: Emergency Medicine

## 2011-04-12 DIAGNOSIS — Z79899 Other long term (current) drug therapy: Secondary | ICD-10-CM | POA: Insufficient documentation

## 2011-04-12 DIAGNOSIS — G8929 Other chronic pain: Secondary | ICD-10-CM | POA: Insufficient documentation

## 2011-04-12 DIAGNOSIS — R112 Nausea with vomiting, unspecified: Secondary | ICD-10-CM | POA: Insufficient documentation

## 2011-04-12 DIAGNOSIS — R1011 Right upper quadrant pain: Secondary | ICD-10-CM | POA: Insufficient documentation

## 2011-04-12 DIAGNOSIS — R109 Unspecified abdominal pain: Secondary | ICD-10-CM

## 2011-04-12 DIAGNOSIS — I10 Essential (primary) hypertension: Secondary | ICD-10-CM | POA: Insufficient documentation

## 2011-04-12 DIAGNOSIS — F17200 Nicotine dependence, unspecified, uncomplicated: Secondary | ICD-10-CM | POA: Insufficient documentation

## 2011-04-12 DIAGNOSIS — T40605A Adverse effect of unspecified narcotics, initial encounter: Secondary | ICD-10-CM

## 2011-04-12 LAB — URINALYSIS, ROUTINE W REFLEX MICROSCOPIC
Glucose, UA: NEGATIVE mg/dL
Leukocytes, UA: NEGATIVE
Protein, ur: NEGATIVE mg/dL
Specific Gravity, Urine: 1.011 (ref 1.005–1.030)
pH: 5.5 (ref 5.0–8.0)

## 2011-04-12 LAB — DIFFERENTIAL
Basophils Absolute: 0.1 10*3/uL (ref 0.0–0.1)
Lymphocytes Relative: 33 % (ref 12–46)
Lymphs Abs: 3.4 10*3/uL (ref 0.7–4.0)
Monocytes Absolute: 0.7 10*3/uL (ref 0.1–1.0)
Neutro Abs: 5.9 10*3/uL (ref 1.7–7.7)

## 2011-04-12 LAB — COMPREHENSIVE METABOLIC PANEL
ALT: 17 U/L (ref 0–35)
AST: 13 U/L (ref 0–37)
CO2: 24 mEq/L (ref 19–32)
Chloride: 106 mEq/L (ref 96–112)
GFR calc non Af Amer: >60 mL/min (ref >60)
Sodium: 143 mEq/L (ref 135–145)
Total Bilirubin: 0.2 mg/dL — ABNORMAL LOW (ref 0.3–1.2)

## 2011-04-12 LAB — CBC
HCT: 40.3 % (ref 36.0–46.0)
Platelets: 462 10*3/uL — ABNORMAL HIGH (ref 150–400)
RBC: 4.3 MIL/uL (ref 3.87–5.11)
RDW: 13.7 % (ref 11.5–15.5)
WBC: 10.2 10*3/uL (ref 4.0–10.5)

## 2011-04-12 MED ORDER — ONDANSETRON HCL 4 MG/2ML IJ SOLN
4.0000 mg | Freq: Once | INTRAMUSCULAR | Status: AC
  Administered 2011-04-12: 4 mg via INTRAVENOUS
  Filled 2011-04-12: qty 2

## 2011-04-12 MED ORDER — METOCLOPRAMIDE HCL 5 MG/ML IJ SOLN
10.0000 mg | Freq: Once | INTRAMUSCULAR | Status: DC
  Filled 2011-04-12: qty 2

## 2011-04-12 MED ORDER — SODIUM CHLORIDE 0.9 % IV BOLUS (SEPSIS)
1000.0000 mL | Freq: Once | INTRAVENOUS | Status: AC
  Administered 2011-04-12: 1000 mL via INTRAVENOUS

## 2011-04-12 MED ORDER — ONDANSETRON 4 MG PO TBDP
4.0000 mg | ORAL_TABLET | Freq: Once | ORAL | Status: AC
  Administered 2011-04-12: 4 mg via ORAL
  Filled 2011-04-12: qty 1

## 2011-04-12 MED ORDER — KETOROLAC TROMETHAMINE 60 MG/2ML IM SOLN
60.0000 mg | Freq: Once | INTRAMUSCULAR | Status: AC
  Administered 2011-04-12: 60 mg via INTRAMUSCULAR
  Filled 2011-04-12: qty 2

## 2011-04-12 MED ORDER — ONDANSETRON 4 MG PO TBDP: 4.0000 mg | ORAL_TABLET | Freq: Three times a day (TID) | ORAL | Status: AC | PRN

## 2011-04-12 NOTE — ED Provider Notes (Signed)
History   Scribed for Vida Roller, MD, the patient was seen in room MH02/MH02 . This chart was scribed by Desma Paganini. This patient's care was started at 7:20 PM .    CSN: [id number] Arrival date & time: 04/12/2011  7:11 PM  Chief Complaint  Patient presents with  . Abdominal Pain   HPI  Theresa Carey is a 55 y.o. female who presents to the Emergency Department complaining of RUQ abdominal pain. She also c/o nausea and vomiting.  The pain has been chronic since having her gall bladder removed 11 years prior. She has normal bowel movements and denies diarrhea, blood in stool, vaginal discharge, and vaginal bleeding. She is not diaphoretic, has no acute distress and has no respiratory distress.  She was in ER earlier and wanted Dilaudid for her pain but it was not given to her at the time. Symptoms are constant, mild, nothing makes better or worse. He denies fevers, back pain, headache  PCP: Dr. Allena Katz  HPI ELEMENTS:  Location: RUQ abdomen  Onset: chronic pain  Timing: constant  Context: as above  Associated symptoms: has had problems since having her gallbladder removed 1 year ago     PAST MEDICAL HISTORY:  Past Medical History  Diagnosis Date  . Hypertension      PAST SURGICAL HISTORY:  Past Surgical History  Procedure Date  . Abdominal hysterectomy   . Cholecystectomy     MEDICATIONS:  Previous Medications   AMLODIPINE (NORVASC) 10 MG TABLET    Take 10 mg by mouth daily.     HYDROMORPHONE HCL (DILAUDID PO)    Take by mouth.     LISINOPRIL (PRINIVIL,ZESTRIL) 20 MG TABLET    Take 20 mg by mouth daily.     ONDANSETRON (ZOFRAN) 4 MG TABLET    Take 4 mg by mouth every 8 (eight) hours as needed. nausea   OXYCODONE-ACETAMINOPHEN (PERCOCET) 5-325 MG PER TABLET    Take 1 tablet by mouth every 4 (four) hours as needed. pain   POTASSIUM CHLORIDE SA (K-DUR,KLOR-CON) 20 MEQ TABLET    Take 20 mEq by mouth 2 (two) times daily.     ZOLPIDEM (AMBIEN) 10 MG TABLET    Take 10 mg by  mouth at bedtime as needed. sleep      ALLERGIES:  Allergies as of 04/12/2011 - Review Complete 04/12/2011  Allergen Reaction Noted  . Avelox (moxifloxacin hcl in nacl)  03/01/2011  . Diuretic (buchu-cornsilk-ch grass-hydran)  04/12/2011  . Ibuprofen Hives 03/01/2011  . Morphine and related Nausea And Vomiting 03/01/2011  . Phenergan  03/01/2011     FAMILY HISTORY:  History reviewed. No pertinent family history.   SOCIAL HISTORY:  History  Substance Use Topics  . Smoking status: Current Everyday Smoker    Types: Cigarettes  . Smokeless tobacco: Not on file  . Alcohol Use: Yes   Review of Systems 10 Systems reviewed and are negative for acute change except as noted in the HPI.  Physical Exam  BP 128/82  Pulse 98  Temp(Src) 98.3 F (36.8 C) (Oral)  Resp 20  Ht 5' 5.5" (1.664 m)  Wt 140 lb (63.504 kg)  BMI 22.94 kg/m2  SpO2 98%  Physical Exam  Nursing note and vitals reviewed. Constitutional: She is oriented to person, place, and time. She appears well-developed and well-nourished. No distress.  HENT:  Head: Atraumatic.  Eyes: Conjunctivae and EOM are normal.  Neck: Normal range of motion.  Cardiovascular: Normal rate.   Pulmonary/Chest: Effort normal.  No respiratory distress.  Abdominal: Bowel sounds are normal. She exhibits no distension. There is no tenderness.  Musculoskeletal: Normal range of motion. She exhibits no edema and no tenderness.  Neurological: She is alert and oriented to person, place, and time.  Skin: Skin is warm and dry. No rash noted. She is not diaphoretic.  Psychiatric: She has a normal mood and affect. Her behavior is normal.       Speech normal     ED Course  Procedures  MDM Chronic pain, no significant tenderness on my exam. Nausea vomiting and pain treated with Zofran and Toradol. Patient understands that she will not receive ongoing repeat dilaudid doses for her chronic pain. I have referred her to a pain clinic. I have reviewed  her nose from prior visits and find no acute findings including prior to today.  I personally performed the services described in this documentation, which was scribed in my presence. The recorded information has been reviewed and considered. Eber Hong MD      Vida Roller, MD 04/12/11 [phone]

## 2011-04-12 NOTE — ED Notes (Signed)
Pt c/o NV since 4 am today; chronic problem per pt.  Also c/o RUQ pain.

## 2011-04-12 NOTE — ED Notes (Signed)
Pt seen here earlier and tests were done, but she said they usually give her narcotics and they didn't this time. C/O RUQ pain. Has had problems since having her gall bladder removed 12 years ago.

## 2011-04-12 NOTE — ED Provider Notes (Addendum)
History     CSN: [id number] Arrival date & time: 04/12/2011 11:14 AM  Chief Complaint  Patient presents with  . Nausea  . Emesis   HPI Abdominal pain.  Patient with a 55 y.o. History of ruq pain.  Patient admitted and given iv narcotics last month.  No source found. Patient states pain awoke her at 0400 this morning and vomiting.  No fever, chills, urinary tract infection.    Past Medical History  Diagnosis Date  . Hypertension     Past Surgical History  Procedure Date  . Abdominal hysterectomy   . Cholecystectomy     No family history on file.  History  Substance Use Topics  . Smoking status: Current Everyday Smoker    Types: Cigarettes  . Smokeless tobacco: Not on file  . Alcohol Use: Yes    OB History    Grav Para Term Preterm Abortions TAB SAB Ect Mult Living                  Review of Systems  All other systems reviewed and are negative.    Physical Exam  BP 143/86  Pulse 117  Temp(Src) 98.6 F (37 C) (Oral)  Resp 18  Ht 5\' 5"  (1.651 m)  Wt 140 lb (63.504 kg)  BMI 23.30 kg/m2  SpO2 99%  Physical Exam  Constitutional: She is oriented to person, place, and time. She appears well-developed and well-nourished.  HENT:  Head: Normocephalic.  Eyes: Pupils are equal, round, and reactive to light.  Neck: Normal range of motion.  Cardiovascular: Normal rate.   Pulmonary/Chest: Effort normal.  Abdominal: Soft. She exhibits no distension and no mass. There is no tenderness. There is no rebound and no guarding.  Musculoskeletal: Normal range of motion.  Neurological: She is alert and oriented to person, place, and time.  Skin: Skin is warm and dry.    ED Course  Procedures  MDM Patient states that dilaudid is the only thing that works for her.  She refuses reglan. Labs normal- patient with multiple recent visits for same and greater than 170 narcotics pills dispensed in past month.        Hilario Quarry, MD 04/13/11 [id number]  Hilario Quarry,  MD 04/13/11 [phone]

## 2011-06-06 LAB — CBC
MCHC: 34.7
RDW: 14.7 — ABNORMAL HIGH

## 2011-06-06 LAB — BASIC METABOLIC PANEL
BUN: 12
CO2: 22
Calcium: 9.6
Creatinine, Ser: 0.67
Glucose, Bld: 123 — ABNORMAL HIGH

## 2011-06-06 LAB — DIFFERENTIAL
Basophils Absolute: 0
Eosinophils Absolute: 0
Lymphocytes Relative: 13
Neutro Abs: 6.2
Neutrophils Relative %: 81 — ABNORMAL HIGH
Smear Review: ADEQUATE

## 2011-06-12 LAB — COMPREHENSIVE METABOLIC PANEL
ALT: 76 — ABNORMAL HIGH
AST: 35
Albumin: 4.5
BUN: 12
CO2: 22
Chloride: 101
Glucose, Bld: 140 — ABNORMAL HIGH
Potassium: 3.2 — ABNORMAL LOW
Total Protein: 7.8

## 2011-06-12 LAB — URINALYSIS, ROUTINE W REFLEX MICROSCOPIC
Glucose, UA: NEGATIVE
Ketones, ur: 40 — AB
Leukocytes, UA: NEGATIVE
Protein, ur: NEGATIVE

## 2011-06-12 LAB — CBC
HCT: 44.6
Hemoglobin: 15.1 — ABNORMAL HIGH
RBC: 4.52
RDW: 15 — ABNORMAL HIGH

## 2011-06-12 LAB — URINE MICROSCOPIC-ADD ON

## 2011-06-12 LAB — DIFFERENTIAL
Eosinophils Absolute: 0
Eosinophils Relative: 0
Lymphocytes Relative: 10 — ABNORMAL LOW
Lymphs Abs: 1.4
Monocytes Absolute: 0.4
Monocytes Relative: 3

## 2011-06-12 LAB — LIPASE, BLOOD: Lipase: 15

## 2011-07-29 ENCOUNTER — Emergency Department (HOSPITAL_BASED_OUTPATIENT_CLINIC_OR_DEPARTMENT_OTHER)
Admission: EM | Admit: 2011-07-29 | Discharge: 2011-07-29 | Disposition: A | Discharge status: Home / Self Care | Payer: Self-pay | Attending: Emergency Medicine | Admitting: Emergency Medicine

## 2011-07-29 ENCOUNTER — Encounter (HOSPITAL_BASED_OUTPATIENT_CLINIC_OR_DEPARTMENT_OTHER): Payer: Self-pay

## 2011-07-29 DIAGNOSIS — Z79899 Other long term (current) drug therapy: Secondary | ICD-10-CM | POA: Insufficient documentation

## 2011-07-29 DIAGNOSIS — F17200 Nicotine dependence, unspecified, uncomplicated: Secondary | ICD-10-CM | POA: Insufficient documentation

## 2011-07-29 DIAGNOSIS — R109 Unspecified abdominal pain: Secondary | ICD-10-CM | POA: Insufficient documentation

## 2011-07-29 DIAGNOSIS — R1110 Vomiting, unspecified: Secondary | ICD-10-CM | POA: Insufficient documentation

## 2011-07-29 DIAGNOSIS — I10 Essential (primary) hypertension: Secondary | ICD-10-CM | POA: Insufficient documentation

## 2011-07-29 LAB — CBC
MCH: 32.2 pg (ref 26.0–34.0)
MCHC: 34.5 g/dL (ref 30.0–36.0)
RDW: 13.2 % (ref 11.5–15.5)

## 2011-07-29 LAB — DIFFERENTIAL
Basophils Absolute: 0 10*3/uL (ref 0.0–0.1)
Basophils Relative: 0 % (ref 0–1)
Eosinophils Absolute: 0 10*3/uL (ref 0.0–0.7)
Monocytes Absolute: 0.5 10*3/uL (ref 0.1–1.0)
Monocytes Relative: 5 % (ref 3–12)
Neutro Abs: 7.3 10*3/uL (ref 1.7–7.7)
Neutrophils Relative %: 76 % (ref 43–77)

## 2011-07-29 LAB — URINE MICROSCOPIC-ADD ON

## 2011-07-29 LAB — COMPREHENSIVE METABOLIC PANEL
Albumin: 4.9 g/dL (ref 3.5–5.2)
Alkaline Phosphatase: 144 U/L — ABNORMAL HIGH (ref 39–117)
BUN: 16 mg/dL (ref 6–23)
Calcium: 10.2 mg/dL (ref 8.4–10.5)
GFR calc Af Amer: >90 mL/min (ref >90)
Glucose, Bld: 143 mg/dL — ABNORMAL HIGH (ref 70–99)
Potassium: 3.9 mEq/L (ref 3.5–5.1)
Sodium: 142 mEq/L (ref 135–145)
Total Protein: 8.1 g/dL (ref 6.0–8.3)

## 2011-07-29 LAB — URINALYSIS, ROUTINE W REFLEX MICROSCOPIC
Glucose, UA: NEGATIVE mg/dL
Nitrite: NEGATIVE
Specific Gravity, Urine: 1.028 (ref 1.005–1.030)
pH: 6 (ref 5.0–8.0)

## 2011-07-29 MED ORDER — HYDROMORPHONE HCL PF 1 MG/ML IJ SOLN
1.0000 mg | Freq: Once | INTRAMUSCULAR | Status: AC
  Administered 2011-07-29: 1 mg via INTRAVENOUS
  Filled 2011-07-29: qty 1

## 2011-07-29 MED ORDER — ONDANSETRON HCL 4 MG PO TABS: 4.0000 mg | ORAL_TABLET | Freq: Three times a day (TID) | ORAL | Status: DC | PRN

## 2011-07-29 MED ORDER — LORAZEPAM 2 MG/ML IJ SOLN: 1.0000 mg | Freq: Once | INTRAMUSCULAR | Status: DC

## 2011-07-29 MED ORDER — ONDANSETRON HCL 4 MG/2ML IJ SOLN
4.0000 mg | Freq: Once | INTRAMUSCULAR | Status: AC
  Administered 2011-07-29: 4 mg via INTRAVENOUS
  Filled 2011-07-29: qty 2

## 2011-07-29 MED ORDER — LORAZEPAM 2 MG/ML IJ SOLN
2.0000 mg | Freq: Once | INTRAMUSCULAR | Status: AC
  Administered 2011-07-29: 2 mg via INTRAMUSCULAR
  Filled 2011-07-29: qty 1

## 2011-07-29 MED ORDER — ONDANSETRON 4 MG PO TBDP: 4.0000 mg | ORAL_TABLET | Freq: Three times a day (TID) | ORAL | Status: AC | PRN

## 2011-07-29 MED ORDER — SODIUM CHLORIDE 0.9 % IV SOLN
Freq: Once | INTRAVENOUS | Status: AC
  Administered 2011-07-29: 14:00:00 via INTRAVENOUS

## 2011-07-29 NOTE — ED Provider Notes (Signed)
History     CSN: [id number] Arrival date & time: 07/29/2011 12:51 PM   First MD Initiated Contact with Patient 07/29/11 1302      Chief Complaint  Patient presents with  . Nausea  . Emesis  . Abdominal Pain    (Consider location/radiation/quality/duration/timing/severity/associated sxs/prior treatment) Patient is a 55 y.o. female presenting with vomiting. The history is provided by the patient. No language interpreter was used.  Emesis  This is a recurrent problem. The problem occurs more than 10 times per day. The problem has been rapidly worsening. The emesis has an appearance of stomach contents. There has been no fever. Associated symptoms include abdominal pain and diarrhea. Risk factors: chronic problems.    Past Medical History  Diagnosis Date  . Hypertension     Past Surgical History  Procedure Date  . Abdominal hysterectomy   . Cholecystectomy     No family history on file.  History  Substance Use Topics  . Smoking status: Current Everyday Smoker -- 0.5 packs/day    Types: Cigarettes  . Smokeless tobacco: Never Used  . Alcohol Use: Yes     socially    OB History    Grav Para Term Preterm Abortions TAB SAB Ect Mult Living                  Review of Systems  Gastrointestinal: Positive for vomiting, abdominal pain and diarrhea.  All other systems reviewed and are negative.    Allergies  Avelox; Diuretic; Ibuprofen; Morphine and related; and Phenergan  Home Medications   Current Outpatient Rx  Name Route Sig Dispense Refill  . AMLODIPINE BESYLATE 10 MG PO TABS Oral Take 10 mg by mouth daily.      Marland Kitchen DILAUDID PO Oral Take by mouth.      Marland Kitchen LISINOPRIL 20 MG PO TABS Oral Take 20 mg by mouth daily.      Marland Kitchen ONDANSETRON HCL 4 MG PO TABS Oral Take 4 mg by mouth every 8 (eight) hours as needed. nausea    . OXYCODONE-ACETAMINOPHEN 5-325 MG PO TABS Oral Take 1 tablet by mouth every 4 (four) hours as needed. pain    . POTASSIUM CHLORIDE CRYS CR 20 MEQ PO TBCR  Oral Take 20 mEq by mouth 2 (two) times daily.      Marland Kitchen ZOLPIDEM TARTRATE 10 MG PO TABS Oral Take 10 mg by mouth at bedtime as needed. sleep       BP 162/86  Pulse 87  Temp(Src) 99.2 F (37.3 C) (Oral)  Resp 16  Ht 5\' 5"  (1.651 m)  Wt 140 lb 10.5 oz (63.8 kg)  BMI 23.41 kg/m2  SpO2 100%  Physical Exam  Nursing note and vitals reviewed. Constitutional: She is oriented to person, place, and time. She appears well-developed and well-nourished.  HENT:  Head: Normocephalic and atraumatic.  Right Ear: External ear normal.  Left Ear: External ear normal.  Nose: Nose normal.  Mouth/Throat: Oropharynx is clear and moist.  Eyes: Conjunctivae are normal. Pupils are equal, round, and reactive to light.  Neck: Normal range of motion. Neck supple.  Cardiovascular: Normal rate.   Pulmonary/Chest: Effort normal.  Abdominal: Soft.  Musculoskeletal: Normal range of motion.  Neurological: She is alert and oriented to person, place, and time. She has normal reflexes.  Skin: Skin is warm.  Psychiatric: She has a normal mood and affect.    ED Course  Procedures (including critical care time)  Labs Reviewed  URINALYSIS, ROUTINE W REFLEX MICROSCOPIC -  Abnormal; Notable for the following:    Color, Urine AMBER (*) BIOCHEMICALS MAY BE AFFECTED BY COLOR   Hgb urine dipstick SMALL (*)    Bilirubin Urine SMALL (*)    Ketones, ur 15 (*)    Protein, ur 30 (*)    All other components within normal limits  CBC - Abnormal; Notable for the following:    Platelets 418 (*)    All other components within normal limits  COMPREHENSIVE METABOLIC PANEL - Abnormal; Notable for the following:    Glucose, Bld 143 (*)    Alkaline Phosphatase 144 (*)    All other components within normal limits  URINE MICROSCOPIC-ADD ON - Abnormal; Notable for the following:    Bacteria, UA FEW (*)    All other components within normal limits  DIFFERENTIAL  LIPASE, BLOOD   No results found.   No diagnosis  found.    MDM  Pt retching continously.  Pt given ativan 2 mg IM.   Nurse able to start IV and give dilaudid and zofran.  Pt reports feeling better,  Urine shows 15 ketones,  No sign of significant dehydration.    I advised pt to see her MD for recheck.  Pt is to return if any problems.       Langston Masker, [place] 07/29/11 [phone]

## 2011-07-29 NOTE — ED Notes (Signed)
Pt reports abdominal pain, nausea that vomiting that started Saturday

## 2011-07-29 NOTE — ED Provider Notes (Signed)
Medical screening examination/treatment/procedure(s) were performed by non-physician practitioner and as supervising physician I was immediately available for consultation/collaboration.   Lyanne Co, MD 07/29/11 2128

## 2011-09-28 ENCOUNTER — Emergency Department (HOSPITAL_BASED_OUTPATIENT_CLINIC_OR_DEPARTMENT_OTHER)
Admission: EM | Admit: 2011-09-28 | Discharge: 2011-09-28 | Disposition: A | Discharge status: Home / Self Care | Payer: Self-pay | Attending: Emergency Medicine | Admitting: Emergency Medicine

## 2011-09-28 ENCOUNTER — Encounter (HOSPITAL_BASED_OUTPATIENT_CLINIC_OR_DEPARTMENT_OTHER): Payer: Self-pay | Admitting: *Deleted

## 2011-09-28 DIAGNOSIS — R1011 Right upper quadrant pain: Secondary | ICD-10-CM | POA: Insufficient documentation

## 2011-09-28 DIAGNOSIS — R1110 Vomiting, unspecified: Secondary | ICD-10-CM | POA: Insufficient documentation

## 2011-09-28 DIAGNOSIS — G8929 Other chronic pain: Secondary | ICD-10-CM | POA: Insufficient documentation

## 2011-09-28 DIAGNOSIS — R109 Unspecified abdominal pain: Secondary | ICD-10-CM

## 2011-09-28 MED ORDER — SODIUM CHLORIDE 0.9 % IV SOLN: INTRAVENOUS | Status: DC

## 2011-09-28 MED ORDER — HYDROMORPHONE HCL PF 1 MG/ML IJ SOLN
1.0000 mg | Freq: Once | INTRAMUSCULAR | Status: AC
  Administered 2011-09-28: 1 mg via INTRAVENOUS
  Filled 2011-09-28: qty 1

## 2011-09-28 MED ORDER — HYDROMORPHONE HCL PF 1 MG/ML IJ SOLN
0.5000 mg | Freq: Once | INTRAMUSCULAR | Status: AC
  Administered 2011-09-28: 0.5 mg via INTRAVENOUS
  Filled 2011-09-28: qty 1

## 2011-09-28 MED ORDER — ONDANSETRON HCL 4 MG/2ML IJ SOLN
4.0000 mg | Freq: Once | INTRAMUSCULAR | Status: AC
  Administered 2011-09-28: 4 mg via INTRAVENOUS
  Filled 2011-09-28: qty 2

## 2011-09-28 MED ORDER — SODIUM CHLORIDE 0.9 % IV BOLUS (SEPSIS)
2000.0000 mL | Freq: Once | INTRAVENOUS | Status: AC
  Administered 2011-09-28: 2000 mL via INTRAVENOUS

## 2011-09-28 NOTE — ED Provider Notes (Signed)
History     CSN: [id number]  Arrival date & time 09/28/11  0046   First MD Initiated Contact with Patient 09/28/11 0132      Chief Complaint  Patient presents with  . Abdominal Pain    (Consider location/radiation/quality/duration/timing/severity/associated sxs/prior treatment) HPI This 56 year old female has over 12 years of chronic right upper quadrant abdominal pain with intermittent vomiting. She's abdominal pain everyday controlled on narcotics by her pain management doctor. She has occasional exacerbations of worsening pain vomiting causing emergency department visits and sometimes hospitalizations. She states she has had multiple evaluations by multiple doctors at multiple hospitals with unknown etiology of her chronic abdominal pain with waxing and waning symptoms exacerbations and vomiting. She states she has had endoscopies other imaging multiple lab tests and just wants IV fluids Zofran IV and IV Dilaudid tonight because that is the only thing that helps her when she gets worse like this. She states she has an exacerbation of her typical right upper quadrant pain all day long with multiple episodes of vomiting and became dehydrated today. She states her Zofran at home and Percocet home was not helping her today. She has no fever cough chest pain shortness of breath hallucinating confusion rashes lower abdominal pain or other concerns. This is just like her multiple prior exacerbations of right upper quadrant abdominal pain since her cholecystectomy over 12 years ago. Her pain is severe nonradiating constant for over 12 hours and associated with nonbloody vomiting. Past Medical History  Diagnosis Date  . Hypertension    chronic right upper quadrant abdominal pain with vomiting  Past Surgical History  Procedure Date  . Abdominal hysterectomy   . Cholecystectomy     No family history on file.  History  Substance Use Topics  . Smoking status: Current Everyday Smoker -- 0.5  packs/day    Types: Cigarettes  . Smokeless tobacco: Never Used  . Alcohol Use: Yes     socially    OB History    Grav Para Term Preterm Abortions TAB SAB Ect Mult Living                  Review of Systems  Constitutional: Negative for fever.       10 Systems reviewed and are negative for acute change except as noted in the HPI.  HENT: Negative for congestion.   Eyes: Negative for discharge and redness.  Respiratory: Negative for cough and shortness of breath.   Cardiovascular: Negative for chest pain.  Gastrointestinal: Positive for nausea, vomiting and abdominal pain. Negative for diarrhea and blood in stool.  Genitourinary: Negative for dysuria.  Musculoskeletal: Negative for back pain.  Skin: Negative for rash.  Neurological: Negative for syncope, numbness and headaches.  Psychiatric/Behavioral:       No behavior change.    Allergies  Avelox; Diuretic; Ibuprofen; Morphine and related; and Phenergan  Home Medications   Current Outpatient Rx  Name Route Sig Dispense Refill  . AMLODIPINE BESYLATE 10 MG PO TABS Oral Take 10 mg by mouth daily.      Marland Kitchen DILAUDID PO Oral Take by mouth.      Marland Kitchen LISINOPRIL 20 MG PO TABS Oral Take 20 mg by mouth daily.      Marland Kitchen ONDANSETRON HCL 4 MG PO TABS Oral Take 1 tablet (4 mg total) by mouth every 8 (eight) hours as needed. nausea 20 tablet 0  . OXYCODONE-ACETAMINOPHEN 5-325 MG PO TABS Oral Take 1 tablet by mouth every 4 (four) hours as needed.  pain    . POTASSIUM CHLORIDE CRYS ER 20 MEQ PO TBCR Oral Take 20 mEq by mouth 2 (two) times daily.      Marland Kitchen ZOLPIDEM TARTRATE 10 MG PO TABS Oral Take 10 mg by mouth at bedtime as needed. sleep       BP 148/90  Pulse 109  Temp(Src) 99.8 F (37.7 C) (Oral)  Resp 16  SpO2 100%  Physical Exam  Nursing note and vitals reviewed. Constitutional:       Awake, alert, nontoxic appearance.  HENT:  Head: Atraumatic.  Eyes: Right eye exhibits no discharge. Left eye exhibits no discharge.  Neck: Neck  supple.  Cardiovascular: Normal rate and regular rhythm.   No murmur heard. Pulmonary/Chest: Effort normal. No respiratory distress. She has no wheezes. She has no rales. She exhibits no tenderness.  Abdominal: Soft. Bowel sounds are normal. She exhibits no mass. There is tenderness. There is no rebound and no guarding.       Mild right upper quadrant tenderness with rest of the abdomen nontender  Musculoskeletal: She exhibits no edema and no tenderness.       Baseline ROM, no obvious new focal weakness.  Neurological: She is alert.       Mental status and motor strength appears baseline for patient and situation.  Skin: No rash noted.  Psychiatric: She has a normal mood and affect.    ED Course  Procedures (including critical care time) Pt feels improved after observation and/or treatment in ED.Patient informed of clinical course, understand medical decision-making process, and agree with plan. Labs Reviewed - No data to display No results found.   1. Abdominal pain   2. Vomiting   3. Chronic abdominal pain   4. Chronic vomiting       MDM  I doubt any other EMC precluding discharge at this time including, but not necessarily limited to the following:peritonitis.        Hurman Horn, MD 09/29/11 [phone]

## 2011-09-28 NOTE — ED Notes (Signed)
Patient states that she is having RUQ pain. And N/V

## 2012-01-24 ENCOUNTER — Emergency Department (HOSPITAL_BASED_OUTPATIENT_CLINIC_OR_DEPARTMENT_OTHER)
Admission: EM | Admit: 2012-01-24 | Discharge: 2012-01-24 | Disposition: A | Discharge status: Home / Self Care | Payer: Self-pay | Attending: Emergency Medicine | Admitting: Emergency Medicine

## 2012-01-24 ENCOUNTER — Encounter (HOSPITAL_BASED_OUTPATIENT_CLINIC_OR_DEPARTMENT_OTHER): Payer: Self-pay | Admitting: Emergency Medicine

## 2012-01-24 DIAGNOSIS — R112 Nausea with vomiting, unspecified: Secondary | ICD-10-CM | POA: Insufficient documentation

## 2012-01-24 DIAGNOSIS — R1011 Right upper quadrant pain: Secondary | ICD-10-CM | POA: Insufficient documentation

## 2012-01-24 DIAGNOSIS — I10 Essential (primary) hypertension: Secondary | ICD-10-CM | POA: Insufficient documentation

## 2012-01-24 DIAGNOSIS — R109 Unspecified abdominal pain: Secondary | ICD-10-CM

## 2012-01-24 LAB — DIFFERENTIAL
Basophils Absolute: 0 10*3/uL (ref 0.0–0.1)
Basophils Relative: 0 % (ref 0–1)
Eosinophils Absolute: 0 10*3/uL (ref 0.0–0.7)
Monocytes Relative: 5 % (ref 3–12)
Neutro Abs: 8.7 10*3/uL — ABNORMAL HIGH (ref 1.7–7.7)
Neutrophils Relative %: 72 % (ref 43–77)

## 2012-01-24 LAB — URINALYSIS, ROUTINE W REFLEX MICROSCOPIC
Bilirubin Urine: NEGATIVE
Glucose, UA: NEGATIVE mg/dL
Leukocytes, UA: NEGATIVE
Nitrite: NEGATIVE
Specific Gravity, Urine: 1.017 (ref 1.005–1.030)
pH: 6 (ref 5.0–8.0)

## 2012-01-24 LAB — COMPREHENSIVE METABOLIC PANEL
AST: 13 U/L (ref 0–37)
Albumin: 4.5 g/dL (ref 3.5–5.2)
Alkaline Phosphatase: 109 U/L (ref 39–117)
BUN: 15 mg/dL (ref 6–23)
Chloride: 105 mEq/L (ref 96–112)
Potassium: 3.8 mEq/L (ref 3.5–5.1)
Sodium: 144 mEq/L (ref 135–145)
Total Bilirubin: 0.2 mg/dL — ABNORMAL LOW (ref 0.3–1.2)
Total Protein: 7.7 g/dL (ref 6.0–8.3)

## 2012-01-24 LAB — CBC
MCH: 33.2 pg (ref 26.0–34.0)
MCHC: 34.4 g/dL (ref 30.0–36.0)
Platelets: 495 10*3/uL — ABNORMAL HIGH (ref 150–400)
RDW: 14.9 % (ref 11.5–15.5)

## 2012-01-24 LAB — LIPASE, BLOOD: Lipase: 16 U/L (ref 11–59)

## 2012-01-24 MED ORDER — ONDANSETRON HCL 4 MG/2ML IJ SOLN
4.0000 mg | Freq: Once | INTRAMUSCULAR | Status: AC
  Administered 2012-01-24: 4 mg via INTRAVENOUS
  Filled 2012-01-24: qty 2

## 2012-01-24 MED ORDER — SODIUM CHLORIDE 0.9 % IV BOLUS (SEPSIS)
1000.0000 mL | Freq: Once | INTRAVENOUS | Status: AC
  Administered 2012-01-24: 1000 mL via INTRAVENOUS

## 2012-01-24 MED ORDER — MORPHINE SULFATE 4 MG/ML IJ SOLN
INTRAMUSCULAR | Status: AC
  Filled 2012-01-24: qty 1

## 2012-01-24 MED ORDER — HYDROMORPHONE HCL PF 1 MG/ML IJ SOLN
1.0000 mg | Freq: Once | INTRAMUSCULAR | Status: AC
  Administered 2012-01-24: 1 mg via INTRAVENOUS
  Filled 2012-01-24: qty 1

## 2012-01-24 MED ORDER — ONDANSETRON 4 MG PO TBDP: 4.0000 mg | ORAL_TABLET | Freq: Three times a day (TID) | ORAL | Status: AC | PRN

## 2012-01-24 NOTE — ED Provider Notes (Signed)
History     CSN: [id number]  Arrival date & time 01/24/12  1301   First MD Initiated Contact with Patient 01/24/12 1340      Chief Complaint  Patient presents with  . Emesis  . Abdominal Pain    (Consider location/radiation/quality/duration/timing/severity/associated sxs/prior treatment) HPI Comments: Has had 20-25 episodes of vomiting since last PM  No diarrhea.  No fever.  Mild lightheadedness with standing.  Decreased urination.  "this is nothing new.  i have had this problem on and off since i had my gall bladder removed 13 years ago.  Has been evaluated by MD's with [facility] and at [facility].  They don't know why this happens.  Localizes pain to RUQ.  Pain is sharp and crampy".  Patient is a 56 y.o. female presenting with vomiting and abdominal pain. The history is provided by the patient. No language interpreter was used.  Emesis  This is a recurrent problem. Episode onset: last PM. The problem has not changed since onset.The emesis has an appearance of stomach contents and bilious material. There has been no fever. Associated symptoms include abdominal pain and chills. Pertinent negatives include no diarrhea and no fever.  Abdominal Pain The primary symptoms of the illness include abdominal pain, nausea and vomiting. The primary symptoms of the illness do not include fever or diarrhea.  Additional symptoms associated with the illness include chills. Symptoms associated with the illness do not include hematuria or frequency.    Past Medical History  Diagnosis Date  . Hypertension     Past Surgical History  Procedure Date  . Abdominal hysterectomy   . Cholecystectomy     History reviewed. No pertinent family history.  History  Substance Use Topics  . Smoking status: Current Everyday Smoker -- 0.5 packs/day    Types: Cigarettes  . Smokeless tobacco: Never Used  . Alcohol Use: Yes     socially    OB History    Grav Para Term Preterm Abortions TAB SAB Ect Mult  Living                  Review of Systems  Constitutional: Positive for chills and appetite change. Negative for fever.  Gastrointestinal: Positive for nausea, vomiting and abdominal pain. Negative for diarrhea.  Genitourinary: Negative for frequency, hematuria and pelvic pain.  All other systems reviewed and are negative.    Allergies  Avelox; Ibuprofen; Morphine and related; and Promethazine hcl  Home Medications   Current Outpatient Rx  Name Route Sig Dispense Refill  . AMLODIPINE BESYLATE 10 MG PO TABS Oral Take 10 mg by mouth daily.      Marland Kitchen LISINOPRIL 20 MG PO TABS Oral Take 20 mg by mouth daily.      . OXYCODONE-ACETAMINOPHEN 5-325 MG PO TABS Oral Take 1 tablet by mouth every 4 (four) hours as needed. pain    . POTASSIUM CHLORIDE CRYS ER 20 MEQ PO TBCR Oral Take 20 mEq by mouth 2 (two) times daily.      Marland Kitchen ZOLPIDEM TARTRATE 10 MG PO TABS Oral Take 10 mg by mouth at bedtime as needed. sleep     . ONDANSETRON 4 MG PO TBDP Oral Take 1 tablet (4 mg total) by mouth every 8 (eight) hours as needed for nausea. 12 tablet 0    BP 127/80  Pulse 103  Temp(Src) 98.7 F (37.1 C) (Oral)  Resp 14  Ht 5' 5.5" (1.664 m)  Wt 145 lb (65.772 kg)  BMI 23.76 kg/m2  SpO2 99%  Physical Exam  Nursing note and vitals reviewed. Constitutional: She is oriented to person, place, and time. She appears well-developed and well-nourished. No distress.  HENT:  Head: Normocephalic and atraumatic.  Eyes: EOM are normal.  Neck: Normal range of motion.  Cardiovascular: Normal rate, regular rhythm and normal heart sounds.   Pulmonary/Chest: Effort normal and breath sounds normal.  Abdominal: Soft. Bowel sounds are normal. She exhibits no distension and no mass. There is tenderness in the right upper quadrant. There is no rigidity, no rebound, no guarding, no tenderness at McBurney's point and negative Murphy's sign.    Musculoskeletal: Normal range of motion.  Neurological: She is alert and oriented  to person, place, and time.  Skin: Skin is warm and dry.  Psychiatric: She has a normal mood and affect. Judgment normal.    ED Course  Procedures (including critical care time)  Labs Reviewed  CBC - Abnormal; Notable for the following:    WBC 12.2 (*)    Platelets 495 (*)    All other components within normal limits  DIFFERENTIAL - Abnormal; Notable for the following:    Neutro Abs 8.7 (*)    All other components within normal limits  COMPREHENSIVE METABOLIC PANEL - Abnormal; Notable for the following:    Glucose, Bld 135 (*)    Total Bilirubin 0.2 (*)    All other components within normal limits  URINALYSIS, ROUTINE W REFLEX MICROSCOPIC - Abnormal; Notable for the following:    APPearance CLOUDY (*)    All other components within normal limits  LIPASE, BLOOD   No results found.   1. Nausea and vomiting   2. Abdominal pain     1620- pt is feeling much better and has urinated.  Feels ready to go home.  Reviewed labwork with her.  She will f/u with her PCP prn.  MDM  Recurrent, chronic abdominal pain with n/v.  rx-zofran ODT 4 mg, 12 F/u with PCP        Worthy Rancher, PA 01/24/12 [phone]

## 2012-01-24 NOTE — ED Notes (Signed)
N/V and right upper quadrant abdominal pain since last night.  No diarrhea.  No fever, some chills.

## 2012-01-24 NOTE — Discharge Instructions (Signed)
Nausea and Vomiting Nausea is a sick feeling that often comes before throwing up (vomiting). Vomiting is a reflex where stomach contents come out of your mouth. Vomiting can cause severe loss of body fluids (dehydration). Children and elderly adults can become dehydrated quickly, especially if they also have diarrhea. Nausea and vomiting are symptoms of a condition or disease. It is important to find the cause of your symptoms. CAUSES   Direct irritation of the stomach lining. This irritation can result from increased acid production (gastroesophageal reflux disease), infection, food poisoning, taking certain medicines (such as nonsteroidal anti-inflammatory drugs), alcohol use, or tobacco use.   Signals from the brain.These signals could be caused by a headache, heat exposure, an inner ear disturbance, increased pressure in the brain from injury, infection, a tumor, or a concussion, pain, emotional stimulus, or metabolic problems.   An obstruction in the gastrointestinal tract (bowel obstruction).   Illnesses such as diabetes, hepatitis, gallbladder problems, appendicitis, kidney problems, cancer, sepsis, atypical symptoms of a heart attack, or eating disorders.   Medical treatments such as chemotherapy and radiation.   Receiving medicine that makes you sleep (general anesthetic) during surgery.  DIAGNOSIS Your caregiver may ask for tests to be done if the problems do not improve after a few days. Tests may also be done if symptoms are severe or if the reason for the nausea and vomiting is not clear. Tests may include:  Urine tests.   Blood tests.   Stool tests.   Cultures (to look for evidence of infection).   X-rays or other imaging studies.  Test results can help your caregiver make decisions about treatment or the need for additional tests. TREATMENT You need to stay well hydrated. Drink frequently but in small amounts.You may wish to drink water, sports drinks, clear broth, or  eat frozen ice pops or gelatin dessert to help stay hydrated.When you eat, eating slowly may help prevent nausea.There are also some antinausea medicines that may help prevent nausea. HOME CARE INSTRUCTIONS   Take all medicine as directed by your caregiver.   If you do not have an appetite, do not force yourself to eat. However, you must continue to drink fluids.   If you have an appetite, eat a normal diet unless your caregiver tells you differently.   Eat a variety of complex carbohydrates (rice, wheat, potatoes, bread), lean meats, yogurt, fruits, and vegetables.   Avoid high-fat foods because they are more difficult to digest.   Drink enough water and fluids to keep your urine clear or pale yellow.   If you are dehydrated, ask your caregiver for specific rehydration instructions. Signs of dehydration may include:   Severe thirst.   Dry lips and mouth.   Dizziness.   Dark urine.   Decreasing urine frequency and amount.   Confusion.   Rapid breathing or pulse.  SEEK IMMEDIATE MEDICAL CARE IF:   You have blood or brown flecks (like coffee grounds) in your vomit.   You have black or bloody stools.   You have a severe headache or stiff neck.   You are confused.   You have severe abdominal pain.   You have chest pain or trouble breathing.   You do not urinate at least once every 8 hours.   You develop cold or clammy skin.   You continue to vomit for longer than 24 to 48 hours.   You have a fever.  MAKE SURE YOU:   Understand these instructions.   Will watch your  condition.   Will get help right away if you are not doing well or get worse.  Document Released: 08/11/2005 Document Revised: 07/31/2011 Document Reviewed: 01/08/2011 [facility] Patient Information 2012 [place], [place].   Take the zofran as directed.  Follow up with your PCP as needed.

## 2012-01-25 NOTE — ED Provider Notes (Signed)
Medical screening examination/treatment/procedure(s) were performed by non-physician practitioner and as supervising physician I was immediately available for consultation/collaboration.   Chasitie Passey B. Ronie Barnhart, MD 01/25/12 0655 

## 2012-04-09 ENCOUNTER — Emergency Department (HOSPITAL_BASED_OUTPATIENT_CLINIC_OR_DEPARTMENT_OTHER)
Admission: EM | Admit: 2012-04-09 | Discharge: 2012-04-09 | Disposition: A | Discharge status: Home / Self Care | Payer: Self-pay | Attending: Emergency Medicine | Admitting: Emergency Medicine

## 2012-04-09 ENCOUNTER — Encounter (HOSPITAL_BASED_OUTPATIENT_CLINIC_OR_DEPARTMENT_OTHER): Payer: Self-pay | Admitting: *Deleted

## 2012-04-09 DIAGNOSIS — I10 Essential (primary) hypertension: Secondary | ICD-10-CM | POA: Insufficient documentation

## 2012-04-09 DIAGNOSIS — R112 Nausea with vomiting, unspecified: Secondary | ICD-10-CM | POA: Insufficient documentation

## 2012-04-09 DIAGNOSIS — R109 Unspecified abdominal pain: Secondary | ICD-10-CM | POA: Insufficient documentation

## 2012-04-09 DIAGNOSIS — G8929 Other chronic pain: Secondary | ICD-10-CM | POA: Insufficient documentation

## 2012-04-09 DIAGNOSIS — F17200 Nicotine dependence, unspecified, uncomplicated: Secondary | ICD-10-CM | POA: Insufficient documentation

## 2012-04-09 LAB — CBC WITH DIFFERENTIAL/PLATELET
Eosinophils Absolute: 0.1 10*3/uL (ref 0.0–0.7)
Lymphocytes Relative: 16 % (ref 12–46)
Lymphs Abs: 2.6 10*3/uL (ref 0.7–4.0)
MCHC: 34 g/dL (ref 30.0–36.0)
MCV: 94.5 fL (ref 78.0–100.0)
Monocytes Relative: 6 % (ref 3–12)
Neutro Abs: 12.3 10*3/uL — ABNORMAL HIGH (ref 1.7–7.7)
Platelets: 365 10*3/uL (ref 150–400)
RDW: 13.2 % (ref 11.5–15.5)
WBC: 15.9 10*3/uL — ABNORMAL HIGH (ref 4.0–10.5)

## 2012-04-09 LAB — COMPREHENSIVE METABOLIC PANEL
Alkaline Phosphatase: 128 U/L — ABNORMAL HIGH (ref 39–117)
BUN: 15 mg/dL (ref 6–23)
Chloride: 102 mEq/L (ref 96–112)
GFR calc Af Amer: >90 mL/min (ref >90)
Glucose, Bld: 130 mg/dL — ABNORMAL HIGH (ref 70–99)
Potassium: 3.5 mEq/L (ref 3.5–5.1)
Total Bilirubin: 0.3 mg/dL (ref 0.3–1.2)
Total Protein: 8.3 g/dL (ref 6.0–8.3)

## 2012-04-09 LAB — URINALYSIS, ROUTINE W REFLEX MICROSCOPIC
Ketones, ur: 15 mg/dL — AB
Nitrite: NEGATIVE
Protein, ur: 30 mg/dL — AB
Urobilinogen, UA: 1 mg/dL (ref 0.0–1.0)

## 2012-04-09 LAB — LIPASE, BLOOD: Lipase: 19 U/L (ref 11–59)

## 2012-04-09 MED ORDER — HYDROMORPHONE HCL PF 1 MG/ML IJ SOLN
1.0000 mg | Freq: Once | INTRAMUSCULAR | Status: AC
  Administered 2012-04-09: 1 mg via INTRAVENOUS
  Filled 2012-04-09: qty 1

## 2012-04-09 MED ORDER — ONDANSETRON HCL 4 MG/2ML IJ SOLN
4.0000 mg | Freq: Once | INTRAMUSCULAR | Status: AC
  Administered 2012-04-09: 4 mg via INTRAVENOUS
  Filled 2012-04-09: qty 2

## 2012-04-09 MED ORDER — SODIUM CHLORIDE 0.9 % IV BOLUS (SEPSIS)
1000.0000 mL | Freq: Once | INTRAVENOUS | Status: AC
  Administered 2012-04-09: 1000 mL via INTRAVENOUS

## 2012-04-09 NOTE — ED Notes (Addendum)
Vomiting and right upper quad pain. States she has had this chronic pain x 13 years since having her gallbladder out. No relief with Zofran. Pt drove herself here.

## 2012-04-09 NOTE — ED Provider Notes (Signed)
History     CSN: [id number]  Arrival date & time 04/09/12  6578   First MD Initiated Contact with Patient 04/09/12 1912      Chief Complaint  Patient presents with  . Abdominal Pain    (Consider location/radiation/quality/duration/timing/severity/associated sxs/prior treatment) The history is provided by the patient and medical records.   Theresa Carey is a 56 y.o. female presents to the emergency department complaining of abdominal pain, nausea, vomiting, diarrhea.  The onset of the symptoms was  abrupt starting 10 hours ago.  The patient has associated RQU pain, chills.  The symptoms have been  persistent, gradually worsened.  nothing makes the symptoms worse and nothing makes symptoms better.  The patient denies headaches, fever, CP, SOB, lightheadedness, dizziness.  Took home Zofran and percoet without relief.  Hx of chronic abd pain x 13 years without diagnosis.  Here for symptom control.  She is actively vomiting in the room and having diarrhea  The patient has medical history significant for:  Past Medical History  Diagnosis Date  . Hypertension      Past Medical History  Diagnosis Date  . Hypertension     Past Surgical History  Procedure Date  . Abdominal hysterectomy   . Cholecystectomy     No family history on file.  History  Substance Use Topics  . Smoking status: Current Everyday Smoker -- 0.5 packs/day    Types: Cigarettes  . Smokeless tobacco: Never Used  . Alcohol Use: Yes     socially    OB History    Grav Para Term Preterm Abortions TAB SAB Ect Mult Living                  Review of Systems  Constitutional: Positive for chills. Negative for fever, diaphoresis, appetite change, fatigue and unexpected weight change.  HENT: Negative for mouth sores.   Eyes: Negative for visual disturbance.  Respiratory: Negative for cough, chest tightness, shortness of breath and wheezing.   Cardiovascular: Negative for chest pain.  Gastrointestinal: Positive  for nausea, vomiting, abdominal pain and diarrhea. Negative for constipation.  Genitourinary: Negative for dysuria, urgency, frequency and hematuria.  Musculoskeletal: Negative for back pain.  Skin: Negative for rash.  Neurological: Negative for syncope, light-headedness and headaches.  Hematological: Does not bruise/bleed easily.  Psychiatric/Behavioral: Negative for disturbed wake/sleep cycle. The patient is not nervous/anxious.     Allergies  Avelox; Ibuprofen; Morphine and related; and Promethazine hcl  Home Medications   Current Outpatient Rx  Name Route Sig Dispense Refill  . AMLODIPINE BESYLATE 10 MG PO TABS Oral Take 10 mg by mouth daily.     . ATORVASTATIN CALCIUM 10 MG PO TABS Oral Take 10 mg by mouth daily.    Marland Kitchen LISINOPRIL 20 MG PO TABS Oral Take 20 mg by mouth daily.     Marland Kitchen POTASSIUM CHLORIDE CRYS ER 20 MEQ PO TBCR Oral Take 20 mEq by mouth 2 (two) times daily.     . OXYCODONE-ACETAMINOPHEN 5-325 MG PO TABS Oral Take 1 tablet by mouth every 4 (four) hours as needed. pain    . ZOLPIDEM TARTRATE 10 MG PO TABS Oral Take 10 mg by mouth at bedtime as needed. sleep      BP 139/88  Pulse 106  Temp 99.3 F (37.4 C) (Oral)  Resp 22  SpO2 100%  Physical Exam  Nursing note and vitals reviewed. Constitutional: She appears well-developed and well-nourished.  HENT:  Head: Normocephalic and atraumatic.  Mouth/Throat: Oropharynx is clear  and moist.  Eyes: Conjunctivae are normal. No scleral icterus.  Neck: Normal range of motion.  Cardiovascular: Normal rate, regular rhythm, normal heart sounds and intact distal pulses.  Exam reveals no gallop and no friction rub.   No murmur heard. Pulmonary/Chest: Effort normal and breath sounds normal. No respiratory distress. She has no wheezes. She has no rales. She exhibits no tenderness.  Abdominal: Soft. Bowel sounds are normal. She exhibits no distension and no mass. There is tenderness in the right upper quadrant. There is guarding and  positive Murphy's sign. There is no rigidity, no rebound, no CVA tenderness and no tenderness at McBurney's point.  Musculoskeletal: She exhibits no edema.  Lymphadenopathy:    She has no cervical adenopathy.  Neurological: She is alert.  Skin: Skin is warm and dry. No rash noted.  Psychiatric: She has a normal mood and affect.    ED Course  Procedures (including critical care time)  Labs Reviewed  CBC WITH DIFFERENTIAL - Abnormal; Notable for the following:    WBC 15.9 (*)     Neutrophils Relative 78 (*)     Neutro Abs 12.3 (*)     All other components within normal limits  COMPREHENSIVE METABOLIC PANEL - Abnormal; Notable for the following:    Glucose, Bld 130 (*)     Alkaline Phosphatase 128 (*)     All other components within normal limits  URINALYSIS, ROUTINE W REFLEX MICROSCOPIC - Abnormal; Notable for the following:    Color, Urine AMBER (*)  BIOCHEMICALS MAY BE AFFECTED BY COLOR   APPearance CLOUDY (*)     Bilirubin Urine SMALL (*)     Ketones, ur 15 (*)     Protein, ur 30 (*)     Leukocytes, UA SMALL (*)     All other components within normal limits  URINE MICROSCOPIC-ADD ON - Abnormal; Notable for the following:    Squamous Epithelial / LPF FEW (*)     All other components within normal limits  LIPASE, BLOOD   No results found. Results for orders placed during the hospital encounter of 04/09/12  CBC WITH DIFFERENTIAL      Component Value Range   WBC 15.9 (*) 4.0 - 10.5 K/uL   RBC 4.51  3.87 - 5.11 MIL/uL   Hemoglobin 14.5  12.0 - 15.0 g/dL   HCT 44.0  10.2 - 72.5 %   MCV 94.5  78.0 - 100.0 fL   MCH 32.2  26.0 - 34.0 pg   MCHC 34.0  30.0 - 36.0 g/dL   RDW 36.6  44.0 - 34.7 %   Platelets 365  150 - 400 K/uL   Neutrophils Relative 78 (*) 43 - 77 %   Neutro Abs 12.3 (*) 1.7 - 7.7 K/uL   Lymphocytes Relative 16  12 - 46 %   Lymphs Abs 2.6  0.7 - 4.0 K/uL   Monocytes Relative 6  3 - 12 %   Monocytes Absolute 0.9  0.1 - 1.0 K/uL   Eosinophils Relative 0  0 - 5  %   Eosinophils Absolute 0.1  0.0 - 0.7 K/uL   Basophils Relative 0  0 - 1 %   Basophils Absolute 0.0  0.0 - 0.1 K/uL  COMPREHENSIVE METABOLIC PANEL      Component Value Range   Sodium 144  135 - 145 mEq/L   Potassium 3.5  3.5 - 5.1 mEq/L   Chloride 102  96 - 112 mEq/L   CO2 26  19 -  32 mEq/L   Glucose, Bld 130 (*) 70 - 99 mg/dL   BUN 15  6 - 23 mg/dL   Creatinine, Ser 4.78  0.50 - 1.10 mg/dL   Calcium 29.5  8.4 - 62.1 mg/dL   Total Protein 8.3  6.0 - 8.3 g/dL   Albumin 5.0  3.5 - 5.2 g/dL   AST 16  0 - 37 U/L   ALT 20  0 - 35 U/L   Alkaline Phosphatase 128 (*) 39 - 117 U/L   Total Bilirubin 0.3  0.3 - 1.2 mg/dL   GFR calc non Af Amer >90  >90 mL/min   GFR calc Af Amer >90  >90 mL/min  LIPASE, BLOOD      Component Value Range   Lipase 19  11 - 59 U/L  URINALYSIS, ROUTINE W REFLEX MICROSCOPIC      Component Value Range   Color, Urine AMBER (*) YELLOW   APPearance CLOUDY (*) CLEAR   Specific Gravity, Urine 1.030  1.005 - 1.030   pH 6.0  5.0 - 8.0   Glucose, UA NEGATIVE  NEGATIVE mg/dL   Hgb urine dipstick NEGATIVE  NEGATIVE   Bilirubin Urine SMALL (*) NEGATIVE   Ketones, ur 15 (*) NEGATIVE mg/dL   Protein, ur 30 (*) NEGATIVE mg/dL   Urobilinogen, UA 1.0  0.0 - 1.0 mg/dL   Nitrite NEGATIVE  NEGATIVE   Leukocytes, UA SMALL (*) NEGATIVE  URINE MICROSCOPIC-ADD ON      Component Value Range   Squamous Epithelial / LPF FEW (*) RARE   WBC, UA 3-6  <3 WBC/hpf   Bacteria, UA RARE  RARE   Urine-Other MUCOUS PRESENT     No results found.    1. Chronic abdominal pain   2. Nausea and vomiting       MDM  Morey Hummingbird presents with nausea vomiting diarrhea quadrant pain.  Next chronic pain patient for 13 years.  I will treat here in the ER and check basic labs. CMP unremarkable.  CBC with white blood cell count of 15.9 and left shift. Lipase within normal limits. On reevaluation patient is continuing to vomit and has pain will redose medications. After second dose of  medications she is feeling better and ready to go home.  I have also discussed reasons to return immediately to the ER.  Patient states understanding.  She will need to follow-up with GI and pain management for further treatment.    1. Medications: usual home medications 2. Treatment: rest, adequate oral hydration, take medications as prescribed 3. Follow Up: with PCP, GI or pain management          Dierdre Forth, PA-C 04/09/12 2124

## 2012-04-10 ENCOUNTER — Emergency Department (HOSPITAL_BASED_OUTPATIENT_CLINIC_OR_DEPARTMENT_OTHER)
Admission: EM | Admit: 2012-04-10 | Discharge: 2012-04-10 | Disposition: A | Discharge status: Home / Self Care | Payer: Self-pay | Attending: Emergency Medicine | Admitting: Emergency Medicine

## 2012-04-10 ENCOUNTER — Encounter (HOSPITAL_BASED_OUTPATIENT_CLINIC_OR_DEPARTMENT_OTHER): Payer: Self-pay | Admitting: *Deleted

## 2012-04-10 DIAGNOSIS — R1110 Vomiting, unspecified: Secondary | ICD-10-CM | POA: Insufficient documentation

## 2012-04-10 DIAGNOSIS — G8929 Other chronic pain: Secondary | ICD-10-CM | POA: Insufficient documentation

## 2012-04-10 DIAGNOSIS — Z90710 Acquired absence of both cervix and uterus: Secondary | ICD-10-CM | POA: Insufficient documentation

## 2012-04-10 DIAGNOSIS — R1011 Right upper quadrant pain: Secondary | ICD-10-CM | POA: Insufficient documentation

## 2012-04-10 DIAGNOSIS — R112 Nausea with vomiting, unspecified: Secondary | ICD-10-CM

## 2012-04-10 DIAGNOSIS — I10 Essential (primary) hypertension: Secondary | ICD-10-CM | POA: Insufficient documentation

## 2012-04-10 DIAGNOSIS — Z9089 Acquired absence of other organs: Secondary | ICD-10-CM | POA: Insufficient documentation

## 2012-04-10 DIAGNOSIS — F17200 Nicotine dependence, unspecified, uncomplicated: Secondary | ICD-10-CM | POA: Insufficient documentation

## 2012-04-10 MED ORDER — HYDROMORPHONE HCL PF 1 MG/ML IJ SOLN
0.5000 mg | Freq: Once | INTRAMUSCULAR | Status: AC
  Administered 2012-04-10: 0.5 mg via INTRAVENOUS
  Filled 2012-04-10: qty 1

## 2012-04-10 MED ORDER — HYDROMORPHONE HCL PF 1 MG/ML IJ SOLN
1.0000 mg | Freq: Once | INTRAMUSCULAR | Status: AC
  Administered 2012-04-10: 1 mg via INTRAVENOUS
  Filled 2012-04-10: qty 1

## 2012-04-10 MED ORDER — LORAZEPAM 2 MG/ML IJ SOLN
1.0000 mg | Freq: Once | INTRAMUSCULAR | Status: AC
  Administered 2012-04-10: 1 mg via INTRAVENOUS
  Filled 2012-04-10: qty 1

## 2012-04-10 MED ORDER — ONDANSETRON HCL 4 MG/2ML IJ SOLN
INTRAMUSCULAR | Status: AC
  Administered 2012-04-10: 4 mg via INTRAVENOUS
  Filled 2012-04-10: qty 2

## 2012-04-10 MED ORDER — ONDANSETRON HCL 4 MG/2ML IJ SOLN
4.0000 mg | Freq: Once | INTRAMUSCULAR | Status: AC
  Administered 2012-04-10: 4 mg via INTRAVENOUS

## 2012-04-10 MED ORDER — SODIUM CHLORIDE 0.9 % IV BOLUS (SEPSIS): 1000.0000 mL | Freq: Once | INTRAVENOUS | Status: DC

## 2012-04-10 MED ORDER — ONDANSETRON HCL 4 MG/2ML IJ SOLN
4.0000 mg | Freq: Once | INTRAMUSCULAR | Status: AC
  Administered 2012-04-10: 4 mg via INTRAVENOUS
  Filled 2012-04-10: qty 2

## 2012-04-10 NOTE — ED Notes (Signed)
IV x 2 attempted w/o success.

## 2012-04-10 NOTE — ED Notes (Signed)
Patient reports that she had no nausea with the ginger ale. PA aware-preparing for discharge

## 2012-04-10 NOTE — ED Provider Notes (Signed)
Medical screening examination/treatment/procedure(s) were performed by non-physician practitioner and as supervising physician I was immediately available for consultation/collaboration.  Geoffery Lyons, MD 04/10/12 [phone]

## 2012-04-10 NOTE — ED Notes (Signed)
Pt seen here last p.m. For same. C/O N/V

## 2012-04-10 NOTE — ED Provider Notes (Signed)
History     CSN: [id number]  Arrival date & time 04/10/12  1658   First MD Initiated Contact with Patient 04/10/12 1709      Chief Complaint  Patient presents with  . Emesis    (Consider location/radiation/quality/duration/timing/severity/associated sxs/prior treatment) HPI Comments: This 56 year old female has over 12 years of chronic right upper quadrant abdominal pain with intermittent vomiting. She has abdominal pain everyday controlled on narcotics by her pain management doctor. She has occasional exacerbations of worsening pain vomiting causing emergency department visits and sometimes hospitalizations. She was last seen in the Emergency Department for the same last evening.  At that time she was given IV Dilaudid, IVF, and Zofran and discharged home after symptoms improved.  She states she has had multiple evaluations by multiple doctors at multiple hospitals with unknown etiology of her chronic abdominal pain with waxing and waning symptom exacerbations and vomiting. She states she has had endoscopies other imaging multiple lab tests and just wants IV fluids Zofran IV and IV Dilaudid tonight because that is the only thing that helps her when she gets worse like this. She states she has an exacerbation of her typical right upper quadrant pain since yesterday with multiple episodes of vomiting yesterday and today and became dehydrated today. She states her Zofran at home and Percocet home was not helping her today. She has no fever, cough, chest pain, shortness of breath, hallucinating, confusion, rashes, lower abdominal pain or other concerns. This is just like her multiple prior exacerbations of right upper quadrant abdominal pain since her cholecystectomy over 12 years ago. Her pain is severe nonradiating constant over the past 2 days and associated with nonbloody vomiting.   Patient is a 56 y.o. female presenting with vomiting. The history is provided by the patient.  Emesis  Associated  symptoms include abdominal pain and diarrhea. Pertinent negatives include no chills and no fever.    Past Medical History  Diagnosis Date  . Hypertension     Past Surgical History  Procedure Date  . Abdominal hysterectomy   . Cholecystectomy     History reviewed. No pertinent family history.  History  Substance Use Topics  . Smoking status: Current Everyday Smoker -- 0.5 packs/day    Types: Cigarettes  . Smokeless tobacco: Never Used  . Alcohol Use: Yes     socially    OB History    Grav Para Term Preterm Abortions TAB SAB Ect Mult Living                  Review of Systems  Constitutional: Negative for fever and chills.  Respiratory: Negative for shortness of breath.   Cardiovascular: Negative for chest pain.  Gastrointestinal: Positive for nausea, vomiting, abdominal pain and diarrhea. Negative for constipation and blood in stool.  Genitourinary: Negative for dysuria, frequency, flank pain, decreased urine volume, vaginal discharge, difficulty urinating and pelvic pain.  Neurological: Negative for dizziness, syncope and light-headedness.    Allergies  Avelox; Ibuprofen; Morphine and related; and Promethazine hcl  Home Medications   Current Outpatient Rx  Name Route Sig Dispense Refill  . AMLODIPINE BESYLATE 10 MG PO TABS Oral Take 10 mg by mouth daily.     . ATORVASTATIN CALCIUM 10 MG PO TABS Oral Take 10 mg by mouth daily.    Marland Kitchen LISINOPRIL 20 MG PO TABS Oral Take 20 mg by mouth daily.     . OXYCODONE-ACETAMINOPHEN 5-325 MG PO TABS Oral Take 1 tablet by mouth every 4 (four) hours  as needed. pain    . POTASSIUM CHLORIDE CRYS ER 20 MEQ PO TBCR Oral Take 20 mEq by mouth 2 (two) times daily.     Marland Kitchen ZOLPIDEM TARTRATE 10 MG PO TABS Oral Take 10 mg by mouth at bedtime as needed. sleep      BP 163/91  Pulse 123  Temp 99.3 F (37.4 C) (Oral)  Resp 20  Ht 5' 5.5" (1.664 m)  Wt 145 lb (65.772 kg)  BMI 23.76 kg/m2  SpO2 100%  Physical Exam  Nursing note and vitals  reviewed. Constitutional: She appears well-developed and well-nourished. No distress.  HENT:  Head: Normocephalic and atraumatic.  Mouth/Throat: Oropharynx is clear and moist.  Neck: Normal range of motion. Neck supple.  Cardiovascular: Normal rate, regular rhythm and normal heart sounds.   Pulmonary/Chest: Effort normal and breath sounds normal. No respiratory distress. She has no wheezes.  Abdominal: Soft. Normal appearance and bowel sounds are normal. She exhibits no distension and no mass. There is tenderness in the right upper quadrant. There is no rigidity, no rebound, no guarding and no tenderness at McBurney's point.  Musculoskeletal: Normal range of motion.  Neurological: She is alert.  Skin: Skin is warm and dry. She is not diaphoretic.  Psychiatric: She has a normal mood and affect.    ED Course  Procedures (including critical care time)  Labs Reviewed - No data to display No results found.   No diagnosis found.  6:52 PM Reassessed patient.  She reports that she continues to have significant RUQ abdominal pain.  The Dilaudid helped slightly.  She also continues to have nausea and does not feel that the Zofran has helped.  8:01 PM Reassessed patient.  She reports that her pain and nausea have significantly improved.  Will fluid challenge and reassess.  8:30 PM Patient able to tolerate PO liquids.  Will discharge patient home.  Patient in agreement with the pain.  MDM  Patient with chronic abdominal pain, nausea, and vomiting.  Patient was seen in the ED last evening for the same.  At that time she had lab work done, which was unremarkable.  Symptoms today are no different than symptoms that she has had the past.  Symptoms improved after several rounds of antiemetics and two doses of Dilaudid.  Patient able to tolerate po liquids prior to discharge.  Patient instructed to follow up with PCP regarding exacerbation.  Return precautions discussed with patient.           Pascal Lux [place], PA-C 04/10/12 2207

## 2012-04-11 ENCOUNTER — Emergency Department (HOSPITAL_BASED_OUTPATIENT_CLINIC_OR_DEPARTMENT_OTHER)
Admission: EM | Admit: 2012-04-11 | Discharge: 2012-04-11 | Disposition: A | Discharge status: Home / Self Care | Payer: Self-pay | Attending: Emergency Medicine | Admitting: Emergency Medicine

## 2012-04-11 ENCOUNTER — Encounter (HOSPITAL_BASED_OUTPATIENT_CLINIC_OR_DEPARTMENT_OTHER): Payer: Self-pay | Admitting: *Deleted

## 2012-04-11 ENCOUNTER — Emergency Department (HOSPITAL_BASED_OUTPATIENT_CLINIC_OR_DEPARTMENT_OTHER): Payer: Self-pay

## 2012-04-11 DIAGNOSIS — G8929 Other chronic pain: Secondary | ICD-10-CM | POA: Insufficient documentation

## 2012-04-11 DIAGNOSIS — F17200 Nicotine dependence, unspecified, uncomplicated: Secondary | ICD-10-CM | POA: Insufficient documentation

## 2012-04-11 DIAGNOSIS — R109 Unspecified abdominal pain: Secondary | ICD-10-CM | POA: Insufficient documentation

## 2012-04-11 DIAGNOSIS — I10 Essential (primary) hypertension: Secondary | ICD-10-CM | POA: Insufficient documentation

## 2012-04-11 MED ORDER — IOHEXOL 300 MG/ML  SOLN
20.0000 mL | INTRAMUSCULAR | Status: DC
  Administered 2012-04-11: 20 mL via ORAL

## 2012-04-11 MED ORDER — ONDANSETRON HCL 4 MG/2ML IJ SOLN
4.0000 mg | Freq: Once | INTRAMUSCULAR | Status: AC
  Administered 2012-04-11: 4 mg via INTRAVENOUS
  Filled 2012-04-11: qty 2

## 2012-04-11 MED ORDER — ONDANSETRON 8 MG PO TBDP: 8.0000 mg | ORAL_TABLET | Freq: Once | ORAL | Status: DC

## 2012-04-11 MED ORDER — ONDANSETRON 8 MG PO TBDP
ORAL_TABLET | ORAL | Status: AC
  Filled 2012-04-11: qty 1

## 2012-04-11 MED ORDER — HYDROMORPHONE HCL PF 1 MG/ML IJ SOLN
1.0000 mg | Freq: Once | INTRAMUSCULAR | Status: AC
  Administered 2012-04-11: 1 mg via INTRAVENOUS
  Filled 2012-04-11: qty 1

## 2012-04-11 MED ORDER — SODIUM CHLORIDE 0.9 % IV BOLUS (SEPSIS)
1000.0000 mL | Freq: Once | INTRAVENOUS | Status: AC
  Administered 2012-04-11: 1000 mL via INTRAVENOUS

## 2012-04-11 NOTE — ED Provider Notes (Signed)
History     CSN: [id number]  Arrival date & time 04/11/12  0501   First MD Initiated Contact with Patient 04/11/12 0703      Chief Complaint  Patient presents with  . Abdominal Pain    (Consider location/radiation/quality/duration/timing/severity/associated sxs/prior treatment) HPI Comments: PT has a hx of chronic, ongoing abdominal pain, located to RUQ.  She has been evaluated extensively for this.  She has seen GI specialists at 2 [facility] settings, including [facility].  Had recent CT scan last month.  Comes in today complaining of same type of pain.  Says that it is her typical, throbbing pain.  +n/v.  No fevers.  No diarrhea.  Takes percocet and zofran at home and says that at times it doesn't work and she has to come in for IV meds.  She says that this is her typical pain and does not feel that she needs a CT scan or other work up today.  This is her 3rd visit in 2 days.  Had labs 2 days ago.  Patient is a 56 y.o. female presenting with abdominal pain. The history is provided by the patient.  Abdominal Pain The primary symptoms of the illness include abdominal pain, nausea and vomiting. The primary symptoms of the illness do not include fever, fatigue, shortness of breath or diarrhea.  Symptoms associated with the illness do not include chills, diaphoresis, hematuria, frequency or back pain.    Past Medical History  Diagnosis Date  . Hypertension     Past Surgical History  Procedure Date  . Abdominal hysterectomy   . Cholecystectomy     History reviewed. No pertinent family history.  History  Substance Use Topics  . Smoking status: Current Everyday Smoker -- 0.5 packs/day    Types: Cigarettes  . Smokeless tobacco: Never Used  . Alcohol Use: Yes     socially    OB History    Grav Para Term Preterm Abortions TAB SAB Ect Mult Living                  Review of Systems  Constitutional: Negative for fever, chills, diaphoresis and fatigue.  HENT: Negative for  congestion, rhinorrhea and sneezing.   Eyes: Negative.   Respiratory: Negative for cough, chest tightness and shortness of breath.   Cardiovascular: Negative for chest pain and leg swelling.  Gastrointestinal: Positive for nausea, vomiting and abdominal pain. Negative for diarrhea and blood in stool.  Genitourinary: Negative for frequency, hematuria, flank pain and difficulty urinating.  Musculoskeletal: Negative for back pain and arthralgias.  Skin: Negative for rash.  Neurological: Negative for dizziness, speech difficulty, weakness, numbness and headaches.    Allergies  Avelox; Ibuprofen; Morphine and related; and Promethazine hcl  Home Medications   Current Outpatient Rx  Name Route Sig Dispense Refill  . AMLODIPINE BESYLATE 10 MG PO TABS Oral Take 10 mg by mouth daily.     . ATORVASTATIN CALCIUM 10 MG PO TABS Oral Take 10 mg by mouth daily.    Marland Kitchen LISINOPRIL 20 MG PO TABS Oral Take 20 mg by mouth daily.     . OXYCODONE-ACETAMINOPHEN 5-325 MG PO TABS Oral Take 1 tablet by mouth every 4 (four) hours as needed. pain    . POTASSIUM CHLORIDE CRYS ER 20 MEQ PO TBCR Oral Take 20 mEq by mouth 2 (two) times daily.     Marland Kitchen ZOLPIDEM TARTRATE 10 MG PO TABS Oral Take 10 mg by mouth at bedtime as needed. sleep  BP 162/84  Pulse 77  Temp 99.1 F (37.3 C) (Oral)  Resp 16  SpO2 98%  Physical Exam  Constitutional: She is oriented to person, place, and time. She appears well-developed and well-nourished.  HENT:  Head: Normocephalic and atraumatic.  Eyes: Pupils are equal, round, and reactive to light.  Neck: Normal range of motion. Neck supple.  Cardiovascular: Normal rate, regular rhythm and normal heart sounds.   Pulmonary/Chest: Effort normal and breath sounds normal. No respiratory distress. She has no wheezes. She has no rales. She exhibits no tenderness.  Abdominal: Soft. Bowel sounds are normal. There is tenderness (mild TTP upper abdomen). There is no rebound and no guarding.    Musculoskeletal: Normal range of motion. She exhibits no edema.  Lymphadenopathy:    She has no cervical adenopathy.  Neurological: She is alert and oriented to person, place, and time.  Skin: Skin is warm and dry. No rash noted.  Psychiatric: She has a normal mood and affect.    ED Course  Procedures (including critical care time)  Labs Reviewed - No data to display No results found.   1. Chronic abdominal pain       MDM  Pt feeling much better after one dose of zofran, dilaudid, IVFs.  No vomiting.  No fevers.  Did not feel that further labs or imaging indicated.  Will f/u with her PMD or return here for worsening symptoms.        Rolan Bucco, MD 04/11/12 [phone]

## 2012-04-11 NOTE — ED Provider Notes (Signed)
Medical screening examination/treatment/procedure(s) were performed by non-physician practitioner and as supervising physician I was immediately available for consultation/collaboration.   Hurman Horn, MD 04/11/12 [phone]

## 2012-04-11 NOTE — ED Notes (Signed)
Patient states that she is still having RUQ pain and N/V. Has been seen the last two days for the same.

## 2012-04-11 NOTE — ED Notes (Addendum)
Pt refused the ODT zofran that was ordered for her, states that it doesn't work for her, states that she has been dealing with this for 13 years

## 2012-04-11 NOTE — ED Notes (Signed)
Prior to administering Dilaudid, pt was asked if she had a ride & replied "yes"; upon discharge, pt was observed driving herself from parking lot - security was notified.  Police were not notified d/t unable to obtain license plate.

## 2012-04-12 ENCOUNTER — Emergency Department (HOSPITAL_BASED_OUTPATIENT_CLINIC_OR_DEPARTMENT_OTHER)
Admission: EM | Admit: 2012-04-12 | Discharge: 2012-04-12 | Disposition: A | Discharge status: Home / Self Care | Payer: Self-pay | Attending: Emergency Medicine | Admitting: Emergency Medicine

## 2012-04-12 ENCOUNTER — Encounter (HOSPITAL_BASED_OUTPATIENT_CLINIC_OR_DEPARTMENT_OTHER): Payer: Self-pay | Admitting: *Deleted

## 2012-04-12 DIAGNOSIS — F17200 Nicotine dependence, unspecified, uncomplicated: Secondary | ICD-10-CM | POA: Insufficient documentation

## 2012-04-12 DIAGNOSIS — I10 Essential (primary) hypertension: Secondary | ICD-10-CM | POA: Insufficient documentation

## 2012-04-12 DIAGNOSIS — G8929 Other chronic pain: Secondary | ICD-10-CM

## 2012-04-12 DIAGNOSIS — R1110 Vomiting, unspecified: Secondary | ICD-10-CM | POA: Insufficient documentation

## 2012-04-12 DIAGNOSIS — R109 Unspecified abdominal pain: Secondary | ICD-10-CM

## 2012-04-12 HISTORY — DX: Unspecified abdominal pain: R10.9

## 2012-04-12 LAB — URINALYSIS, ROUTINE W REFLEX MICROSCOPIC
Bilirubin Urine: NEGATIVE
Ketones, ur: NEGATIVE mg/dL
Nitrite: NEGATIVE
Protein, ur: NEGATIVE mg/dL
Specific Gravity, Urine: 1.022 (ref 1.005–1.030)
Urobilinogen, UA: 0.2 mg/dL (ref 0.0–1.0)

## 2012-04-12 LAB — CBC WITH DIFFERENTIAL/PLATELET
Eosinophils Absolute: 0.1 10*3/uL (ref 0.0–0.7)
Eosinophils Relative: 1 % (ref 0–5)
Hemoglobin: 14 g/dL (ref 12.0–15.0)
Lymphs Abs: 3.2 10*3/uL (ref 0.7–4.0)
MCH: 32.3 pg (ref 26.0–34.0)
MCV: 93.5 fL (ref 78.0–100.0)
Monocytes Relative: 8 % (ref 3–12)
Neutrophils Relative %: 63 % (ref 43–77)
RBC: 4.34 MIL/uL (ref 3.87–5.11)

## 2012-04-12 LAB — COMPREHENSIVE METABOLIC PANEL
Alkaline Phosphatase: 114 U/L (ref 39–117)
BUN: 11 mg/dL (ref 6–23)
CO2: 27 mEq/L (ref 19–32)
GFR calc Af Amer: >90 mL/min (ref >90)
GFR calc non Af Amer: >90 mL/min (ref >90)
Glucose, Bld: 109 mg/dL — ABNORMAL HIGH (ref 70–99)
Potassium: 3.5 mEq/L (ref 3.5–5.1)
Total Protein: 7.5 g/dL (ref 6.0–8.3)

## 2012-04-12 LAB — URINE MICROSCOPIC-ADD ON

## 2012-04-12 LAB — LIPASE, BLOOD: Lipase: 15 U/L (ref 11–59)

## 2012-04-12 MED ORDER — METOCLOPRAMIDE HCL 5 MG/ML IJ SOLN
10.0000 mg | Freq: Once | INTRAMUSCULAR | Status: AC
  Administered 2012-04-12: 10 mg via INTRAVENOUS
  Filled 2012-04-12: qty 2

## 2012-04-12 MED ORDER — SODIUM CHLORIDE 0.9 % IV BOLUS (SEPSIS)
1000.0000 mL | Freq: Once | INTRAVENOUS | Status: AC
  Administered 2012-04-12: 1000 mL via INTRAVENOUS

## 2012-04-12 MED ORDER — ONDANSETRON HCL 4 MG/2ML IJ SOLN
4.0000 mg | Freq: Once | INTRAMUSCULAR | Status: AC
  Administered 2012-04-12: 4 mg via INTRAVENOUS
  Filled 2012-04-12: qty 2

## 2012-04-12 MED ORDER — DIPHENHYDRAMINE HCL 50 MG/ML IJ SOLN
25.0000 mg | Freq: Once | INTRAMUSCULAR | Status: AC
  Administered 2012-04-12: 25 mg via INTRAVENOUS
  Filled 2012-04-12: qty 1

## 2012-04-12 MED ORDER — LORAZEPAM 2 MG/ML IJ SOLN
1.0000 mg | Freq: Once | INTRAMUSCULAR | Status: AC
  Administered 2012-04-12: 1 mg via INTRAVENOUS
  Filled 2012-04-12: qty 1

## 2012-04-12 MED ORDER — HYDROMORPHONE HCL PF 1 MG/ML IJ SOLN
0.5000 mg | Freq: Once | INTRAMUSCULAR | Status: AC
  Administered 2012-04-12: 0.5 mg via INTRAVENOUS
  Filled 2012-04-12: qty 1

## 2012-04-12 NOTE — ED Notes (Signed)
Pt requesting pain medication.  

## 2012-04-12 NOTE — ED Notes (Signed)
Pt spoke to [facility] and she states she will be patients designated driver at time of discharge.

## 2012-04-12 NOTE — ED Provider Notes (Signed)
History     CSN: [id number]  Arrival date & time 04/12/12  1134   First MD Initiated Contact with Patient 04/12/12 1226      Chief Complaint  Patient presents with  . Emesis    (Consider location/radiation/quality/duration/timing/severity/associated sxs/prior treatment) HPI Patient complaining of abdominal pain with history of chronic abdominal pain. Pain worse now since Friday.  Patient here for fourth visit in three days.  Patient without fever or chills.  States she left Sunday a.m. But she has had increased vomiting.  Patient takes oxycodone every four hours.   Past Medical History  Diagnosis Date  . Hypertension   . Abdominal pain 2000    chronic abdominal pain with nausea and vomiting    Past Surgical History  Procedure Date  . Abdominal hysterectomy   . Cholecystectomy     No family history on file.  History  Substance Use Topics  . Smoking status: Current Everyday Smoker -- 0.5 packs/day    Types: Cigarettes  . Smokeless tobacco: Never Used  . Alcohol Use: Yes     socially    OB History    Grav Para Term Preterm Abortions TAB SAB Ect Mult Living                  Review of Systems  All other systems reviewed and are negative.    Allergies  Avelox; Ibuprofen; Morphine and related; and Promethazine hcl  Home Medications   Current Outpatient Rx  Name Route Sig Dispense Refill  . AMLODIPINE BESYLATE 10 MG PO TABS Oral Take 10 mg by mouth daily.     . ATORVASTATIN CALCIUM 10 MG PO TABS Oral Take 10 mg by mouth daily.    Marland Kitchen LISINOPRIL 20 MG PO TABS Oral Take 20 mg by mouth daily.     . OXYCODONE-ACETAMINOPHEN 5-325 MG PO TABS Oral Take 1 tablet by mouth every 4 (four) hours as needed. pain    . POTASSIUM CHLORIDE CRYS ER 20 MEQ PO TBCR Oral Take 20 mEq by mouth 2 (two) times daily.     Marland Kitchen ZOLPIDEM TARTRATE 10 MG PO TABS Oral Take 10 mg by mouth at bedtime as needed. sleep      BP 140/88  Pulse 99  Temp 98.4 F (36.9 C) (Oral)  Resp 20  SpO2  100%  Physical Exam  Nursing note and vitals reviewed. Constitutional: She appears well-developed and well-nourished.  HENT:  Head: Normocephalic and atraumatic.  Eyes: Conjunctivae and EOM are normal. Pupils are equal, round, and reactive to light.  Neck: Normal range of motion. Neck supple.  Cardiovascular: Normal rate, regular rhythm, normal heart sounds and intact distal pulses.   Pulmonary/Chest: Effort normal and breath sounds normal.  Abdominal: Soft. Bowel sounds are normal.  Musculoskeletal: Normal range of motion.  Neurological: She is alert.  Skin: Skin is warm and dry.  Psychiatric: She has a normal mood and affect. Thought content normal.    ED Course  Procedures (including critical care time)  Labs Reviewed - No data to display No results found.   No diagnosis found.    MDM          Hilario Quarry, MD 04/12/12 [phone]

## 2012-04-12 NOTE — ED Notes (Signed)
Patient states she has been seen in the ed for the last 4 days.  States she developed nausea and vomiting last night and has vomited all night and morning.

## 2012-04-13 ENCOUNTER — Encounter (HOSPITAL_BASED_OUTPATIENT_CLINIC_OR_DEPARTMENT_OTHER): Payer: Self-pay | Admitting: *Deleted

## 2012-04-13 ENCOUNTER — Emergency Department (HOSPITAL_BASED_OUTPATIENT_CLINIC_OR_DEPARTMENT_OTHER)
Admission: EM | Admit: 2012-04-13 | Discharge: 2012-04-13 | Disposition: A | Discharge status: Home / Self Care | Payer: Self-pay | Attending: Emergency Medicine | Admitting: Emergency Medicine

## 2012-04-13 DIAGNOSIS — Z888 Allergy status to other drugs, medicaments and biological substances status: Secondary | ICD-10-CM | POA: Insufficient documentation

## 2012-04-13 DIAGNOSIS — I10 Essential (primary) hypertension: Secondary | ICD-10-CM | POA: Insufficient documentation

## 2012-04-13 DIAGNOSIS — R109 Unspecified abdominal pain: Secondary | ICD-10-CM | POA: Insufficient documentation

## 2012-04-13 DIAGNOSIS — F17200 Nicotine dependence, unspecified, uncomplicated: Secondary | ICD-10-CM | POA: Insufficient documentation

## 2012-04-13 DIAGNOSIS — G8929 Other chronic pain: Secondary | ICD-10-CM | POA: Insufficient documentation

## 2012-04-13 NOTE — ED Provider Notes (Signed)
History     CSN: [id number]  Arrival date & time 04/13/12  1417   None     Chief Complaint  Patient presents with  . Abdominal Pain  . Emesis    (Consider location/radiation/quality/duration/timing/severity/associated sxs/prior treatment) HPI Patient comes in stating that she has been having nausea vomiting and flareup of her chronic abdominal pain for the past 5 days. This is the fifth and the rash has come in to be evaluated. HEENT by me yesterday and had labs drawn that were normal. She received IV fluids and antibiotics. She states that she continues to vomit at home. She states that she cannot afford to go in to see her gastroenterologist and she does not have insurance. She states that she has had an extensive workup in the past including one at [facility]. She states that she has had this pain since having her gallbladder out 13 years ago. She states that she continues to vomit on a regular basis ever since having her gallbladder out. She states that she only takes her Percocet every 2 days. She has not had fever, chills, lightheadedness, or chest pain. Past Medical History  Diagnosis Date  . Hypertension   . Abdominal pain 2000    chronic abdominal pain with nausea and vomiting    Past Surgical History  Procedure Date  . Abdominal hysterectomy   . Cholecystectomy     History reviewed. No pertinent family history.  History  Substance Use Topics  . Smoking status: Current Everyday Smoker -- 0.5 packs/day    Types: Cigarettes  . Smokeless tobacco: Never Used  . Alcohol Use: Yes     socially    OB History    Grav Para Term Preterm Abortions TAB SAB Ect Mult Living                  Review of Systems  All other systems reviewed and are negative.    Allergies  Avelox; Ibuprofen; Morphine and related; and Promethazine hcl  Home Medications   Current Outpatient Rx  Name Route Sig Dispense Refill  . AMLODIPINE BESYLATE 10 MG PO TABS Oral Take 10 mg by mouth  daily.     . ATORVASTATIN CALCIUM 10 MG PO TABS Oral Take 10 mg by mouth daily.    Marland Kitchen LISINOPRIL 20 MG PO TABS Oral Take 20 mg by mouth daily.     . OXYCODONE-ACETAMINOPHEN 5-325 MG PO TABS Oral Take 1 tablet by mouth every 4 (four) hours as needed. pain    . POTASSIUM CHLORIDE CRYS ER 20 MEQ PO TBCR Oral Take 20 mEq by mouth 2 (two) times daily.     Marland Kitchen ZOLPIDEM TARTRATE 10 MG PO TABS Oral Take 10 mg by mouth at bedtime as needed. sleep      BP 135/92  Pulse 100  Temp 98 F (36.7 C) (Oral)  Resp 16  Ht 5\' 5"  (1.651 m)  Wt 145 lb (65.772 kg)  BMI 24.13 kg/m2  SpO2 98%  Physical Exam  Nursing note and vitals reviewed. Constitutional: She appears well-developed and well-nourished.  HENT:  Head: Normocephalic and atraumatic.    ED Course  Procedures (including critical care time)  Labs Reviewed - No data to display No results found.   No diagnosis found.    MDM  I discussed with patient that the emergency department cannot continue to give her narcotics on a daily basis. I also discussed that her symptoms may be secondary to narcotic withdrawal. Patient states that she  will "just leave". I discussed with patient that we would like to help create a care plan for her and contact her primary care physician and her gastroenterologist. Patient is getting dressed prior to physical exam and states that she wants to leave now.       Hilario Quarry, MD 04/13/12 1440

## 2012-04-13 NOTE — ED Notes (Signed)
Pt c/o abd pain n/v x 5 days, seen here the last 5 days  For same

## 2012-04-13 NOTE — ED Notes (Signed)
Ambulatory to tx room. Does not look like she is in discomfort. No active vomiting.

## 2012-04-13 NOTE — ED Notes (Signed)
Spoke with patient regarding development of care plan.  Patient stated that she has primary care coverage and has been trying to determine the cause of her recurrent problem for 13 years.  Patient left without receiving discharge paperwork.

## 2012-06-16 ENCOUNTER — Encounter (HOSPITAL_BASED_OUTPATIENT_CLINIC_OR_DEPARTMENT_OTHER): Payer: Self-pay | Admitting: Emergency Medicine

## 2012-06-16 ENCOUNTER — Emergency Department (HOSPITAL_BASED_OUTPATIENT_CLINIC_OR_DEPARTMENT_OTHER): Payer: Self-pay

## 2012-06-16 ENCOUNTER — Emergency Department (HOSPITAL_BASED_OUTPATIENT_CLINIC_OR_DEPARTMENT_OTHER)
Admission: EM | Admit: 2012-06-16 | Discharge: 2012-06-16 | Disposition: A | Discharge status: Home / Self Care | Payer: Self-pay | Attending: Emergency Medicine | Admitting: Emergency Medicine

## 2012-06-16 DIAGNOSIS — F17200 Nicotine dependence, unspecified, uncomplicated: Secondary | ICD-10-CM | POA: Insufficient documentation

## 2012-06-16 DIAGNOSIS — R112 Nausea with vomiting, unspecified: Secondary | ICD-10-CM | POA: Insufficient documentation

## 2012-06-16 DIAGNOSIS — R109 Unspecified abdominal pain: Secondary | ICD-10-CM | POA: Insufficient documentation

## 2012-06-16 DIAGNOSIS — Z9889 Other specified postprocedural states: Secondary | ICD-10-CM | POA: Insufficient documentation

## 2012-06-16 DIAGNOSIS — Z90710 Acquired absence of both cervix and uterus: Secondary | ICD-10-CM | POA: Insufficient documentation

## 2012-06-16 DIAGNOSIS — N390 Urinary tract infection, site not specified: Secondary | ICD-10-CM | POA: Insufficient documentation

## 2012-06-16 DIAGNOSIS — Z79899 Other long term (current) drug therapy: Secondary | ICD-10-CM | POA: Insufficient documentation

## 2012-06-16 DIAGNOSIS — I10 Essential (primary) hypertension: Secondary | ICD-10-CM | POA: Insufficient documentation

## 2012-06-16 DIAGNOSIS — G8929 Other chronic pain: Secondary | ICD-10-CM

## 2012-06-16 LAB — URINALYSIS, ROUTINE W REFLEX MICROSCOPIC
Bilirubin Urine: NEGATIVE
Hgb urine dipstick: NEGATIVE
Nitrite: NEGATIVE
Protein, ur: 30 mg/dL — AB
Specific Gravity, Urine: 1.028 (ref 1.005–1.030)
Urobilinogen, UA: 0.2 mg/dL (ref 0.0–1.0)

## 2012-06-16 LAB — CBC WITH DIFFERENTIAL/PLATELET
Eosinophils Relative: 0 % (ref 0–5)
HCT: 40.4 % (ref 36.0–46.0)
Hemoglobin: 13.8 g/dL (ref 12.0–15.0)
Lymphocytes Relative: 9 % — ABNORMAL LOW (ref 12–46)
Lymphs Abs: 1.4 10*3/uL (ref 0.7–4.0)
MCV: 92.7 fL (ref 78.0–100.0)
Monocytes Absolute: 0.5 10*3/uL (ref 0.1–1.0)
Monocytes Relative: 3 % (ref 3–12)
Neutro Abs: 14.4 10*3/uL — ABNORMAL HIGH (ref 1.7–7.7)
RBC: 4.36 MIL/uL (ref 3.87–5.11)
WBC: 16.4 10*3/uL — ABNORMAL HIGH (ref 4.0–10.5)

## 2012-06-16 LAB — COMPREHENSIVE METABOLIC PANEL
ALT: 16 U/L (ref 0–35)
CO2: 22 mEq/L (ref 19–32)
Calcium: 10.1 mg/dL (ref 8.4–10.5)
Chloride: 99 mEq/L (ref 96–112)
Creatinine, Ser: 0.7 mg/dL (ref 0.50–1.10)
GFR calc Af Amer: >90 mL/min (ref >90)
GFR calc non Af Amer: >90 mL/min (ref >90)
Glucose, Bld: 158 mg/dL — ABNORMAL HIGH (ref 70–99)
Total Bilirubin: 0.4 mg/dL (ref 0.3–1.2)

## 2012-06-16 LAB — URINE MICROSCOPIC-ADD ON

## 2012-06-16 MED ORDER — SODIUM CHLORIDE 0.9 % IV BOLUS (SEPSIS)
1000.0000 mL | Freq: Once | INTRAVENOUS | Status: AC
  Administered 2012-06-16: 1000 mL via INTRAVENOUS

## 2012-06-16 MED ORDER — POTASSIUM CHLORIDE CRYS ER 20 MEQ PO TBCR
40.0000 meq | EXTENDED_RELEASE_TABLET | Freq: Once | ORAL | Status: AC
  Administered 2012-06-16: 40 meq via ORAL
  Filled 2012-06-16: qty 2

## 2012-06-16 MED ORDER — ONDANSETRON HCL 4 MG/2ML IJ SOLN
4.0000 mg | Freq: Once | INTRAMUSCULAR | Status: AC
  Administered 2012-06-16: 4 mg via INTRAVENOUS
  Filled 2012-06-16: qty 2

## 2012-06-16 MED ORDER — SODIUM CHLORIDE 0.9 % IV BOLUS (SEPSIS): 1000.0000 mL | Freq: Once | INTRAVENOUS | Status: DC

## 2012-06-16 MED ORDER — OXYCODONE-ACETAMINOPHEN 5-325 MG PO TABS
2.0000 | ORAL_TABLET | Freq: Once | ORAL | Status: AC
  Administered 2012-06-16: 2 via ORAL
  Filled 2012-06-16 (×2): qty 2

## 2012-06-16 MED ORDER — ONDANSETRON HCL 4 MG/2ML IJ SOLN
4.0000 mg | Freq: Once | INTRAMUSCULAR | Status: AC
  Administered 2012-06-16: 4 mg via INTRAVENOUS

## 2012-06-16 MED ORDER — ONDANSETRON 8 MG PO TBDP: ORAL_TABLET | ORAL | Status: DC

## 2012-06-16 MED ORDER — ONDANSETRON HCL 4 MG/2ML IJ SOLN
INTRAMUSCULAR | Status: AC
  Administered 2012-06-16: 4 mg via INTRAVENOUS
  Filled 2012-06-16: qty 2

## 2012-06-16 MED ORDER — NITROFURANTOIN MONOHYD MACRO 100 MG PO CAPS: 100.0000 mg | ORAL_CAPSULE | Freq: Two times a day (BID) | ORAL | Status: DC

## 2012-06-16 MED ORDER — FENTANYL CITRATE 0.05 MG/ML IJ SOLN
100.0000 ug | Freq: Once | INTRAMUSCULAR | Status: AC
  Administered 2012-06-16: 100 ug via INTRAVENOUS
  Filled 2012-06-16: qty 2

## 2012-06-16 NOTE — ED Provider Notes (Signed)
History     CSN: [id number]  Arrival date & time 06/16/12  0031   First MD Initiated Contact with Patient 06/16/12 0123      Chief Complaint  Patient presents with  . Abdominal Pain  . Emesis    (Consider location/radiation/quality/duration/timing/severity/associated sxs/prior treatment) Patient is a 56 y.o. female presenting with abdominal pain. The history is provided by the patient.  Abdominal Pain The primary symptoms of the illness include abdominal pain, nausea and vomiting. The primary symptoms of the illness do not include shortness of breath or diarrhea. The current episode started more than 2 days ago. The onset of the illness was gradual. The problem has not changed since onset. The abdominal pain began more than 2 days ago. The pain came on gradually. The abdominal pain has been unchanged since its onset. The abdominal pain is located in the RUQ. The abdominal pain does not radiate. The severity of the abdominal pain is 10/10. The abdominal pain is relieved by nothing. The abdominal pain is exacerbated by vomiting.  Vomiting occurs more than 10 times per day. The emesis contains stomach contents.  Associated with: nothing. The patient has not had a change in bowel habit. Risk factors for an acute abdominal problem include a history of abdominal surgery. Symptoms associated with the illness do not include anorexia, constipation, frequency or back pain. Significant associated medical issues do not include diabetes.    Past Medical History  Diagnosis Date  . Hypertension   . Abdominal pain 2000    chronic abdominal pain with nausea and vomiting    Past Surgical History  Procedure Date  . Abdominal hysterectomy   . Cholecystectomy     No family history on file.  History  Substance Use Topics  . Smoking status: Current Every Day Smoker -- 0.5 packs/day    Types: Cigarettes  . Smokeless tobacco: Never Used  . Alcohol Use: Yes     socially    OB History    Grav  Para Term Preterm Abortions TAB SAB Ect Mult Living                  Review of Systems  Respiratory: Negative for chest tightness and shortness of breath.   Cardiovascular: Negative for chest pain.  Gastrointestinal: Positive for nausea, vomiting and abdominal pain. Negative for diarrhea, constipation and anorexia.  Genitourinary: Negative for frequency.  Musculoskeletal: Negative for back pain.  All other systems reviewed and are negative.    Allergies  Avelox; Ibuprofen; Morphine and related; and Promethazine hcl  Home Medications   Current Outpatient Rx  Name Route Sig Dispense Refill  . AMLODIPINE BESYLATE 10 MG PO TABS Oral Take 10 mg by mouth daily.     . ATORVASTATIN CALCIUM 10 MG PO TABS Oral Take 10 mg by mouth daily.    Marland Kitchen LISINOPRIL 20 MG PO TABS Oral Take 20 mg by mouth daily.     . OXYCODONE-ACETAMINOPHEN 5-325 MG PO TABS Oral Take 1 tablet by mouth every 4 (four) hours as needed. pain    . POTASSIUM CHLORIDE CRYS ER 20 MEQ PO TBCR Oral Take 20 mEq by mouth 2 (two) times daily.     Marland Kitchen ZOLPIDEM TARTRATE 10 MG PO TABS Oral Take 10 mg by mouth at bedtime as needed. sleep      BP 126/68  Pulse 114  Temp 100.3 F (37.9 C) (Oral)  Resp 16  Ht 5\' 5"  (1.651 m)  Wt 144 lb (65.318 kg)  BMI  23.96 kg/m2  SpO2 100%  Physical Exam  Constitutional: She is oriented to person, place, and time. She appears well-developed and well-nourished. No distress.  HENT:  Head: Normocephalic and atraumatic.  Mouth/Throat: Oropharynx is clear and moist.  Eyes: Conjunctivae normal are normal. Pupils are equal, round, and reactive to light.  Neck: Normal range of motion. Neck supple.  Cardiovascular: Normal rate and regular rhythm.   Pulmonary/Chest: Effort normal and breath sounds normal.  Abdominal: Soft. Bowel sounds are normal. There is no tenderness. There is no rebound and no guarding.  Musculoskeletal: Normal range of motion. She exhibits no edema.  Neurological: She is alert and  oriented to person, place, and time.  Skin: Skin is warm and dry.  Psychiatric: She has a normal mood and affect.    ED Course  Procedures (including critical care time)  Labs Reviewed  CBC WITH DIFFERENTIAL - Abnormal; Notable for the following:    WBC 16.4 (*)     Platelets 540 (*)     Neutrophils Relative 88 (*)     Neutro Abs 14.4 (*)     Lymphocytes Relative 9 (*)     All other components within normal limits  COMPREHENSIVE METABOLIC PANEL - Abnormal; Notable for the following:    Potassium 3.1 (*)     Glucose, Bld 158 (*)     Alkaline Phosphatase 139 (*)     All other components within normal limits  URINALYSIS, ROUTINE W REFLEX MICROSCOPIC - Abnormal; Notable for the following:    APPearance CLOUDY (*)     Protein, ur 30 (*)     Leukocytes, UA TRACE (*)     All other components within normal limits  URINE MICROSCOPIC-ADD ON - Abnormal; Notable for the following:    Squamous Epithelial / LPF MANY (*)     Bacteria, UA MANY (*)     All other components within normal limits  LIPASE, BLOOD   No results found.   No diagnosis found.    MDM  Pain is her typical abdominal pain.  Suspect elevated white count due to demarginization.  Will treat for UTI and provide antiemetics.  Follow up with your regular doctor for ongoing care.         Jasmine Awe, MD 06/16/12 [id number]

## 2012-06-16 NOTE — ED Notes (Signed)
MD at bedside. 

## 2012-06-16 NOTE — ED Notes (Signed)
Pt c/o RUQ pain with vomiting onset yesterday. Pt has hx of chronic abd pain with multiple visits for same.

## 2012-06-16 NOTE — ED Notes (Signed)
Pt given ginger ale. No vomiting since in ED

## 2012-06-16 NOTE — ED Notes (Signed)
Pt requesting more pain medication. MD made aware

## 2012-07-10 ENCOUNTER — Encounter (HOSPITAL_BASED_OUTPATIENT_CLINIC_OR_DEPARTMENT_OTHER): Payer: Self-pay | Admitting: *Deleted

## 2012-07-10 ENCOUNTER — Emergency Department (HOSPITAL_BASED_OUTPATIENT_CLINIC_OR_DEPARTMENT_OTHER)
Admission: EM | Admit: 2012-07-10 | Discharge: 2012-07-10 | Disposition: A | Discharge status: Home / Self Care | Payer: Self-pay | Attending: Emergency Medicine | Admitting: Emergency Medicine

## 2012-07-10 ENCOUNTER — Emergency Department (HOSPITAL_BASED_OUTPATIENT_CLINIC_OR_DEPARTMENT_OTHER): Payer: Self-pay

## 2012-07-10 DIAGNOSIS — R112 Nausea with vomiting, unspecified: Secondary | ICD-10-CM | POA: Insufficient documentation

## 2012-07-10 DIAGNOSIS — G8929 Other chronic pain: Secondary | ICD-10-CM | POA: Insufficient documentation

## 2012-07-10 DIAGNOSIS — Z79899 Other long term (current) drug therapy: Secondary | ICD-10-CM | POA: Insufficient documentation

## 2012-07-10 DIAGNOSIS — F17200 Nicotine dependence, unspecified, uncomplicated: Secondary | ICD-10-CM | POA: Insufficient documentation

## 2012-07-10 DIAGNOSIS — R1011 Right upper quadrant pain: Secondary | ICD-10-CM | POA: Insufficient documentation

## 2012-07-10 DIAGNOSIS — I10 Essential (primary) hypertension: Secondary | ICD-10-CM | POA: Insufficient documentation

## 2012-07-10 LAB — LIPASE, BLOOD: Lipase: 11 U/L (ref 11–59)

## 2012-07-10 LAB — CBC WITH DIFFERENTIAL/PLATELET
Basophils Relative: 0 % (ref 0–1)
Eosinophils Absolute: 0 10*3/uL (ref 0.0–0.7)
Eosinophils Relative: 0 % (ref 0–5)
HCT: 42.3 % (ref 36.0–46.0)
Hemoglobin: 14.3 g/dL (ref 12.0–15.0)
MCH: 31 pg (ref 26.0–34.0)
MCHC: 33.8 g/dL (ref 30.0–36.0)
MCV: 91.8 fL (ref 78.0–100.0)
Monocytes Absolute: 0.4 10*3/uL (ref 0.1–1.0)
Monocytes Relative: 2 % — ABNORMAL LOW (ref 3–12)
Neutro Abs: 17.2 10*3/uL — ABNORMAL HIGH (ref 1.7–7.7)

## 2012-07-10 LAB — COMPREHENSIVE METABOLIC PANEL
Albumin: 4.9 g/dL (ref 3.5–5.2)
BUN: 18 mg/dL (ref 6–23)
Chloride: 102 mEq/L (ref 96–112)
Creatinine, Ser: 0.6 mg/dL (ref 0.50–1.10)
Total Bilirubin: 0.4 mg/dL (ref 0.3–1.2)

## 2012-07-10 LAB — URINALYSIS, ROUTINE W REFLEX MICROSCOPIC
Bilirubin Urine: NEGATIVE
Glucose, UA: NEGATIVE mg/dL
Ketones, ur: 15 mg/dL — AB
Leukocytes, UA: NEGATIVE
Nitrite: NEGATIVE
Protein, ur: NEGATIVE mg/dL
Specific Gravity, Urine: 1.019 (ref 1.005–1.030)
Urobilinogen, UA: 0.2 mg/dL (ref 0.0–1.0)
pH: 5.5 (ref 5.0–8.0)

## 2012-07-10 LAB — URINE MICROSCOPIC-ADD ON

## 2012-07-10 MED ORDER — METOCLOPRAMIDE HCL 5 MG/ML IJ SOLN
10.0000 mg | Freq: Once | INTRAMUSCULAR | Status: AC
  Administered 2012-07-10: 10 mg via INTRAVENOUS
  Filled 2012-07-10: qty 2

## 2012-07-10 MED ORDER — PROMETHAZINE HCL 25 MG RE SUPP: 25.0000 mg | Freq: Four times a day (QID) | RECTAL | Status: DC | PRN

## 2012-07-10 MED ORDER — HYDROMORPHONE HCL PF 2 MG/ML IJ SOLN
2.0000 mg | Freq: Once | INTRAMUSCULAR | Status: AC
  Administered 2012-07-10: 2 mg via INTRAVENOUS
  Filled 2012-07-10: qty 1

## 2012-07-10 MED ORDER — PROMETHAZINE HCL 25 MG RE SUPP
25.0000 mg | Freq: Four times a day (QID) | RECTAL | Status: DC | PRN
  Filled 2012-07-10: qty 1

## 2012-07-10 MED ORDER — METOCLOPRAMIDE HCL 10 MG PO TABS: 10.0000 mg | ORAL_TABLET | Freq: Four times a day (QID) | ORAL | Status: DC

## 2012-07-10 MED ORDER — SODIUM CHLORIDE 0.9 % IV BOLUS (SEPSIS)
1000.0000 mL | Freq: Once | INTRAVENOUS | Status: AC
  Administered 2012-07-10: 1000 mL via INTRAVENOUS

## 2012-07-10 MED ORDER — SODIUM CHLORIDE 0.9 % IV SOLN
Freq: Once | INTRAVENOUS | Status: AC
  Administered 2012-07-10: 18:00:00 via INTRAVENOUS

## 2012-07-10 MED ORDER — ONDANSETRON HCL 4 MG/2ML IJ SOLN
4.0000 mg | Freq: Once | INTRAMUSCULAR | Status: AC
  Administered 2012-07-10: 4 mg via INTRAVENOUS
  Filled 2012-07-10: qty 2

## 2012-07-10 NOTE — ED Notes (Signed)
RUQ pain for 13 years with periodic flare-ups of N/V

## 2012-07-10 NOTE — ED Provider Notes (Signed)
Medical screening examination/treatment/procedure(s) were performed by non-physician practitioner and as supervising physician I was immediately available for consultation/collaboration.   Gavin Pound. Oletta Lamas, MD 07/10/12 2158

## 2012-07-10 NOTE — ED Notes (Signed)
Patient transported to CT 

## 2012-07-10 NOTE — ED Provider Notes (Signed)
History     CSN: [id number]  Arrival date & time 07/10/12  1345   First MD Initiated Contact with Patient 07/10/12 1504      Chief Complaint  Patient presents with  . Abdominal Pain    (Consider location/radiation/quality/duration/timing/severity/associated sxs/prior treatment) HPI Comments: Patient is a 56 year old female with a past medical history of chronic abdominal pain with associated nausea and vomiting who presents with RUQ abdominal pain since yesterday. The pain is located in her RUQ and does not radiate. The pain is described as cramping and severe. The pain started gradually and progressively worsened since the onset. No alleviating/aggravating factors. The patient has tried Percocet for symptoms without relief. Associated symptoms include nausea and vomiting. Patient denies fever, headache, diarrhea, chest pain, SOB, dysuria, constipation, abnormal vaginal bleeding/discharge.      Past Medical History  Diagnosis Date  . Hypertension   . Abdominal pain 2000    chronic abdominal pain with nausea and vomiting    Past Surgical History  Procedure Date  . Abdominal hysterectomy   . Cholecystectomy     History reviewed. No pertinent family history.  History  Substance Use Topics  . Smoking status: Current Every Day Smoker -- 0.5 packs/day    Types: Cigarettes  . Smokeless tobacco: Never Used  . Alcohol Use: Yes     Comment: socially    OB History    Grav Para Term Preterm Abortions TAB SAB Ect Mult Living                  Review of Systems  Gastrointestinal: Positive for nausea, vomiting and abdominal pain.  All other systems reviewed and are negative.    Allergies  Avelox; Ibuprofen; Morphine and related; and Promethazine hcl  Home Medications   Current Outpatient Rx  Name  Route  Sig  Dispense  Refill  . AMLODIPINE BESYLATE 10 MG PO TABS   Oral   Take 10 mg by mouth daily.          . ATORVASTATIN CALCIUM 10 MG PO TABS   Oral   Take 10  mg by mouth daily.         Marland Kitchen LISINOPRIL 20 MG PO TABS   Oral   Take 20 mg by mouth daily.          Marland Kitchen NITROFURANTOIN MONOHYD MACRO 100 MG PO CAPS   Oral   Take 1 capsule (100 mg total) by mouth 2 (two) times daily. X 7 days   14 capsule   0   . ONDANSETRON 8 MG PO TBDP      8mg  ODT q8 hours prn nausea   4 tablet   0   . OXYCODONE-ACETAMINOPHEN 5-325 MG PO TABS   Oral   Take 1 tablet by mouth every 4 (four) hours as needed. pain         . POTASSIUM CHLORIDE CRYS ER 20 MEQ PO TBCR   Oral   Take 20 mEq by mouth 2 (two) times daily.          Marland Kitchen ZOLPIDEM TARTRATE 10 MG PO TABS   Oral   Take 10 mg by mouth at bedtime as needed. sleep           BP 149/84  Pulse 133  Temp 98.9 F (37.2 C) (Oral)  Resp 20  Ht 5' 5.5" (1.664 m)  Wt 144 lb (65.318 kg)  BMI 23.60 kg/m2  SpO2 100%  Physical Exam  Nursing note and  vitals reviewed. Constitutional: She is oriented to person, place, and time. She appears well-developed and well-nourished. No distress.  HENT:  Head: Normocephalic and atraumatic.  Mouth/Throat: Oropharynx is clear and moist. No oropharyngeal exudate.  Eyes: Conjunctivae normal and EOM are normal. Pupils are equal, round, and reactive to light.  Neck: Normal range of motion. Neck supple.  Cardiovascular: Normal rate and regular rhythm.  Exam reveals no gallop and no friction rub.   No murmur heard. Pulmonary/Chest: Effort normal and breath sounds normal. She has no wheezes. She has no rales. She exhibits no tenderness.  Abdominal: Soft. She exhibits no distension. There is tenderness. There is no rebound and no guarding.       RUQ tenderness to palpation. No peritoneal signs.   Musculoskeletal: Normal range of motion.  Neurological: She is alert and oriented to person, place, and time. Coordination normal.       Speech is goal-oriented. Moves limbs without ataxia.   Skin: Skin is warm and dry. She is not diaphoretic.  Psychiatric: She has a normal mood  and affect. Her behavior is normal.    ED Course  Procedures (including critical care time)  Labs Reviewed  URINALYSIS, ROUTINE W REFLEX MICROSCOPIC - Abnormal; Notable for the following:    APPearance CLOUDY (*)     Hgb urine dipstick SMALL (*)     Ketones, ur 15 (*)     All other components within normal limits  URINE MICROSCOPIC-ADD ON - Abnormal; Notable for the following:    Squamous Epithelial / LPF FEW (*)     Bacteria, UA MANY (*)     Casts HYALINE CASTS (*)     All other components within normal limits  CBC WITH DIFFERENTIAL - Abnormal; Notable for the following:    WBC 19.1 (*)     Platelets 484 (*)     Neutrophils Relative 90 (*)     Neutro Abs 17.2 (*)     Lymphocytes Relative 7 (*)     Monocytes Relative 2 (*)     All other components within normal limits  COMPREHENSIVE METABOLIC PANEL - Abnormal; Notable for the following:    Glucose, Bld 146 (*)     Alkaline Phosphatase 144 (*)     All other components within normal limits  LIPASE, BLOOD  URINE CULTURE   US Abdomen Complete  07/10/2012  *RADIOLOGY REPORT*  Clinical Data:  Intermittent right upper quadrant abdominal pain, prior cholecystectomy  COMPLETE ABDOMINAL ULTRASOUND  Comparison:  CT abdomen pelvis dated 03/14/2011  Findings:  Gallbladder:  Surgically absent.  Common bile duct:  Focally dilated up to 11 mm in its midportion, unchanged from prior CT.  Liver:  No focal lesion identified.  Within normal limits in parenchymal echogenicity.  IVC:  Appears normal.  Pancreas:  Poorly visualized due to overlying bowel gas.  Spleen:  Measures a 2 cm.  Right Kidney:  Measures 4.0 cm.  No mass or hydronephrosis.  Left Kidney:  Measures 11.9 cm.  No mass or hydronephrosis.  Abdominal aorta:  No aneurysm identified.  IMPRESSION: Prior cholecystectomy.  Stable common duct.  Otherwise negative abdominal ultrasound.   Original Report Authenticated By: Charline Bills, M.D.      1. Chronic abdominal pain       MDM    3:11 PM Patient actively vomiting during interview. Patient will have fluids and zofran for nausea and vomiting. Basic labs, lipase pending. Patient will have RUQ [place] to rule out common bile duct stone.  4:59 PM Labs unremarkable. Elevated WBC which has been chronically elevated.   5:45 PM Patient's vomiting has stopped but she still complains of nausea and pain. Patient will have rectal Phenergan, reglan, and another dose of dilaudid before going home. Patient will follow up with her PCP regarding chronic pain. No further evaluation needed. Repeat abdominal exam reveals no tenderness to palpation, as patient is distracted during abdominal palpation.       Emilia Beck, PA-C 07/10/12 2152

## 2012-07-10 NOTE — ED Notes (Addendum)
Pt states "do you think the doctor might give me another round [of pain and nausea medication]?" Pt calm and cooperative, watching TV, in NAD. MD/PA aware of pt requests.

## 2012-07-10 NOTE — ED Notes (Signed)
Patient with pressured speech. Reports "many years" of intermittent prescribed narcotic use for abdominal pain and vomiting of unknown cause "I've seen specialists from everywhere, [facility], [facility], everywhere." "Usually I just come in here and get some IV Zofran and what is it called... Some medicine for pain." "Dilaudid maybe." Pt  Reports vomiting 50+ times in past 24 hours, but has not vomited since arrival. Pt given urine specimen cup and encouraged to obtain clean catch specimen.

## 2012-07-10 NOTE — ED Notes (Signed)
Pt with unwitnessed emesis x1. Pt reports continued request for Dilaudid. PA and attending MD notified.

## 2012-07-11 ENCOUNTER — Encounter (HOSPITAL_BASED_OUTPATIENT_CLINIC_OR_DEPARTMENT_OTHER): Payer: Self-pay | Admitting: *Deleted

## 2012-07-11 ENCOUNTER — Emergency Department (HOSPITAL_BASED_OUTPATIENT_CLINIC_OR_DEPARTMENT_OTHER)
Admission: EM | Admit: 2012-07-11 | Discharge: 2012-07-12 | Disposition: A | Discharge status: Home / Self Care | Payer: Self-pay | Attending: Emergency Medicine | Admitting: Emergency Medicine

## 2012-07-11 DIAGNOSIS — I10 Essential (primary) hypertension: Secondary | ICD-10-CM | POA: Insufficient documentation

## 2012-07-11 DIAGNOSIS — Z79899 Other long term (current) drug therapy: Secondary | ICD-10-CM | POA: Insufficient documentation

## 2012-07-11 DIAGNOSIS — F17200 Nicotine dependence, unspecified, uncomplicated: Secondary | ICD-10-CM | POA: Insufficient documentation

## 2012-07-11 DIAGNOSIS — R1011 Right upper quadrant pain: Secondary | ICD-10-CM | POA: Insufficient documentation

## 2012-07-11 DIAGNOSIS — R112 Nausea with vomiting, unspecified: Secondary | ICD-10-CM | POA: Insufficient documentation

## 2012-07-11 MED ORDER — FENTANYL CITRATE 0.05 MG/ML IJ SOLN
100.0000 ug | Freq: Once | INTRAMUSCULAR | Status: AC
  Administered 2012-07-11: 100 ug via INTRAVENOUS
  Filled 2012-07-11: qty 2

## 2012-07-11 MED ORDER — SODIUM CHLORIDE 0.9 % IV BOLUS (SEPSIS)
2000.0000 mL | Freq: Once | INTRAVENOUS | Status: AC
  Administered 2012-07-11 – 2012-07-12 (×2): 2000 mL via INTRAVENOUS

## 2012-07-11 MED ORDER — ONDANSETRON HCL 4 MG/2ML IJ SOLN
4.0000 mg | Freq: Once | INTRAMUSCULAR | Status: AC
  Administered 2012-07-11: 4 mg via INTRAVENOUS
  Filled 2012-07-11: qty 2

## 2012-07-11 NOTE — ED Notes (Signed)
Pt seen here yesterday for N/V. States she was given Phenergan supp but they have not helped.

## 2012-07-11 NOTE — ED Provider Notes (Signed)
History   This chart was scribed for Hanley Seamen, MD by Thad Ranger, ED Scribe. This patient was seen in room MH02/MH02 and the patient's care was started at 11:14 PM.   CSN: [id number]  Arrival date & time 07/11/12  2149   None     Chief Complaint  Patient presents with  . Emesis    The history is provided by the patient. No language interpreter was used.    Theresa Carey is a 56 y.o. female with a past medical history of chronic abdominal pain with associated nausea and vomiting who presents to the Emergency Department complaining of persistent vomiting onset yesterday. Patient reports having 35 episodes of vomiting today. She also complains of constant, non-radiating RUQ abdominal pain that is moderate in severity. Patient was seen here for same symptoms yesterday and was discharged with Phenergan suppositories. She has had no relief with this. Patient denies fever, headache, diarrhea, chest pain, SOB, dysuria, constipation, abnormal vaginal bleeding/discharge.   Past Medical History  Diagnosis Date  . Hypertension   . Abdominal pain 2000    chronic abdominal pain with nausea and vomiting    Past Surgical History  Procedure Date  . Abdominal hysterectomy   . Cholecystectomy     History reviewed. No pertinent family history.  History  Substance Use Topics  . Smoking status: Current Every Day Smoker -- 0.5 packs/day    Types: Cigarettes  . Smokeless tobacco: Never Used  . Alcohol Use: Yes     Comment: socially    No OB history provided.  Review of Systems  A complete 10 system review of systems was obtained and all systems are negative except as noted in the HPI and PMH.    Allergies  Avelox; Ibuprofen; Morphine and related; and Promethazine hcl  Home Medications   Current Outpatient Rx  Name  Route  Sig  Dispense  Refill  . AMLODIPINE BESYLATE 10 MG PO TABS   Oral   Take 10 mg by mouth daily.          . ATORVASTATIN CALCIUM 10 MG PO TABS  Oral   Take 10 mg by mouth daily.         Marland Kitchen LISINOPRIL 20 MG PO TABS   Oral   Take 20 mg by mouth daily.          Marland Kitchen METOCLOPRAMIDE HCL 10 MG PO TABS   Oral   Take 1 tablet (10 mg total) by mouth every 6 (six) hours.   30 tablet   0   . NITROFURANTOIN MONOHYD MACRO 100 MG PO CAPS   Oral   Take 1 capsule (100 mg total) by mouth 2 (two) times daily. X 7 days   14 capsule   0   . ONDANSETRON 8 MG PO TBDP      8mg  ODT q8 hours prn nausea   4 tablet   0   . OXYCODONE-ACETAMINOPHEN 5-325 MG PO TABS   Oral   Take 1 tablet by mouth every 4 (four) hours as needed. pain         . POTASSIUM CHLORIDE CRYS ER 20 MEQ PO TBCR   Oral   Take 20 mEq by mouth 2 (two) times daily.          Marland Kitchen PROMETHAZINE HCL 25 MG RE SUPP   Rectal   Place 1 suppository (25 mg total) rectally every 6 (six) hours as needed for nausea.   12 each   0   .  ZOLPIDEM TARTRATE 10 MG PO TABS   Oral   Take 10 mg by mouth at bedtime as needed. sleep           BP 143/92  Pulse 123  Temp 99 F (37.2 C) (Oral)  Resp 20  Ht 5' 5.5" (1.664 m)  Wt 144 lb (65.318 kg)  BMI 23.60 kg/m2  SpO2 97%  Physical Exam  HENT:       Dry mucus membrane.   Cardiovascular: Regular rhythm and normal heart sounds.        Tachycardic.  Pulmonary/Chest: Effort normal and breath sounds normal.  Abdominal: Soft. Bowel sounds are normal. She exhibits no distension. There is tenderness.       RUQ tenderness.    ED Course  Procedures (including critical care time)  DIAGNOSTIC STUDIES: Oxygen Saturation is 97% on room air, adequate by my interpretation.    COORDINATION OF CARE: 10:38 PM Discussed treatment plan with pt at bedside and pt agreed to plan.     MDM  1:22 AM Patient rehydrated. Emesis is resolved. Patient would like to try going home at this time.     I personally performed the services described in this documentation, which was scribed in my presence.  The recorded information has been  reviewed and accurate.    Hanley Seamen, MD 07/12/12 [phone]

## 2012-07-12 MED ORDER — FENTANYL CITRATE 0.05 MG/ML IJ SOLN
100.0000 ug | Freq: Once | INTRAMUSCULAR | Status: AC
  Administered 2012-07-12: 100 ug via INTRAVENOUS
  Filled 2012-07-12: qty 2

## 2012-07-12 MED ORDER — ONDANSETRON HCL 4 MG/2ML IJ SOLN
4.0000 mg | Freq: Once | INTRAMUSCULAR | Status: AC
  Administered 2012-07-12: 4 mg via INTRAVENOUS
  Filled 2012-07-12: qty 2

## 2012-07-13 LAB — URINE CULTURE

## 2012-07-14 NOTE — ED Notes (Addendum)
+   urine Patient treated with Macrobid-sensitive to same-chart appended per protocol MD. 

## 2012-08-14 ENCOUNTER — Emergency Department (HOSPITAL_BASED_OUTPATIENT_CLINIC_OR_DEPARTMENT_OTHER): Payer: Self-pay

## 2012-08-14 ENCOUNTER — Emergency Department (HOSPITAL_BASED_OUTPATIENT_CLINIC_OR_DEPARTMENT_OTHER)
Admission: EM | Admit: 2012-08-14 | Discharge: 2012-08-14 | Disposition: A | Discharge status: Home / Self Care | Payer: Self-pay | Attending: Emergency Medicine | Admitting: Emergency Medicine

## 2012-08-14 ENCOUNTER — Inpatient Hospital Stay (HOSPITAL_BASED_OUTPATIENT_CLINIC_OR_DEPARTMENT_OTHER)
Admission: EM | Admit: 2012-08-14 | Discharge: 2012-08-16 | DRG: 392 | Disposition: A | Discharge status: Home / Self Care | Payer: MEDICAID | Attending: Internal Medicine | Admitting: Internal Medicine

## 2012-08-14 ENCOUNTER — Encounter (HOSPITAL_BASED_OUTPATIENT_CLINIC_OR_DEPARTMENT_OTHER): Payer: Self-pay | Admitting: *Deleted

## 2012-08-14 ENCOUNTER — Encounter (HOSPITAL_BASED_OUTPATIENT_CLINIC_OR_DEPARTMENT_OTHER): Payer: Self-pay | Admitting: Emergency Medicine

## 2012-08-14 DIAGNOSIS — R112 Nausea with vomiting, unspecified: Principal | ICD-10-CM | POA: Diagnosis present

## 2012-08-14 DIAGNOSIS — R1011 Right upper quadrant pain: Secondary | ICD-10-CM

## 2012-08-14 DIAGNOSIS — E876 Hypokalemia: Secondary | ICD-10-CM

## 2012-08-14 DIAGNOSIS — I10 Essential (primary) hypertension: Secondary | ICD-10-CM | POA: Diagnosis present

## 2012-08-14 DIAGNOSIS — R1115 Cyclical vomiting syndrome unrelated to migraine: Secondary | ICD-10-CM

## 2012-08-14 DIAGNOSIS — E860 Dehydration: Secondary | ICD-10-CM

## 2012-08-14 DIAGNOSIS — Z720 Tobacco use: Secondary | ICD-10-CM | POA: Diagnosis present

## 2012-08-14 DIAGNOSIS — F17200 Nicotine dependence, unspecified, uncomplicated: Secondary | ICD-10-CM

## 2012-08-14 DIAGNOSIS — Z79899 Other long term (current) drug therapy: Secondary | ICD-10-CM | POA: Insufficient documentation

## 2012-08-14 DIAGNOSIS — Z23 Encounter for immunization: Secondary | ICD-10-CM

## 2012-08-14 DIAGNOSIS — R1110 Vomiting, unspecified: Secondary | ICD-10-CM

## 2012-08-14 DIAGNOSIS — G8929 Other chronic pain: Secondary | ICD-10-CM | POA: Diagnosis present

## 2012-08-14 DIAGNOSIS — D72829 Elevated white blood cell count, unspecified: Secondary | ICD-10-CM | POA: Diagnosis present

## 2012-08-14 LAB — COMPREHENSIVE METABOLIC PANEL
AST: 15 U/L (ref 0–37)
Albumin: 5.1 g/dL (ref 3.5–5.2)
Alkaline Phosphatase: 179 U/L — ABNORMAL HIGH (ref 39–117)
Alkaline Phosphatase: 157 U/L — ABNORMAL HIGH (ref 39–117)
BUN: 14 mg/dL (ref 6–23)
Chloride: 98 mEq/L (ref 96–112)
Chloride: 98 mEq/L (ref 96–112)
GFR calc Af Amer: >90 mL/min (ref >90)
GFR calc non Af Amer: >90 mL/min (ref >90)
Glucose, Bld: 155 mg/dL — ABNORMAL HIGH (ref 70–99)
Potassium: 3.3 mEq/L — ABNORMAL LOW (ref 3.5–5.1)
Potassium: 2.8 mEq/L — ABNORMAL LOW (ref 3.5–5.1)
Total Bilirubin: 0.4 mg/dL (ref 0.3–1.2)
Total Bilirubin: 0.4 mg/dL (ref 0.3–1.2)

## 2012-08-14 LAB — CBC WITH DIFFERENTIAL/PLATELET
Basophils Absolute: 0 10*3/uL (ref 0.0–0.1)
Basophils Absolute: 0 10*3/uL (ref 0.0–0.1)
Basophils Relative: 0 % (ref 0–1)
Eosinophils Relative: 0 % (ref 0–5)
Hemoglobin: 15.4 g/dL — ABNORMAL HIGH (ref 12.0–15.0)
Lymphocytes Relative: 11 % — ABNORMAL LOW (ref 12–46)
MCHC: 34.6 g/dL (ref 30.0–36.0)
Neutro Abs: 10 10*3/uL — ABNORMAL HIGH (ref 1.7–7.7)
Neutro Abs: 14.5 10*3/uL — ABNORMAL HIGH (ref 1.7–7.7)
Neutrophils Relative %: 84 % — ABNORMAL HIGH (ref 43–77)
Neutrophils Relative %: 81 % — ABNORMAL HIGH (ref 43–77)
Platelets: 573 10*3/uL — ABNORMAL HIGH (ref 150–400)
Platelets: 555 10*3/uL — ABNORMAL HIGH (ref 150–400)
RDW: 13.5 % (ref 11.5–15.5)
RDW: 13.9 % (ref 11.5–15.5)
WBC: 17.9 10*3/uL — ABNORMAL HIGH (ref 4.0–10.5)

## 2012-08-14 LAB — URINE MICROSCOPIC-ADD ON

## 2012-08-14 LAB — URINALYSIS, ROUTINE W REFLEX MICROSCOPIC
Ketones, ur: 15 mg/dL — AB
Nitrite: NEGATIVE
Protein, ur: 100 mg/dL — AB
pH: 7.5 (ref 5.0–8.0)

## 2012-08-14 LAB — LIPASE, BLOOD: Lipase: 15 U/L (ref 11–59)

## 2012-08-14 MED ORDER — SODIUM CHLORIDE 0.9 % IV BOLUS (SEPSIS)
2000.0000 mL | Freq: Once | INTRAVENOUS | Status: AC
  Administered 2012-08-14: 2000 mL via INTRAVENOUS

## 2012-08-14 MED ORDER — ONDANSETRON HCL 4 MG/2ML IJ SOLN
4.0000 mg | Freq: Once | INTRAMUSCULAR | Status: AC
  Administered 2012-08-14: 4 mg via INTRAVENOUS
  Filled 2012-08-14: qty 2

## 2012-08-14 MED ORDER — ONDANSETRON HCL 4 MG PO TABS: 4.0000 mg | ORAL_TABLET | Freq: Four times a day (QID) | ORAL | Status: DC | PRN

## 2012-08-14 MED ORDER — SODIUM CHLORIDE 0.9 % IJ SOLN
3.0000 mL | Freq: Two times a day (BID) | INTRAMUSCULAR | Status: DC
  Administered 2012-08-15 (×2): 3 mL via INTRAVENOUS

## 2012-08-14 MED ORDER — ONDANSETRON HCL 4 MG/2ML IJ SOLN
INTRAMUSCULAR | Status: AC
  Administered 2012-08-14: 4 mg via INTRAVENOUS
  Filled 2012-08-14: qty 2

## 2012-08-14 MED ORDER — ENOXAPARIN SODIUM 40 MG/0.4ML ~~LOC~~ SOLN
40.0000 mg | SUBCUTANEOUS | Status: DC
  Administered 2012-08-14 – 2012-08-15 (×2): 40 mg via SUBCUTANEOUS
  Filled 2012-08-14 (×3): qty 0.4

## 2012-08-14 MED ORDER — POTASSIUM CHLORIDE CRYS ER 20 MEQ PO TBCR
40.0000 meq | EXTENDED_RELEASE_TABLET | Freq: Once | ORAL | Status: AC
  Administered 2012-08-14: 40 meq via ORAL
  Filled 2012-08-14: qty 2

## 2012-08-14 MED ORDER — HYDROMORPHONE HCL PF 2 MG/ML IJ SOLN
2.0000 mg | Freq: Once | INTRAMUSCULAR | Status: AC
  Administered 2012-08-14: 2 mg via INTRAVENOUS
  Filled 2012-08-14: qty 1

## 2012-08-14 MED ORDER — OXYCODONE HCL 5 MG PO TABS
5.0000 mg | ORAL_TABLET | ORAL | Status: DC | PRN
  Administered 2012-08-16 (×2): 5 mg via ORAL
  Filled 2012-08-14 (×2): qty 1

## 2012-08-14 MED ORDER — ONDANSETRON HCL 4 MG/2ML IJ SOLN
4.0000 mg | Freq: Four times a day (QID) | INTRAMUSCULAR | Status: DC | PRN
  Administered 2012-08-15 (×3): 4 mg via INTRAVENOUS
  Filled 2012-08-14 (×2): qty 2

## 2012-08-14 MED ORDER — POTASSIUM CHLORIDE 10 MEQ/100ML IV SOLN
10.0000 meq | INTRAVENOUS | Status: AC
  Administered 2012-08-15 (×4): 10 meq via INTRAVENOUS
  Filled 2012-08-14 (×4): qty 100

## 2012-08-14 MED ORDER — POTASSIUM CHLORIDE IN NACL 20-0.9 MEQ/L-% IV SOLN
INTRAVENOUS | Status: DC
  Administered 2012-08-14 – 2012-08-15 (×2): via INTRAVENOUS
  Filled 2012-08-14 (×5): qty 1000

## 2012-08-14 MED ORDER — ZOLPIDEM TARTRATE 5 MG PO TABS
5.0000 mg | ORAL_TABLET | Freq: Every evening | ORAL | Status: DC | PRN
  Administered 2012-08-14 – 2012-08-15 (×2): 5 mg via ORAL
  Filled 2012-08-14 (×2): qty 1

## 2012-08-14 MED ORDER — HYDROMORPHONE HCL PF 1 MG/ML IJ SOLN
0.5000 mg | INTRAMUSCULAR | Status: AC | PRN
  Administered 2012-08-15 (×5): 1 mg via INTRAVENOUS
  Filled 2012-08-14 (×5): qty 1

## 2012-08-14 MED ORDER — INFLUENZA VIRUS VACC SPLIT PF IM SUSP
0.5000 mL | INTRAMUSCULAR | Status: AC
  Administered 2012-08-15: 0.5 mL via INTRAMUSCULAR
  Filled 2012-08-14: qty 0.5

## 2012-08-14 MED ORDER — POTASSIUM CHLORIDE 10 MEQ/100ML IV SOLN
10.0000 meq | INTRAVENOUS | Status: AC
  Administered 2012-08-14 (×3): 10 meq via INTRAVENOUS
  Filled 2012-08-14: qty 200
  Filled 2012-08-14: qty 100

## 2012-08-14 MED ORDER — SODIUM CHLORIDE 0.9 % IV BOLUS (SEPSIS)
1000.0000 mL | Freq: Once | INTRAVENOUS | Status: AC
  Administered 2012-08-14: 1000 mL via INTRAVENOUS

## 2012-08-14 MED ORDER — ACETAMINOPHEN 650 MG RE SUPP: 650.0000 mg | Freq: Four times a day (QID) | RECTAL | Status: DC | PRN

## 2012-08-14 MED ORDER — FENTANYL CITRATE 0.05 MG/ML IJ SOLN
100.0000 ug | Freq: Once | INTRAMUSCULAR | Status: AC
  Administered 2012-08-14: 100 ug via INTRAVENOUS
  Filled 2012-08-14: qty 2

## 2012-08-14 MED ORDER — SODIUM CHLORIDE 0.9 % IV SOLN
INTRAVENOUS | Status: AC
Start: 2012-08-14 — End: 2012-08-14
  Administered 2012-08-14: 21:00:00 via INTRAVENOUS

## 2012-08-14 MED ORDER — ACETAMINOPHEN 325 MG PO TABS: 650.0000 mg | ORAL_TABLET | Freq: Four times a day (QID) | ORAL | Status: DC | PRN

## 2012-08-14 MED ORDER — PNEUMOCOCCAL VAC POLYVALENT 25 MCG/0.5ML IJ INJ
0.5000 mL | INJECTION | INTRAMUSCULAR | Status: AC
  Administered 2012-08-15: 0.5 mL via INTRAMUSCULAR
  Filled 2012-08-14: qty 0.5

## 2012-08-14 MED ORDER — HYDROMORPHONE HCL PF 1 MG/ML IJ SOLN
1.0000 mg | INTRAMUSCULAR | Status: DC | PRN
  Administered 2012-08-14 – 2012-08-15 (×2): 1 mg via INTRAVENOUS
  Filled 2012-08-14 (×2): qty 1

## 2012-08-14 MED ORDER — ONDANSETRON HCL 4 MG/2ML IJ SOLN
4.0000 mg | Freq: Three times a day (TID) | INTRAMUSCULAR | Status: DC | PRN
  Administered 2012-08-14: 4 mg via INTRAVENOUS
  Filled 2012-08-14: qty 2

## 2012-08-14 MED ORDER — ALUM & MAG HYDROXIDE-SIMETH 200-200-20 MG/5ML PO SUSP: 30.0000 mL | Freq: Four times a day (QID) | ORAL | Status: DC | PRN

## 2012-08-14 MED ORDER — ONDANSETRON HCL 4 MG/2ML IJ SOLN
4.0000 mg | Freq: Once | INTRAMUSCULAR | Status: AC
  Administered 2012-08-14: 4 mg via INTRAVENOUS

## 2012-08-14 NOTE — ED Notes (Signed)
Patient transported to X-ray 

## 2012-08-14 NOTE — H&P (Signed)
[facility] History and Physical  Theresa Carey GEX:[id number] DOB: 1956-02-01 DOA: 08/14/2012  Referring physician: EDP PCP: Doran Durand, MD  Specialists:   Chief Complaint: Unremitting Nausea and Vomiting  HPI: Theresa Carey is a 56 y.o. female with a history of chronic nausea , vomiting and RUQ ABD Pain since her cholecystectomy 13 years ago, who resented to the [facility] ED with complaints of intractable N+V over the past 24 hours.  She  Denies hematemesis, and diarrhea and constipation.   She has RUQ ABD Pain, and she reports having numerous evaluations over the years and has been seen bu GI Dr. Matthias Hughs.   She had been seen in the ED when her symptoms were exacerbated and received medications, but she returned due to continued symptoms and not being able to hold down foods or liquids.  On return her Potassium level had decreased from 3.3 to 2.5.   She was referred for admission.      Review of Systems: The patient denies anorexia, fever, weight loss,, vision loss, decreased hearing, hoarseness, chest pain, syncope, dyspnea on exertion, peripheral edema, balance deficits, hemoptysis, abdominal pain, melena, hematochezia, severe indigestion/heartburn, hematuria, incontinence, genital sores, muscle weakness, suspicious skin lesions, transient blindness, difficulty walking, depression, unusual weight change, abnormal bleeding, enlarged lymph nodes, angioedema, and breast masses.    Past Medical History  Diagnosis Date  . Hypertension   . Abdominal pain 2000    chronic abdominal pain with nausea and vomiting   Past Surgical History  Procedure Date  . Abdominal hysterectomy   . Cholecystectomy     Medications:     HOME MEDS: Prior to Admission medications       Medication:    amLODipine (NORVASC) 10 MG tablet Take 10 mg by mouth daily.      lisinopril (PRINIVIL,ZESTRIL) 20 MG tablet Take 20 mg by mouth daily.      oxycodone (OXY-IR) 5 MG capsule             Take 5 mg by  mouth every 6 (six) hours as needed. For pain   Zolpidem (AMBIEN) 10 MG tablet             Take 10 mg by mouth at bedtime as needed. For sleep         Allergies: Allergen Reactions . Avelox (Moxifloxacin Hcl In Nacl)             Low blood pressure  . Compazine (Prochlorperazine Edisylate) Caused lockjaw  . Ibuprofen                                                 Hives  . Morphine And Related                            Nausea And Vomiting   . Promethazine Hcl                                     Nausea And Vomiting    Social History:  reports that she has been smoking Cigarettes.  She has been smoking about .5 packs per day. She has never used smokeless tobacco. She reports that she drinks alcohol. She reports that she does not use illicit drugs.  Family History:            Hypertension in Both Parents      Diabetes in Maternal Grandmother      Cancer in Father = Bladder Cancer      Cancer in Maternal Grandmother = Pancreatic Cancer      Cancer in Paternal Grandmother = Bone Cancer    Physical Exam:  GEN:  Pleasant 56 year old well nourished and well developed Caucasian Female  examined  and in no acute distress; cooperative with exam Filed Vitals:  08/14/12 1425 08/14/12 1500 08/14/12 1928 08/14/12 2029 BP: 143/77  134/76 152/86 Pulse: 99 94 89 89 Temp: 99.5 F (37.5 C)  98.6 F (37 C) 98.6 F (37 C) TempSrc: Oral  Oral Oral Resp: 22  18 18  Height:    5\' 5"  (1.651 m) Weight:    65.2 kg (143 lb 11.8 oz) SpO2: 100% 98% 99% 99%  Blood pressure 152/86, pulse 89, temperature 98.6 F (37 C), temperature source Oral, resp. rate 18, height 5\' 5"  (1.651 m), weight 65.2 kg (143 lb 11.8 oz), SpO2 99.00%. PSYCH: She is alert and oriented x4; does not appear anxious does not appear depressed; affect is normal HEENT: Normocephalic and Atraumatic, Mucous membranes pink; PERRLA; EOM intact; Fundi:  Benign;  No scleral icterus, Nares: Patent, Oropharynx: Clear, Fair  Dentition, Neck:  FROM, no cervical lymphadenopathy nor thyromegaly or carotid bruit; no JVD; Breasts:: Not examined CHEST WALL: No tenderness CHEST: Normal respiration, clear to auscultation bilaterally HEART: Regular rate and rhythm; no murmurs rubs or gallops BACK: No kyphosis or scoliosis; no CVA tenderness ABDOMEN: Positive Bowel Sounds, soft non-tender; no masses, no organomegaly.   Rectal Exam: Not done EXTREMITIES: No bone or joint deformity; age-appropriate arthropathy of the hands and knees; no cyanosis, clubbing or edema; no ulcerations. Genitalia: not examined PULSES: 2+ and symmetric SKIN: Normal hydration no rash or ulceration CNS: Cranial nerves 2-12 grossly intact no focal neurologic deficit    Labs on Admission:  Basic Metabolic Panel:  Lab 08/14/12 9604 08/14/12 0348  NA 141 141  K 2.8* 3.3*  CL 98 98  CO2 26 25  GLUCOSE 155* 156*  BUN 14 16  CREATININE 0.60 0.60  CALCIUM 10.1 10.4  MG -- --  PHOS -- --   Liver Function Tests:  Lab 08/14/12 1435 08/14/12 0348  AST 12 15  ALT 18 21  ALKPHOS 157* 179*  BILITOT 0.4 0.4  PROT 8.1 8.7*  ALBUMIN 4.6 5.1    Lab 08/14/12 1435  LIPASE 15  AMYLASE --   No results found for this basename: AMMONIA:5 in the last 168 hours CBC:  Lab 08/14/12 1435 08/14/12 0348  WBC 17.9* 11.9*  NEUTROABS 14.5* 10.0*  HGB 14.8 15.4*  HCT 43.9 44.5  MCV 93.6 91.8  PLT 555* 573*   Cardiac Enzymes: No results found for this basename: CKTOTAL:5,CKMB:5,CKMBINDEX:5,TROPONINI:5 in the last 168 hours  BNP (last 3 results) No results found for this basename: PROBNP:3 in the last 8760 hours CBG: No results found for this basename: GLUCAP:5 in the last 168 hours  Radiological Exams on Admission: Dg Abd Acute W/chest  08/14/2012  *RADIOLOGY REPORT*  Clinical Data: Nausea and vomiting  ACUTE ABDOMEN SERIES (ABDOMEN 2 VIEW & CHEST 1 VIEW)  Comparison: 06/16/2012  Findings: Lungs are clear without infiltrate or effusion.   Negative for heart failure or edema.  Normal bowel gas pattern.  No bowel obstruction or free air. Surgical clips in the gallbladder fossa.  3mm  left renal calcification could be a calculus or vascular in nature.  IMPRESSION: No acute abnormality.   Original Report Authenticated By: Janeece Riggers, M.D.     EKG: Independently reviewed.   Assessment: Principal Problem:  *Intractable nausea and vomiting Active Problems:  Hypokalemia  Dehydration  Chronic RUQ pain  Tobacco use disorder   Plan:        Admit to Telemetry Bed Replete K+, check Magnesium, and Replace Fluids Anti-emetics PRN Pain control PRN GI prophylaxis DVT prophylaxis    Code Status:  (must indicate code status--if unknown or must be presumed, indicate so) Family Communication:  (indicate person spoken with, if applicable, with phone number if by telephone) Disposition Plan:  (indicate anticipated LOS)  Time spent: 60 Minutes  Cyrus Ramsburg C [facility] Pager [phone]  If 7PM-7AM, please contact night-coverage [email or web address] Password [facility] 08/14/2012, 10:05 PM

## 2012-08-14 NOTE — ED Notes (Signed)
Pt continues to dry heave, per pt, when asked what has worked in past for her n/v she stated ativan, no active vomiting noted

## 2012-08-14 NOTE — ED Provider Notes (Addendum)
History     CSN: [id number]  Arrival date & time 08/14/12  1344   First MD Initiated Contact with Patient 08/14/12 1418      Chief Complaint  Patient presents with  . Abdominal Pain    (Consider location/radiation/quality/duration/timing/severity/associated sxs/prior treatment) HPI Complains of abdominal pain at right upper quadrant nonradiating onset 3 days ago, associated with multiple episodes of vomiting, hematemesis maximum temperature 100. Last bowel movement today, diarrhea, no blood per rectum. Pain is similar to pain that she's had intermittently for 13 years since having had a cholecystectomy. Patient reports she's had an extensive workup for the same symptoms in the past. ER GM clear. She does treated symptomatically with fluids, pain medicine and antiemetics. Patient seen here earlier today. Continued to vomit since leaving here earlier this morning. Nothing makes symptoms better or worse. Abdominal pain is constant, sharp, moderate in severity Past Medical History  Diagnosis Date  . Hypertension   . Abdominal pain 2000    chronic abdominal pain with nausea and vomiting    Past Surgical History  Procedure Date  . Abdominal hysterectomy   . Cholecystectomy     History reviewed. No pertinent family history.  History  Substance Use Topics  . Smoking status: Current Every Day Smoker -- 0.5 packs/day    Types: Cigarettes  . Smokeless tobacco: Never Used  . Alcohol Use: Yes     Comment: socially    OB History    Grav Para Term Preterm Abortions TAB SAB Ect Mult Living                  Review of Systems  Gastrointestinal: Positive for vomiting, abdominal pain and diarrhea.  All other systems reviewed and are negative.    Allergies  Avelox; Ibuprofen; Morphine and related; and Promethazine hcl  Home Medications   Current Outpatient Rx  Name  Route  Sig  Dispense  Refill  . AMLODIPINE BESYLATE 10 MG PO TABS   Oral   Take 10 mg by mouth daily.           . ATORVASTATIN CALCIUM 10 MG PO TABS   Oral   Take 10 mg by mouth daily.         Marland Kitchen LISINOPRIL 20 MG PO TABS   Oral   Take 20 mg by mouth daily.          Marland Kitchen METOCLOPRAMIDE HCL 10 MG PO TABS   Oral   Take 1 tablet (10 mg total) by mouth every 6 (six) hours.   30 tablet   0   . NITROFURANTOIN MONOHYD MACRO 100 MG PO CAPS   Oral   Take 1 capsule (100 mg total) by mouth 2 (two) times daily. X 7 days   14 capsule   0   . ONDANSETRON 8 MG PO TBDP      8mg  ODT q8 hours prn nausea   4 tablet   0   . OXYCODONE-ACETAMINOPHEN 5-325 MG PO TABS   Oral   Take 1 tablet by mouth every 4 (four) hours as needed. pain         . POTASSIUM CHLORIDE CRYS ER 20 MEQ PO TBCR   Oral   Take 20 mEq by mouth 2 (two) times daily.          Marland Kitchen PROMETHAZINE HCL 25 MG RE SUPP   Rectal   Place 1 suppository (25 mg total) rectally every 6 (six) hours as needed for nausea.   12 each  0   . ZOLPIDEM TARTRATE 10 MG PO TABS   Oral   Take 10 mg by mouth at bedtime as needed. sleep           BP 143/77  Pulse 99  Temp 99.5 F (37.5 C) (Oral)  Resp 22  Ht 5' 5.5" (1.664 m)  Wt 144 lb (65.318 kg)  BMI 23.60 kg/m2  SpO2 100%  Physical Exam  Nursing note and vitals reviewed. Constitutional: She appears well-developed and well-nourished.  HENT:  Head: Normocephalic and atraumatic.  Eyes: Conjunctivae normal are normal. Pupils are equal, round, and reactive to light.  Neck: Neck supple. No tracheal deviation present. No thyromegaly present.  Cardiovascular: Normal rate and regular rhythm.   No murmur heard. Pulmonary/Chest: Effort normal and breath sounds normal.  Abdominal: Soft. Bowel sounds are normal. She exhibits no distension. There is no tenderness.  Musculoskeletal: Normal range of motion. She exhibits no edema and no tenderness.  Neurological: She is alert. Coordination normal.  Skin: Skin is warm and dry. No rash noted.  Psychiatric: She has a normal mood and affect.     ED Course  Procedures (including critical care time) Feels improved after treatment with Zofran and Dilaudid iv  Labs Reviewed  COMPREHENSIVE METABOLIC PANEL  CBC WITH DIFFERENTIAL  LIPASE, BLOOD   No results found.   No diagnosis found. Results for orders placed during the hospital encounter of 08/14/12  COMPREHENSIVE METABOLIC PANEL      Component Value Range   Sodium 141  135 - 145 mEq/L   Potassium 2.8 (*) 3.5 - 5.1 mEq/L   Chloride 98  96 - 112 mEq/L   CO2 26  19 - 32 mEq/L   Glucose, Bld 155 (*) 70 - 99 mg/dL   BUN 14  6 - 23 mg/dL   Creatinine, Ser 1.61  0.50 - 1.10 mg/dL   Calcium 09.6  8.4 - 04.5 mg/dL   Total Protein 8.1  6.0 - 8.3 g/dL   Albumin 4.6  3.5 - 5.2 g/dL   AST 12  0 - 37 U/L   ALT 18  0 - 35 U/L   Alkaline Phosphatase 157 (*) 39 - 117 U/L   Total Bilirubin 0.4  0.3 - 1.2 mg/dL   GFR calc non Af Amer >90  >90 mL/min   GFR calc Af Amer >90  >90 mL/min  CBC WITH DIFFERENTIAL      Component Value Range   WBC 17.9 (*) 4.0 - 10.5 K/uL   RBC 4.69  3.87 - 5.11 MIL/uL   Hemoglobin 14.8  12.0 - 15.0 g/dL   HCT 40.9  81.1 - 91.4 %   MCV 93.6  78.0 - 100.0 fL   MCH 31.6  26.0 - 34.0 pg   MCHC 33.7  30.0 - 36.0 g/dL   RDW 78.2  95.6 - 21.3 %   Platelets 555 (*) 150 - 400 K/uL   Neutrophils Relative 81 (*) 43 - 77 %   Neutro Abs 14.5 (*) 1.7 - 7.7 K/uL   Lymphocytes Relative 11 (*) 12 - 46 %   Lymphs Abs 2.0  0.7 - 4.0 K/uL   Monocytes Relative 7  3 - 12 %   Monocytes Absolute 1.3 (*) 0.1 - 1.0 K/uL   Eosinophils Relative 0  0 - 5 %   Eosinophils Absolute 0.0  0.0 - 0.7 K/uL   Basophils Relative 0  0 - 1 %   Basophils Absolute 0.0  0.0 - 0.1  K/uL  LIPASE, BLOOD      Component Value Range   Lipase 15  11 - 59 U/L   Dg Abd Acute W/chest  08/14/2012  *RADIOLOGY REPORT*  Clinical Data: Nausea and vomiting  ACUTE ABDOMEN SERIES (ABDOMEN 2 VIEW & CHEST 1 VIEW)  Comparison: 06/16/2012  Findings: Lungs are clear without infiltrate or effusion.   Negative for heart failure or edema.  Normal bowel gas pattern.  No bowel obstruction or free air. Surgical clips in the gallbladder fossa.  3mm  left renal calcification could be a calculus or vascular in nature.  IMPRESSION: No acute abnormality.   Original Report Authenticated By: Janeece Riggers, M.D.    xrays reviewed by me  Pt signed out to Dr Silverio Lay 415 pm MDM  Dx#1 abdominal pain #2 nausea and vomiting #3 hypokalemia #4 hyperglycemia      Doug Sou, MD 08/14/12 1621  Doug Sou, MD 08/14/12 1622

## 2012-08-14 NOTE — ED Notes (Signed)
MD at bedside. 

## 2012-08-14 NOTE — ED Notes (Signed)
Pt talking with RN and family member in room, no s/s of vomiting, laughing talking without distress

## 2012-08-14 NOTE — ED Provider Notes (Addendum)
Care assumed at sign out. Theresa Carey is a 56 y.o. female hx of chronic abdominal pain with intermittent n/v here with vomiting and dehydration since yesterday. Came yesterday, had nl electrolytes, and nl US abdomen. She was discharged but then vomited again and can't tolerate PO. She was given IVF and zofran and dilaudid but is still nauseous and appeared dehydrated. Labs significant for K 2.8 and WBC 17 with nl UA and nonobstructive ab xray. She is admitted to medicine for dehydration and hypokalemia. I discussed with Dr. Ardyth Harps, who accepted the patient at [facility].     Date: 08/14/2012  Rate: 84  Rhythm: normal sinus rhythm  QRS Axis: normal  Intervals: normal  ST/T Wave abnormalities: nonspecific ST changes  Conduction Disutrbances:none  Narrative Interpretation:   Old EKG Reviewed: unchanged    Richardean Canal, MD 08/14/12 1712  Richardean Canal, MD 08/14/12 1740

## 2012-08-14 NOTE — Progress Notes (Signed)
Tyria Springer [id number] Admitted to 5503: 08/14/2012  Attending Provider: Micael Hampshire Acost*    Theresa Carey is a 56 y.o. female patient admitted from ED awake, alert  & orientated  X 3, full code, VSS - Blood pressure 152/86, pulse 89, temperature 98.6 F (37 C), temperature source Oral, resp. rate 18, height 5\' 5"  (1.651 m), weight 65.2 kg (143 lb 11.8 oz), SpO2 99.00%. RA, no c/o shortness of breath, no c/o chest pain, no distress noted. Tele # [phone] placed and pt is currently running:normal sinus rhythm.   IV site WDL:  antecubital right, condition patent and no redness with a transparent dsg that's clean dry and intact.  Allergies:   Allergies  Allergen Reactions  . Avelox (Moxifloxacin Hcl In Nacl)     Low blood pressure  . Compazine (Prochlorperazine Edisylate) Other (See Comments)    Caused lockjaw  . Ibuprofen Hives  . Morphine And Related Nausea And Vomiting  . Promethazine Hcl Nausea And Vomiting     Past Medical History  Diagnosis Date  . Hypertension   . Abdominal pain 2000    chronic abdominal pain with nausea and vomiting    History:  obtained from the patient.  Pt orientation to unit, room and routine. Information packet given to patient/family and safety video watched.  Admission INP armband ID verified with patient/family, and in place. SR up x 2, fall risk assessment complete with Patient and family verbalizing understanding of risks associated with falls. Pt verbalizes an understanding of how to use the call bell and to call for help before getting out of bed.  Skin, clean-dry- intact without evidence of bruising, or skin tears.   No evidence of skin break down noted on exam.    Will cont to monitor and assist as needed.  Julien Nordmann [facility], RN 08/14/2012 9:48 PM

## 2012-08-14 NOTE — ED Provider Notes (Signed)
History     CSN: [id number]  Arrival date & time 08/14/12  0218   First MD Initiated Contact with Patient 08/14/12 0350      Chief Complaint  Patient presents with  . Abdominal Pain    (Consider location/radiation/quality/duration/timing/severity/associated sxs/prior treatment) Patient is a 56 y.o. female presenting with abdominal pain. The history is provided by the patient.  Abdominal Pain The primary symptoms of the illness include abdominal pain, nausea and vomiting. The primary symptoms of the illness do not include diarrhea or dysuria. The current episode started more than 2 days ago. The onset of the illness was gradual. The problem has not changed since onset. The abdominal pain began 2 days ago. The pain came on gradually. The abdominal pain has been unchanged since its onset. The abdominal pain is located in the RUQ. The abdominal pain does not radiate. The abdominal pain is relieved by nothing. The abdominal pain is exacerbated by vomiting.  The vomiting began more than 2 days ago. Frequency: 1500 cc per pt report. The emesis contains stomach contents.  Associated with: chronic pain and vomiting. The patient has not had a change in bowel habit. Risk factors for an acute abdominal problem include a history of abdominal surgery. Symptoms associated with the illness do not include constipation, urgency or frequency. Significant associated medical issues do not include inflammatory bowel disease.    Past Medical History  Diagnosis Date  . Hypertension   . Abdominal pain 2000    chronic abdominal pain with nausea and vomiting    Past Surgical History  Procedure Date  . Abdominal hysterectomy   . Cholecystectomy     History reviewed. No pertinent family history.  History  Substance Use Topics  . Smoking status: Current Every Day Smoker -- 0.5 packs/day    Types: Cigarettes  . Smokeless tobacco: Never Used  . Alcohol Use: Yes     Comment: socially    OB History    Grav Para Term Preterm Abortions TAB SAB Ect Mult Living                  Review of Systems  Gastrointestinal: Positive for nausea, vomiting and abdominal pain. Negative for diarrhea and constipation.  Genitourinary: Negative for dysuria, urgency and frequency.  All other systems reviewed and are negative.    Allergies  Avelox; Ibuprofen; Morphine and related; and Promethazine hcl  Home Medications   Current Outpatient Rx  Name  Route  Sig  Dispense  Refill  . AMLODIPINE BESYLATE 10 MG PO TABS   Oral   Take 10 mg by mouth daily.          . ATORVASTATIN CALCIUM 10 MG PO TABS   Oral   Take 10 mg by mouth daily.         Marland Kitchen LISINOPRIL 20 MG PO TABS   Oral   Take 20 mg by mouth daily.          . OXYCODONE-ACETAMINOPHEN 5-325 MG PO TABS   Oral   Take 1 tablet by mouth every 4 (four) hours as needed. pain         . POTASSIUM CHLORIDE CRYS ER 20 MEQ PO TBCR   Oral   Take 20 mEq by mouth 2 (two) times daily.          Marland Kitchen ZOLPIDEM TARTRATE 10 MG PO TABS   Oral   Take 10 mg by mouth at bedtime as needed. sleep         .  METOCLOPRAMIDE HCL 10 MG PO TABS   Oral   Take 1 tablet (10 mg total) by mouth every 6 (six) hours.   30 tablet   0   . NITROFURANTOIN MONOHYD MACRO 100 MG PO CAPS   Oral   Take 1 capsule (100 mg total) by mouth 2 (two) times daily. X 7 days   14 capsule   0   . ONDANSETRON 8 MG PO TBDP      8mg  ODT q8 hours prn nausea   4 tablet   0   . PROMETHAZINE HCL 25 MG RE SUPP   Rectal   Place 1 suppository (25 mg total) rectally every 6 (six) hours as needed for nausea.   12 each   0     BP 156/100  Pulse 115  Temp 98.9 F (37.2 C) (Oral)  Resp 18  SpO2 100%  Physical Exam  Constitutional: She is oriented to person, place, and time. She appears well-developed and well-nourished. No distress.  HENT:  Head: Normocephalic and atraumatic.  Mouth/Throat: Oropharynx is clear and moist.  Eyes: Conjunctivae normal are normal. Pupils are  equal, round, and reactive to light.  Neck: Normal range of motion.  Cardiovascular: Normal rate and regular rhythm.   Pulmonary/Chest: Effort normal and breath sounds normal. She has no wheezes. She has no rales.  Abdominal: Soft. Bowel sounds are normal. There is no tenderness. There is no rebound and no guarding.  Musculoskeletal: Normal range of motion.  Neurological: She is alert and oriented to person, place, and time.  Skin: Skin is warm and dry.  Psychiatric: She has a normal mood and affect.    ED Course  Procedures (including critical care time)  Labs Reviewed  URINALYSIS, ROUTINE W REFLEX MICROSCOPIC - Abnormal; Notable for the following:    Color, Urine AMBER (*)  BIOCHEMICALS MAY BE AFFECTED BY COLOR   APPearance CLOUDY (*)     Hgb urine dipstick SMALL (*)     Bilirubin Urine SMALL (*)     Ketones, ur 15 (*)     Protein, ur 100 (*)     Leukocytes, UA SMALL (*)     All other components within normal limits  CBC WITH DIFFERENTIAL - Abnormal; Notable for the following:    WBC 11.9 (*)     Hemoglobin 15.4 (*)     Platelets 573 (*)     Neutrophils Relative 84 (*)     Neutro Abs 10.0 (*)     All other components within normal limits  URINE MICROSCOPIC-ADD ON - Abnormal; Notable for the following:    Squamous Epithelial / LPF FEW (*)     Bacteria, UA MANY (*)     Casts HYALINE CASTS (*)     All other components within normal limits  COMPREHENSIVE METABOLIC PANEL   No results found.   No diagnosis found.    MDM  Chronic vomiting and abdominal pain.  No indication for imaging.         Jasmine Awe, MD 08/14/12 [phone]

## 2012-08-14 NOTE — ED Notes (Signed)
Pt was taken by [facility],  Report was given to [facility] on 5500

## 2012-08-14 NOTE — ED Notes (Signed)
Pt states she was here from 1a-6a this a.m. Still vomiting. Here last month for same.

## 2012-08-14 NOTE — ED Notes (Signed)
Pt developed n/v yesterday am, unable to keep po fluid and food down

## 2012-08-15 LAB — BASIC METABOLIC PANEL
BUN: 8 mg/dL (ref 6–23)
CO2: 24 mEq/L (ref 19–32)
CO2: 22 mEq/L (ref 19–32)
Calcium: 8.5 mg/dL (ref 8.4–10.5)
Calcium: 8.7 mg/dL (ref 8.4–10.5)
Creatinine, Ser: 0.49 mg/dL — ABNORMAL LOW (ref 0.50–1.10)
GFR calc Af Amer: >90 mL/min (ref >90)
GFR calc non Af Amer: >90 mL/min (ref >90)
Glucose, Bld: 97 mg/dL (ref 70–99)
Sodium: 136 mEq/L (ref 135–145)

## 2012-08-15 LAB — CBC
MCH: 30.9 pg (ref 26.0–34.0)
MCHC: 32.1 g/dL (ref 30.0–36.0)
RBC: 3.5 MIL/uL — ABNORMAL LOW (ref 3.87–5.11)
RDW: 13.8 % (ref 11.5–15.5)
WBC: 10.2 10*3/uL (ref 4.0–10.5)

## 2012-08-15 NOTE — Progress Notes (Signed)
[facility] PROGRESS NOTE  Theresa Carey ZOX:[id number] DOB: 1956-01-06 DOA: 08/14/2012 PCP: Doran Durand, MD  Assessment/Plan: N/V x 13 years- comes and goes- patient has been worked up at [facility] for this, plan to continue IVF until patient eating, advance diet as tolerated Hypokalemia- repleated Pain- likes dilaudid IV- instructed that after 7 AM tomm she will not get dilaudid any longer and we will resume her home medications Leukocytosis- resolved   Code Status: full Family Communication: patient at bedside Disposition Plan: home tomm AM    HPI/Subjective: 13 years of N/V/D  Objective: Filed Vitals:   08/14/12 2029 08/15/12 0229 08/15/12 0452 08/15/12 0944  BP: 152/86 124/69 111/77 134/83  Pulse: 89 76 78 76  Temp: 98.6 F (37 C) 98.2 F (36.8 C) 98.8 F (37.1 C) 99.1 F (37.3 C)  TempSrc: Oral Oral Oral Oral  Resp: 18 20 18 18   Height: 5\' 5"  (1.651 m)     Weight: 65.2 kg (143 lb 11.8 oz)     SpO2: 99% 98% 95% 94%    Intake/Output Summary (Last 24 hours) at 08/15/12 1017 Last data filed at 08/15/12 0915  Gross per 24 hour  Intake 2102.41 ml  Output      0 ml  Net 2102.41 ml   Filed Weights   08/14/12 1349 08/14/12 2029  Weight: 65.318 kg (144 lb) 65.2 kg (143 lb 11.8 oz)    Exam:   General:  A+Ox3, NAD  Cardiovascular: rrr  Respiratory: clear anterior  Abdomen: +BS, soft, NT/ND  Data Reviewed: Basic Metabolic Panel:  Lab 08/15/12 8119 08/14/12 2326 08/14/12 1435 08/14/12 0348  NA 136 138 141 141  K 3.9 3.3* 2.8* 3.3*  CL 104 104 98 98  CO2 22 24 26 25   GLUCOSE 96 97 155* 156*  BUN 7 8 14 16   CREATININE 0.45* 0.49* 0.60 0.60  CALCIUM 8.7 8.5 10.1 10.4  MG -- 1.9 -- --  PHOS -- -- -- --   Liver Function Tests:  Lab 08/14/12 1435 08/14/12 0348  AST 12 15  ALT 18 21  ALKPHOS 157* 179*  BILITOT 0.4 0.4  PROT 8.1 8.7*  ALBUMIN 4.6 5.1    Lab 08/14/12 1435  LIPASE 15  AMYLASE --   No results found for this basename:  AMMONIA:5 in the last 168 hours CBC:  Lab 08/15/12 0600 08/14/12 1435 08/14/12 0348  WBC 10.2 17.9* 11.9*  NEUTROABS -- 14.5* 10.0*  HGB 10.8* 14.8 15.4*  HCT 33.6* 43.9 44.5  MCV 96.0 93.6 91.8  PLT 390 555* 573*   Cardiac Enzymes: No results found for this basename: CKTOTAL:5,CKMB:5,CKMBINDEX:5,TROPONINI:5 in the last 168 hours BNP (last 3 results) No results found for this basename: PROBNP:3 in the last 8760 hours CBG: No results found for this basename: GLUCAP:5 in the last 168 hours  No results found for this or any previous visit (from the past 240 hour(s)).   Studies: Dg Abd Acute W/chest  08/14/2012  *RADIOLOGY REPORT*  Clinical Data: Nausea and vomiting  ACUTE ABDOMEN SERIES (ABDOMEN 2 VIEW & CHEST 1 VIEW)  Comparison: 06/16/2012  Findings: Lungs are clear without infiltrate or effusion.  Negative for heart failure or edema.  Normal bowel gas pattern.  No bowel obstruction or free air. Surgical clips in the gallbladder fossa.  3mm  left renal calcification could be a calculus or vascular in nature.  IMPRESSION: No acute abnormality.   Original Report Authenticated By: Janeece Riggers, M.D.     Scheduled Meds:   .  enoxaparin (LOVENOX) injection  40 mg Subcutaneous Q24H  . influenza  inactive virus vaccine  0.5 mL Intramuscular Tomorrow-1000  . pneumococcal 23 valent vaccine  0.5 mL Intramuscular Tomorrow-1000  . sodium chloride  3 mL Intravenous Q12H   Continuous Infusions:   . 0.9 % NaCl with KCl 20 mEq / L 100 mL/hr at 08/14/12 2303    Principal Problem:  *Intractable nausea and vomiting Active Problems:  Hypokalemia  Dehydration  Chronic RUQ pain  Tobacco use disorder    Time spent: 35    [facility], Celie Desrochers  [facility] Pager [phone]. If 8PM-8AM, please contact night-coverage at [email or web address], password [facility] 08/15/2012, 10:17 AM  LOS: 1 day

## 2012-08-16 LAB — BASIC METABOLIC PANEL
BUN: 7 mg/dL (ref 6–23)
Calcium: 9.1 mg/dL (ref 8.4–10.5)
Creatinine, Ser: 0.64 mg/dL (ref 0.50–1.10)
GFR calc non Af Amer: >90 mL/min (ref >90)
Glucose, Bld: 125 mg/dL — ABNORMAL HIGH (ref 70–99)

## 2012-08-16 NOTE — Progress Notes (Signed)
1105 Discharge instructions reviewed with patient verbalized and understands . Skin WNL, left floor ambulatory with staff.

## 2012-08-16 NOTE — Discharge Summary (Signed)
Physician Discharge Summary  Theresa Carey UJW:[id number] DOB: 02-08-1956 DOA: 08/14/2012  PCP: Doran Durand, MD  Admit date: 08/14/2012 Discharge date: 08/16/2012  Time spent: 35 minutes  Recommendations for Outpatient Follow-up:  1. BMP 1 week re K  Discharge Diagnoses:  Principal Problem:  *Intractable nausea and vomiting Active Problems:  Hypokalemia  Dehydration  Chronic RUQ pain  Tobacco use disorder   Discharge Condition: improved  Diet recommendation: regular  Filed Weights   08/14/12 1349 08/14/12 2029  Weight: 65.318 kg (144 lb) 65.2 kg (143 lb 11.8 oz)    History of present illness:  Theresa Carey is a 56 y.o. female with a history of chronic nausea , vomiting and RUQ ABD Pain since her cholecystectomy 13 years ago, who resented to the [facility] ED with complaints of intractable N+V over the past 24 hours. She Denies hematemesis, and diarrhea and constipation. She has RUQ ABD Pain, and she reports having numerous evaluations over the years and has been seen bu GI Dr. Matthias Hughs. She had been seen in the ED when her symptoms were exacerbated and received medications, but she returned due to continued symptoms and not being able to hold down foods or liquids. On return her Potassium level had decreased from 3.3 to 2.5. She was referred for admission.    Hospital Course:  N/V x 13 years- comes and goes- patient has been worked up at [facility] for this, treated with IVF,  patient eating, advance diet as tolerated  Hypokalemia- repleated  Pain- likes dilaudid IV- instructed that after 7 AM tomm she will not get dilaudid any longer and we will resume her home medications  Leukocytosis- resolved    Discharge Exam: Filed Vitals:   08/15/12 0944 08/15/12 1440 08/15/12 2225 08/16/12 0548  BP: 134/83 132/80 117/73 129/76  Pulse: 76 74 75 72  Temp: 99.1 F (37.3 C) 98.7 F (37.1 C) 98.3 F (36.8 C) 99 F (37.2 C)  TempSrc: Oral Oral Oral Oral  Resp: 18 16 18 20    Height:      Weight:      SpO2: 94% 98% 96% 97%    General: A+Ox3, Nad Cardiovascular: rrr Respiratory: clear  Discharge Instructions     Medication List     As of 08/16/2012  8:19 AM    ASK your doctor about these medications         AMBIEN 10 MG tablet   Generic drug: zolpidem   Take 10 mg by mouth at bedtime as needed. For sleep      amLODipine 10 MG tablet   Commonly known as: NORVASC   Take 10 mg by mouth daily.      lisinopril 20 MG tablet   Commonly known as: PRINIVIL,ZESTRIL   Take 20 mg by mouth daily.      oxycodone 5 MG capsule   Commonly known as: OXY-IR   Take 5 mg by mouth every 6 (six) hours as needed. For pain          The results of significant diagnostics from this hospitalization (including imaging, microbiology, ancillary and laboratory) are listed below for reference.    Significant Diagnostic Studies: Dg Abd Acute W/chest  08/14/2012  *RADIOLOGY REPORT*  Clinical Data: Nausea and vomiting  ACUTE ABDOMEN SERIES (ABDOMEN 2 VIEW & CHEST 1 VIEW)  Comparison: 06/16/2012  Findings: Lungs are clear without infiltrate or effusion.  Negative for heart failure or edema.  Normal bowel gas pattern.  No bowel obstruction or free air. Surgical clips in the  gallbladder fossa.  3mm  left renal calcification could be a calculus or vascular in nature.  IMPRESSION: No acute abnormality.   Original Report Authenticated By: Janeece Riggers, M.D.     Microbiology: No results found for this or any previous visit (from the past 240 hour(s)).   Labs: Basic Metabolic Panel:  Lab 08/16/12 1610 08/15/12 0600 08/14/12 2326 08/14/12 1435 08/14/12 0348  NA 135 136 138 141 141  K 3.7 3.9 3.3* 2.8* 3.3*  CL 100 104 104 98 98  CO2 26 22 24 26 25   GLUCOSE 125* 96 97 155* 156*  BUN 7 7 8 14 16   CREATININE 0.64 0.45* 0.49* 0.60 0.60  CALCIUM 9.1 8.7 8.5 10.1 10.4  MG -- -- 1.9 -- --  PHOS -- -- -- -- --   Liver Function Tests:  Lab 08/14/12 1435 08/14/12 0348  AST  12 15  ALT 18 21  ALKPHOS 157* 179*  BILITOT 0.4 0.4  PROT 8.1 8.7*  ALBUMIN 4.6 5.1    Lab 08/14/12 1435  LIPASE 15  AMYLASE --   No results found for this basename: AMMONIA:5 in the last 168 hours CBC:  Lab 08/15/12 0600 08/14/12 1435 08/14/12 0348  WBC 10.2 17.9* 11.9*  NEUTROABS -- 14.5* 10.0*  HGB 10.8* 14.8 15.4*  HCT 33.6* 43.9 44.5  MCV 96.0 93.6 91.8  PLT 390 555* 573*   Cardiac Enzymes: No results found for this basename: CKTOTAL:5,CKMB:5,CKMBINDEX:5,TROPONINI:5 in the last 168 hours BNP: BNP (last 3 results) No results found for this basename: PROBNP:3 in the last 8760 hours CBG: No results found for this basename: GLUCAP:5 in the last 168 hours     Signed:  Marlin Canary  [facility] 08/16/2012, 8:19 AM

## 2012-08-16 NOTE — Care Management Note (Signed)
    Page 1 of 1   08/16/2012     10:42:30 AM   CARE MANAGEMENT NOTE 08/16/2012  Patient:  Theresa Carey, Theresa Carey   Account Number:  [id number]  Date Initiated:  08/16/2012  Documentation initiated by:  Letha Cape  Subjective/Objective Assessment:   dx intractable n/v  admit- lives alone, pta indepenedent.     Action/Plan:   Anticipated DC Date:  08/16/2012   Anticipated DC Plan:  HOME/SELF CARE      DC Planning Services  CM consult      Choice offered to / List presented to:             Status of service:  Completed, signed off Medicare Important Message given?   (If response is "NO", the following Medicare IM given date fields will be blank) Date Medicare IM given:   Date Additional Medicare IM given:    Discharge Disposition:  HOME/SELF CARE  Per UR Regulation:  Reviewed for med. necessity/level of care/duration of stay  If discussed at Long Length of Stay Meetings, dates discussed:    Comments:  08/16/12 10:40 Letha Cape RN, BSN [id number] patient lives alone, pta independent.  Patient states she has transportation and states she only takes bp meds and pain meds and she has these at home she does not need help with getting these.  Patient is for dc today.  No needs anticipated.

## 2012-08-17 ENCOUNTER — Encounter (HOSPITAL_BASED_OUTPATIENT_CLINIC_OR_DEPARTMENT_OTHER): Payer: Self-pay

## 2012-08-17 ENCOUNTER — Emergency Department (HOSPITAL_BASED_OUTPATIENT_CLINIC_OR_DEPARTMENT_OTHER)
Admission: EM | Admit: 2012-08-17 | Discharge: 2012-08-17 | Disposition: A | Discharge status: Home / Self Care | Payer: Self-pay | Attending: Emergency Medicine | Admitting: Emergency Medicine

## 2012-08-17 DIAGNOSIS — G8929 Other chronic pain: Secondary | ICD-10-CM | POA: Insufficient documentation

## 2012-08-17 DIAGNOSIS — E876 Hypokalemia: Secondary | ICD-10-CM | POA: Insufficient documentation

## 2012-08-17 DIAGNOSIS — Z79899 Other long term (current) drug therapy: Secondary | ICD-10-CM | POA: Insufficient documentation

## 2012-08-17 DIAGNOSIS — Z833 Family history of diabetes mellitus: Secondary | ICD-10-CM | POA: Insufficient documentation

## 2012-08-17 DIAGNOSIS — I10 Essential (primary) hypertension: Secondary | ICD-10-CM | POA: Insufficient documentation

## 2012-08-17 DIAGNOSIS — R1110 Vomiting, unspecified: Secondary | ICD-10-CM

## 2012-08-17 DIAGNOSIS — R112 Nausea with vomiting, unspecified: Secondary | ICD-10-CM | POA: Insufficient documentation

## 2012-08-17 DIAGNOSIS — Z8719 Personal history of other diseases of the digestive system: Secondary | ICD-10-CM | POA: Insufficient documentation

## 2012-08-17 DIAGNOSIS — Z8249 Family history of ischemic heart disease and other diseases of the circulatory system: Secondary | ICD-10-CM | POA: Insufficient documentation

## 2012-08-17 DIAGNOSIS — Z809 Family history of malignant neoplasm, unspecified: Secondary | ICD-10-CM | POA: Insufficient documentation

## 2012-08-17 DIAGNOSIS — F17200 Nicotine dependence, unspecified, uncomplicated: Secondary | ICD-10-CM | POA: Insufficient documentation

## 2012-08-17 LAB — CBC WITH DIFFERENTIAL/PLATELET
Basophils Absolute: 0 10*3/uL (ref 0.0–0.1)
Basophils Relative: 0 % (ref 0–1)
Lymphocytes Relative: 18 % (ref 12–46)
MCHC: 34.3 g/dL (ref 30.0–36.0)
Monocytes Absolute: 1 10*3/uL (ref 0.1–1.0)
Neutro Abs: 10.1 10*3/uL — ABNORMAL HIGH (ref 1.7–7.7)
Neutrophils Relative %: 74 % (ref 43–77)
Platelets: 504 10*3/uL — ABNORMAL HIGH (ref 150–400)
RDW: 13.1 % (ref 11.5–15.5)
WBC: 13.7 10*3/uL — ABNORMAL HIGH (ref 4.0–10.5)

## 2012-08-17 LAB — URINALYSIS, ROUTINE W REFLEX MICROSCOPIC
Bilirubin Urine: NEGATIVE
Glucose, UA: NEGATIVE mg/dL
Specific Gravity, Urine: 1.016 (ref 1.005–1.030)
Urobilinogen, UA: 1 mg/dL (ref 0.0–1.0)

## 2012-08-17 LAB — COMPREHENSIVE METABOLIC PANEL
ALT: 17 U/L (ref 0–35)
Alkaline Phosphatase: 138 U/L — ABNORMAL HIGH (ref 39–117)
BUN: 7 mg/dL (ref 6–23)
CO2: 25 mEq/L (ref 19–32)
GFR calc Af Amer: >90 mL/min (ref >90)
GFR calc non Af Amer: >90 mL/min (ref >90)
Glucose, Bld: 130 mg/dL — ABNORMAL HIGH (ref 70–99)
Potassium: 3 mEq/L — ABNORMAL LOW (ref 3.5–5.1)
Sodium: 140 mEq/L (ref 135–145)
Total Bilirubin: 0.3 mg/dL (ref 0.3–1.2)
Total Protein: 7.7 g/dL (ref 6.0–8.3)

## 2012-08-17 LAB — LIPASE, BLOOD: Lipase: 20 U/L (ref 11–59)

## 2012-08-17 LAB — URINE MICROSCOPIC-ADD ON

## 2012-08-17 MED ORDER — POTASSIUM CHLORIDE CRYS ER 20 MEQ PO TBCR
EXTENDED_RELEASE_TABLET | ORAL | Status: AC
  Filled 2012-08-17: qty 2

## 2012-08-17 MED ORDER — HYDROMORPHONE HCL PF 1 MG/ML IJ SOLN
1.0000 mg | Freq: Once | INTRAMUSCULAR | Status: AC
  Administered 2012-08-17: 1 mg via INTRAVENOUS
  Filled 2012-08-17: qty 1

## 2012-08-17 MED ORDER — ONDANSETRON HCL 4 MG/2ML IJ SOLN
INTRAMUSCULAR | Status: AC
  Administered 2012-08-17: 13:00:00
  Filled 2012-08-17: qty 2

## 2012-08-17 MED ORDER — POTASSIUM CHLORIDE CRYS ER 20 MEQ PO TBCR
40.0000 meq | EXTENDED_RELEASE_TABLET | Freq: Once | ORAL | Status: AC
  Administered 2012-08-17: 40 meq via ORAL

## 2012-08-17 MED ORDER — ONDANSETRON HCL 4 MG/2ML IJ SOLN
4.0000 mg | Freq: Once | INTRAMUSCULAR | Status: AC
  Administered 2012-08-17: 4 mg via INTRAVENOUS
  Filled 2012-08-17: qty 2

## 2012-08-17 MED ORDER — HYDROMORPHONE HCL PF 1 MG/ML IJ SOLN
INTRAMUSCULAR | Status: AC
  Administered 2012-08-17: 13:00:00
  Filled 2012-08-17: qty 1

## 2012-08-17 MED ORDER — ONDANSETRON HCL 4 MG/2ML IJ SOLN
INTRAMUSCULAR | Status: AC
  Administered 2012-08-17: 10:00:00
  Filled 2012-08-17: qty 2

## 2012-08-17 MED ORDER — SODIUM CHLORIDE 0.9 % IV BOLUS (SEPSIS)
1000.0000 mL | Freq: Once | INTRAVENOUS | Status: AC
  Administered 2012-08-17: 1000 mL via INTRAVENOUS

## 2012-08-17 NOTE — ED Provider Notes (Signed)
History     CSN: [id number]  Arrival date & time 08/17/12  0913   First MD Initiated Contact with Patient 08/17/12 1103      Chief Complaint  Patient presents with  . Abdominal Pain  . Emesis    (Consider location/radiation/quality/duration/timing/severity/associated sxs/prior treatment) HPI Comments: Patient history of recurrent episodes of right upper quadrant abdominal pain associated with nausea and vomiting. She is status post cholecystectomy and has recurrent episodes of these symptoms since that time 13 years ago. She was recently admitted to [facility] for intractable vomiting and was discharged yesterday. She states she's feeling okay on discharge and started having the vomiting and pain again today. She's had recurrent episodes of vomiting. She denies any diarrhea. She states is pain is the same as her typical abdominal pain. She denies any urinary complaints. Denies any diarrhea. She takes OxyContin 5 mg tablets at home for pain. She did not take anything for emesis at home given that she says the oral Zofran doesn't work and she has allergies to Phenergan and Compazine.  Patient is a 56 y.o. female presenting with abdominal pain and vomiting.  Abdominal Pain The primary symptoms of the illness include abdominal pain, nausea and vomiting. The primary symptoms of the illness do not include fever, fatigue, shortness of breath or diarrhea.  Symptoms associated with the illness do not include chills, diaphoresis, hematuria, frequency or back pain.  Emesis  Associated symptoms include abdominal pain. Pertinent negatives include no arthralgias, no chills, no cough, no diarrhea, no fever and no headaches.    Past Medical History  Diagnosis Date  . Hypertension   . Abdominal pain 2000    chronic abdominal pain with nausea and vomiting    Past Surgical History  Procedure Date  . Abdominal hysterectomy   . Cholecystectomy     Family History  Problem Relation Age of Onset  .  Hypertension Father   . Hypertension Mother   . Diabetes Maternal Grandmother   . Cancer - Other Maternal Grandmother     Pancreatic  . Cancer - Other Paternal Grandmother     Bone  . Cancer - Other Father     Bladder    History  Substance Use Topics  . Smoking status: Current Every Day Smoker -- 0.5 packs/day    Types: Cigarettes  . Smokeless tobacco: Never Used  . Alcohol Use: Yes     Comment: socially    OB History    Grav Para Term Preterm Abortions TAB SAB Ect Mult Living                  Review of Systems  Constitutional: Negative for fever, chills, diaphoresis and fatigue.  HENT: Negative for congestion, rhinorrhea and sneezing.   Eyes: Negative.   Respiratory: Negative for cough, chest tightness and shortness of breath.   Cardiovascular: Negative for chest pain and leg swelling.  Gastrointestinal: Positive for nausea, vomiting and abdominal pain. Negative for diarrhea and blood in stool.  Genitourinary: Negative for frequency, hematuria, flank pain and difficulty urinating.  Musculoskeletal: Negative for back pain and arthralgias.  Skin: Negative for rash.  Neurological: Negative for dizziness, speech difficulty, weakness, numbness and headaches.    Allergies  Avelox; Compazine; Ibuprofen; Morphine and related; and Promethazine hcl  Home Medications   Current Outpatient Rx  Name  Route  Sig  Dispense  Refill  . AMLODIPINE BESYLATE 10 MG PO TABS   Oral   Take 10 mg by mouth daily.          Marland Kitchen  LISINOPRIL 20 MG PO TABS   Oral   Take 20 mg by mouth daily.          . OXYCODONE HCL 5 MG PO CAPS   Oral   Take 5 mg by mouth every 6 (six) hours as needed. For pain         . ZOLPIDEM TARTRATE 10 MG PO TABS   Oral   Take 10 mg by mouth at bedtime as needed. For sleep           BP 138/70  Pulse 103  Temp 98.5 F (36.9 C) (Oral)  Resp 16  Ht 5' 5.5" (1.664 m)  Wt 144 lb (65.318 kg)  BMI 23.60 kg/m2  SpO2 98%  Physical Exam  Constitutional:  She is oriented to person, place, and time. She appears well-developed and well-nourished.  HENT:  Head: Normocephalic and atraumatic.       Dry mucous membranes  Eyes: Pupils are equal, round, and reactive to light.  Neck: Normal range of motion. Neck supple.  Cardiovascular: Normal rate, regular rhythm and normal heart sounds.   Pulmonary/Chest: Effort normal and breath sounds normal. No respiratory distress. She has no wheezes. She has no rales. She exhibits no tenderness.  Abdominal: Soft. Bowel sounds are normal. There is tenderness (moderate tenderness to the upper abdomen.). There is no rebound and no guarding.  Musculoskeletal: Normal range of motion. She exhibits no edema.  Lymphadenopathy:    She has no cervical adenopathy.  Neurological: She is alert and oriented to person, place, and time.  Skin: Skin is warm and dry. No rash noted.  Psychiatric: She has a normal mood and affect.    ED Course  Procedures (including critical care time)  Results for orders placed during the hospital encounter of 08/17/12  URINALYSIS, ROUTINE W REFLEX MICROSCOPIC      Component Value Range   Color, Urine YELLOW  YELLOW   APPearance CLOUDY (*) CLEAR   Specific Gravity, Urine 1.016  1.005 - 1.030   pH 7.0  5.0 - 8.0   Glucose, UA NEGATIVE  NEGATIVE mg/dL   Hgb urine dipstick SMALL (*) NEGATIVE   Bilirubin Urine NEGATIVE  NEGATIVE   Ketones, ur 15 (*) NEGATIVE mg/dL   Protein, ur NEGATIVE  NEGATIVE mg/dL   Urobilinogen, UA 1.0  0.0 - 1.0 mg/dL   Nitrite NEGATIVE  NEGATIVE   Leukocytes, UA SMALL (*) NEGATIVE  COMPREHENSIVE METABOLIC PANEL      Component Value Range   Sodium 140  135 - 145 mEq/L   Potassium 3.0 (*) 3.5 - 5.1 mEq/L   Chloride 100  96 - 112 mEq/L   CO2 25  19 - 32 mEq/L   Glucose, Bld 130 (*) 70 - 99 mg/dL   BUN 7  6 - 23 mg/dL   Creatinine, Ser 1.61  0.50 - 1.10 mg/dL   Calcium 9.6  8.4 - 09.6 mg/dL   Total Protein 7.7  6.0 - 8.3 g/dL   Albumin 4.5  3.5 - 5.2 g/dL    AST 14  0 - 37 U/L   ALT 17  0 - 35 U/L   Alkaline Phosphatase 138 (*) 39 - 117 U/L   Total Bilirubin 0.3  0.3 - 1.2 mg/dL   GFR calc non Af Amer >90  >90 mL/min   GFR calc Af Amer >90  >90 mL/min  CBC WITH DIFFERENTIAL      Component Value Range   WBC 13.7 (*) 4.0 - 10.5  K/uL   RBC 4.60  3.87 - 5.11 MIL/uL   Hemoglobin 14.7  12.0 - 15.0 g/dL   HCT 47.8  29.5 - 62.1 %   MCV 93.3  78.0 - 100.0 fL   MCH 32.0  26.0 - 34.0 pg   MCHC 34.3  30.0 - 36.0 g/dL   RDW 30.8  65.7 - 84.6 %   Platelets 504 (*) 150 - 400 K/uL   Neutrophils Relative 74  43 - 77 %   Neutro Abs 10.1 (*) 1.7 - 7.7 K/uL   Lymphocytes Relative 18  12 - 46 %   Lymphs Abs 2.5  0.7 - 4.0 K/uL   Monocytes Relative 7  3 - 12 %   Monocytes Absolute 1.0  0.1 - 1.0 K/uL   Eosinophils Relative 1  0 - 5 %   Eosinophils Absolute 0.1  0.0 - 0.7 K/uL   Basophils Relative 0  0 - 1 %   Basophils Absolute 0.0  0.0 - 0.1 K/uL  LIPASE, BLOOD      Component Value Range   Lipase 20  11 - 59 U/L  URINE MICROSCOPIC-ADD ON      Component Value Range   Squamous Epithelial / LPF MANY (*) RARE   WBC, UA 3-6  <3 WBC/hpf   RBC / HPF 3-6  <3 RBC/hpf   Bacteria, UA MANY (*) RARE   Casts HYALINE CASTS (*) NEGATIVE   Urine-Other MUCOUS PRESENT     Dg Abd Acute W/chest  08/14/2012  *RADIOLOGY REPORT*  Clinical Data: Nausea and vomiting  ACUTE ABDOMEN SERIES (ABDOMEN 2 VIEW & CHEST 1 VIEW)  Comparison: 06/16/2012  Findings: Lungs are clear without infiltrate or effusion.  Negative for heart failure or edema.  Normal bowel gas pattern.  No bowel obstruction or free air. Surgical clips in the gallbladder fossa.  3mm  left renal calcification could be a calculus or vascular in nature.  IMPRESSION: No acute abnormality.   Original Report Authenticated By: Janeece Riggers, M.D.       1. Vomiting       MDM  Patient is given IV fluids and pain medications as well as Zofran. She's feeling much better with no further episodes of vomiting. She  states she's ready to go home. She states this feels like her other episodes in that there is nothing unusual about it. We'll discharge her to followup with her primary care physician as needed.        Rolan Bucco, MD 08/17/12 [phone]

## 2012-08-17 NOTE — ED Notes (Signed)
MD at bedside. 

## 2012-08-17 NOTE — ED Notes (Signed)
Pt was d/c'd from [facility] yesterday and developed nausea, vomiting and RUQ pain at 0200am.

## 2012-08-18 LAB — URINE CULTURE: Colony Count: 9000

## 2012-08-19 ENCOUNTER — Emergency Department (HOSPITAL_BASED_OUTPATIENT_CLINIC_OR_DEPARTMENT_OTHER)
Admission: EM | Admit: 2012-08-19 | Discharge: 2012-08-19 | Disposition: A | Discharge status: Home / Self Care | Payer: Self-pay | Attending: Emergency Medicine | Admitting: Emergency Medicine

## 2012-08-19 ENCOUNTER — Emergency Department (HOSPITAL_BASED_OUTPATIENT_CLINIC_OR_DEPARTMENT_OTHER): Admission: EM | Admit: 2012-08-19 | Discharge: 2012-08-19 | Discharge status: Against Medical Advice | Payer: Self-pay

## 2012-08-19 ENCOUNTER — Encounter (HOSPITAL_BASED_OUTPATIENT_CLINIC_OR_DEPARTMENT_OTHER): Payer: Self-pay | Admitting: Student

## 2012-08-19 DIAGNOSIS — R630 Anorexia: Secondary | ICD-10-CM | POA: Insufficient documentation

## 2012-08-19 DIAGNOSIS — F17200 Nicotine dependence, unspecified, uncomplicated: Secondary | ICD-10-CM | POA: Insufficient documentation

## 2012-08-19 DIAGNOSIS — G8929 Other chronic pain: Secondary | ICD-10-CM | POA: Insufficient documentation

## 2012-08-19 DIAGNOSIS — I10 Essential (primary) hypertension: Secondary | ICD-10-CM | POA: Insufficient documentation

## 2012-08-19 DIAGNOSIS — R1011 Right upper quadrant pain: Secondary | ICD-10-CM | POA: Insufficient documentation

## 2012-08-19 DIAGNOSIS — R112 Nausea with vomiting, unspecified: Secondary | ICD-10-CM | POA: Insufficient documentation

## 2012-08-19 DIAGNOSIS — Z79899 Other long term (current) drug therapy: Secondary | ICD-10-CM | POA: Insufficient documentation

## 2012-08-19 LAB — BASIC METABOLIC PANEL
BUN: 5 mg/dL — ABNORMAL LOW (ref 6–23)
Calcium: 9.9 mg/dL (ref 8.4–10.5)
GFR calc Af Amer: >90 mL/min (ref >90)
GFR calc non Af Amer: >90 mL/min (ref >90)
Glucose, Bld: 132 mg/dL — ABNORMAL HIGH (ref 70–99)
Sodium: 141 mEq/L (ref 135–145)

## 2012-08-19 MED ORDER — ONDANSETRON 4 MG PO TBDP: 4.0000 mg | ORAL_TABLET | Freq: Three times a day (TID) | ORAL | Status: DC | PRN

## 2012-08-19 MED ORDER — METOCLOPRAMIDE HCL 10 MG PO TABS: 10.0000 mg | ORAL_TABLET | Freq: Four times a day (QID) | ORAL | Status: DC

## 2012-08-19 MED ORDER — SODIUM CHLORIDE 0.9 % IV BOLUS (SEPSIS)
1500.0000 mL | Freq: Once | INTRAVENOUS | Status: AC
  Administered 2012-08-19: 1500 mL via INTRAVENOUS

## 2012-08-19 MED ORDER — ONDANSETRON HCL 4 MG/2ML IJ SOLN
4.0000 mg | INTRAMUSCULAR | Status: AC
  Administered 2012-08-19: 4 mg via INTRAVENOUS
  Filled 2012-08-19: qty 2

## 2012-08-19 MED ORDER — METOCLOPRAMIDE HCL 5 MG/ML IJ SOLN
10.0000 mg | Freq: Once | INTRAMUSCULAR | Status: AC
  Administered 2012-08-19: 10 mg via INTRAVENOUS
  Filled 2012-08-19: qty 2

## 2012-08-19 NOTE — ED Notes (Signed)
Pt requesting something else for nausea/vomiting, sts "I'm getting worse." MD notified.

## 2012-08-19 NOTE — ED Notes (Signed)
Pt in with c/o abdominal pain and vomiting. No active s/sx of vomiting at present time. Reports last episode at 1530

## 2012-08-19 NOTE — ED Notes (Signed)
Pt called RN to room, vomited on self/linen. Pt sts "tell the Doctor this isn't working, I'm so sick".

## 2012-08-19 NOTE — ED Provider Notes (Signed)
History     CSN: [id number]  Arrival date & time 08/19/12  1607   First MD Initiated Contact with Patient 08/19/12 1636      Chief Complaint  Patient presents with  . Abdominal Pain  . Emesis    (Consider location/radiation/quality/duration/timing/severity/associated sxs/prior treatment) HPI Comments: Theresa Carey presents for evaluation of abdominal discomfort and vomiting.  She states she had her gallbladder removed 13 years ago and has had intermittent discomfort since.  She reports vomiting since 1530 and states the oral zofran is not working.  She was seen in the ER on 12/24, twice on 12/21, 11/16, and 11/17 for similar discomfort.  She denies fever.  Patient is a 56 y.o. female presenting with abdominal pain and vomiting. The history is provided by the patient. No language interpreter was used.  Abdominal Pain The primary symptoms of the illness include abdominal pain, nausea and vomiting. The primary symptoms of the illness do not include fever, fatigue, shortness of breath, diarrhea, hematemesis, hematochezia, dysuria or vaginal discharge. The current episode started 3 to 5 hours ago. The onset of the illness was gradual. The problem has not changed since onset. The patient states that she believes she is currently not pregnant. The patient has not had a change in bowel habit. Risk factors for an acute abdominal problem include a history of abdominal surgery. Additional symptoms associated with the illness include anorexia. Symptoms associated with the illness do not include chills, heartburn, constipation, urgency, hematuria, frequency or back pain.  Emesis  Associated symptoms include abdominal pain. Pertinent negatives include no chills, no diarrhea and no fever.    Past Medical History  Diagnosis Date  . Hypertension   . Abdominal pain 2000    chronic abdominal pain with nausea and vomiting    Past Surgical History  Procedure Date  . Abdominal hysterectomy   .  Cholecystectomy     Family History  Problem Relation Age of Onset  . Hypertension Father   . Hypertension Mother   . Diabetes Maternal Grandmother   . Cancer - Other Maternal Grandmother     Pancreatic  . Cancer - Other Paternal Grandmother     Bone  . Cancer - Other Father     Bladder    History  Substance Use Topics  . Smoking status: Current Every Day Smoker -- 0.5 packs/day    Types: Cigarettes  . Smokeless tobacco: Never Used  . Alcohol Use: Yes     Comment: socially    OB History    Grav Para Term Preterm Abortions TAB SAB Ect Mult Living                  Review of Systems  Constitutional: Negative for fever, chills and fatigue.  Respiratory: Negative for shortness of breath.   Gastrointestinal: Positive for nausea, vomiting, abdominal pain and anorexia. Negative for heartburn, diarrhea, constipation, hematochezia and hematemesis.  Genitourinary: Negative for dysuria, urgency, frequency, hematuria and vaginal discharge.  Musculoskeletal: Negative for back pain.  All other systems reviewed and are negative.    Allergies  Avelox; Compazine; Ibuprofen; Morphine and related; and Promethazine hcl  Home Medications   Current Outpatient Rx  Name  Route  Sig  Dispense  Refill  . AMLODIPINE BESYLATE 10 MG PO TABS   Oral   Take 10 mg by mouth daily.          Marland Kitchen LISINOPRIL 20 MG PO TABS   Oral   Take 20 mg by mouth daily.          Marland Kitchen  OXYCODONE HCL 5 MG PO CAPS   Oral   Take 5 mg by mouth every 6 (six) hours as needed. For pain         . ZOLPIDEM TARTRATE 10 MG PO TABS   Oral   Take 10 mg by mouth at bedtime as needed. For sleep           BP 141/95  Pulse 110  Temp 98.7 F (37.1 C) (Oral)  Resp 20  Ht 5\' 5"  (1.651 m)  Wt 140 lb (63.504 kg)  BMI 23.30 kg/m2  SpO2 100%  Physical Exam  Nursing note and vitals reviewed. Constitutional: She is oriented to person, place, and time. She appears well-developed and well-nourished. No distress.    HENT:  Head: Normocephalic and atraumatic.  Right Ear: External ear normal.  Left Ear: External ear normal.  Nose: Nose normal.  Mouth/Throat: Oropharynx is clear and moist. No oropharyngeal exudate.  Eyes: Conjunctivae normal are normal. Pupils are equal, round, and reactive to light. Right eye exhibits no discharge. Left eye exhibits no discharge. No scleral icterus.  Neck: Normal range of motion. Neck supple. No JVD present. No tracheal deviation present.  Cardiovascular: Regular rhythm, S1 normal, S2 normal, normal heart sounds and intact distal pulses.   No extrasystoles are present. Tachycardia present.  Exam reveals no gallop and no friction rub.   No murmur heard. Pulmonary/Chest: Effort normal and breath sounds normal. No stridor. No respiratory distress. She has no wheezes. She has no rales. She exhibits no tenderness.  Abdominal: Soft. Normal appearance and bowel sounds are normal. She exhibits no shifting dullness, no distension, no pulsatile liver, no fluid wave, no abdominal bruit, no ascites, no pulsatile midline mass and no mass. There is no hepatosplenomegaly. There is tenderness in the epigastric area. There is no rebound, no guarding and no CVA tenderness. No hernia.  Musculoskeletal: Normal range of motion. She exhibits no edema and no tenderness.  Lymphadenopathy:    She has no cervical adenopathy.  Neurological: She is alert and oriented to person, place, and time. No cranial nerve deficit.  Skin: Skin is warm and dry. No rash noted. She is not diaphoretic. No erythema. No pallor.  Psychiatric: She has a normal mood and affect. Her behavior is normal.    ED Course  Procedures (including critical care time)   Labs Reviewed  BASIC METABOLIC PANEL   No results found.   No diagnosis found.    MDM  Pt presents for evaluation of abdominal discomfort and vomiting.  Note elevated HR, otherwise stable VS, NAD.  She has a 13 year history of similar discomfort and  recurrent nausea/vomiting.  Review of recent medical history reveals several ER visits and 1 admission within the last 10 days for treatment of the same.  Will have an IV placed.  Will bolus NS and administer reglan while awaiting results of a BNP.  She has had significant hypokalemia recently.  Will provide symptomatic care and reassess.  She currently only reports nausea and vomiting and denies pain.   1935.  Pt stable, NAD.  No vomiting over last 3 hours.  Note no renal failure or hypokalemia.  Will prescribe reglan and zofran.  Encouraged outpt follow-up.     Tobin Chad, MD 08/19/12 [phone]

## 2012-10-10 ENCOUNTER — Emergency Department (HOSPITAL_BASED_OUTPATIENT_CLINIC_OR_DEPARTMENT_OTHER)
Admission: EM | Admit: 2012-10-10 | Discharge: 2012-10-10 | Disposition: A | Discharge status: Home / Self Care | Payer: Self-pay | Attending: Emergency Medicine | Admitting: Emergency Medicine

## 2012-10-10 ENCOUNTER — Encounter (HOSPITAL_BASED_OUTPATIENT_CLINIC_OR_DEPARTMENT_OTHER): Payer: Self-pay | Admitting: *Deleted

## 2012-10-10 DIAGNOSIS — Z79899 Other long term (current) drug therapy: Secondary | ICD-10-CM | POA: Insufficient documentation

## 2012-10-10 DIAGNOSIS — I10 Essential (primary) hypertension: Secondary | ICD-10-CM | POA: Insufficient documentation

## 2012-10-10 DIAGNOSIS — G8929 Other chronic pain: Secondary | ICD-10-CM | POA: Insufficient documentation

## 2012-10-10 DIAGNOSIS — R1110 Vomiting, unspecified: Secondary | ICD-10-CM

## 2012-10-10 DIAGNOSIS — F17200 Nicotine dependence, unspecified, uncomplicated: Secondary | ICD-10-CM | POA: Insufficient documentation

## 2012-10-10 DIAGNOSIS — R109 Unspecified abdominal pain: Secondary | ICD-10-CM | POA: Insufficient documentation

## 2012-10-10 DIAGNOSIS — R630 Anorexia: Secondary | ICD-10-CM | POA: Insufficient documentation

## 2012-10-10 DIAGNOSIS — Z90710 Acquired absence of both cervix and uterus: Secondary | ICD-10-CM | POA: Insufficient documentation

## 2012-10-10 DIAGNOSIS — R1084 Generalized abdominal pain: Secondary | ICD-10-CM | POA: Insufficient documentation

## 2012-10-10 DIAGNOSIS — R112 Nausea with vomiting, unspecified: Secondary | ICD-10-CM | POA: Insufficient documentation

## 2012-10-10 DIAGNOSIS — Z9089 Acquired absence of other organs: Secondary | ICD-10-CM | POA: Insufficient documentation

## 2012-10-10 LAB — CBC WITH DIFFERENTIAL/PLATELET
Basophils Relative: 0 % (ref 0–1)
Eosinophils Absolute: 0 10*3/uL (ref 0.0–0.7)
MCH: 31.6 pg (ref 26.0–34.0)
MCHC: 33.9 g/dL (ref 30.0–36.0)
Neutro Abs: 11.7 10*3/uL — ABNORMAL HIGH (ref 1.7–7.7)
Neutrophils Relative %: 91 % — ABNORMAL HIGH (ref 43–77)
Platelets: 364 10*3/uL (ref 150–400)
RBC: 4.59 MIL/uL (ref 3.87–5.11)

## 2012-10-10 LAB — BASIC METABOLIC PANEL
BUN: 15 mg/dL (ref 6–23)
CO2: 24 mEq/L (ref 19–32)
Chloride: 102 mEq/L (ref 96–112)
Creatinine, Ser: 0.5 mg/dL (ref 0.50–1.10)
Glucose, Bld: 136 mg/dL — ABNORMAL HIGH (ref 70–99)

## 2012-10-10 LAB — COMPREHENSIVE METABOLIC PANEL
ALT: 15 U/L (ref 0–35)
AST: 14 U/L (ref 0–37)
Albumin: 4.6 g/dL (ref 3.5–5.2)
Alkaline Phosphatase: 133 U/L — ABNORMAL HIGH (ref 39–117)
Potassium: 3.5 mEq/L (ref 3.5–5.1)
Sodium: 142 mEq/L (ref 135–145)
Total Protein: 7.9 g/dL (ref 6.0–8.3)

## 2012-10-10 MED ORDER — SODIUM CHLORIDE 0.9 % IV SOLN
Freq: Once | INTRAVENOUS | Status: AC
  Administered 2012-10-10: 20:00:00 via INTRAVENOUS

## 2012-10-10 MED ORDER — HYDROMORPHONE HCL PF 1 MG/ML IJ SOLN
1.0000 mg | Freq: Once | INTRAMUSCULAR | Status: AC
  Administered 2012-10-10: 1 mg via INTRAVENOUS
  Filled 2012-10-10: qty 1

## 2012-10-10 MED ORDER — HYDROMORPHONE HCL PF 2 MG/ML IJ SOLN
2.0000 mg | Freq: Once | INTRAMUSCULAR | Status: AC
  Administered 2012-10-10: 2 mg via INTRAVENOUS
  Filled 2012-10-10: qty 1

## 2012-10-10 MED ORDER — ONDANSETRON 4 MG PO TBDP: 4.0000 mg | ORAL_TABLET | Freq: Three times a day (TID) | ORAL | Status: DC | PRN

## 2012-10-10 MED ORDER — ONDANSETRON HCL 4 MG/2ML IJ SOLN
4.0000 mg | Freq: Once | INTRAMUSCULAR | Status: AC
  Administered 2012-10-10: 4 mg via INTRAVENOUS
  Filled 2012-10-10: qty 2

## 2012-10-10 MED ORDER — SODIUM CHLORIDE 0.9 % IV BOLUS (SEPSIS)
1000.0000 mL | Freq: Once | INTRAVENOUS | Status: AC
  Administered 2012-10-10: 1000 mL via INTRAVENOUS

## 2012-10-10 MED ORDER — SODIUM CHLORIDE 0.9 % IV SOLN: INTRAVENOUS | Status: DC

## 2012-10-10 NOTE — ED Provider Notes (Signed)
History     CSN: [id number]  Arrival date & time 10/10/12  1511   First MD Initiated Contact with Patient 10/10/12 1907      Chief Complaint  Patient presents with  . Abdominal Pain    (Consider location/radiation/quality/duration/timing/severity/associated sxs/prior treatment) Patient is a 57 y.o. female presenting with abdominal pain. The history is provided by the patient. No language interpreter was used.  Abdominal Pain Pain location:  Generalized Pain quality: aching   Pain severity:  Severe Onset quality:  Gradual Timing:  Constant Progression:  Worsening Chronicity:  Recurrent Context: retching   Relieved by:  Nothing Associated symptoms: anorexia   Risk factors: no alcohol abuse    Pt complains of severe abdominal pain. Pt was seen here this am for the same.  Pt reports she has had these symptoms for 14 years.   Pt reports she has been seen at [facility] and followed by Dr. Matthias Hughs,  Past Medical History  Diagnosis Date  . Hypertension   . Abdominal pain 2000    chronic abdominal pain with nausea and vomiting    Past Surgical History  Procedure Laterality Date  . Abdominal hysterectomy    . Cholecystectomy      Family History  Problem Relation Age of Onset  . Hypertension Father   . Hypertension Mother   . Diabetes Maternal Grandmother   . Cancer - Other Maternal Grandmother     Pancreatic  . Cancer - Other Paternal Grandmother     Bone  . Cancer - Other Father     Bladder    History  Substance Use Topics  . Smoking status: Current Every Day Smoker -- 0.50 packs/day    Types: Cigarettes  . Smokeless tobacco: Never Used  . Alcohol Use: Yes     Comment: socially    OB History   Grav Para Term Preterm Abortions TAB SAB Ect Mult Living                  Review of Systems  Gastrointestinal: Positive for abdominal pain and anorexia.  All other systems reviewed and are negative.    Allergies  Avelox; Compazine; Ibuprofen; Morphine  and related; and Promethazine hcl  Home Medications   Current Outpatient Rx  Name  Route  Sig  Dispense  Refill  . amLODipine (NORVASC) 10 MG tablet   Oral   Take 10 mg by mouth daily.          Marland Kitchen lisinopril (PRINIVIL,ZESTRIL) 20 MG tablet   Oral   Take 20 mg by mouth daily.          . metoCLOPramide (REGLAN) 10 MG tablet   Oral   Take 1 tablet (10 mg total) by mouth every 6 (six) hours.   20 tablet   0   . ondansetron (ZOFRAN ODT) 4 MG disintegrating tablet   Oral   Take 1 tablet (4 mg total) by mouth every 8 (eight) hours as needed for nausea.   15 tablet   0   . ondansetron (ZOFRAN ODT) 4 MG disintegrating tablet   Oral   Take 1 tablet (4 mg total) by mouth every 8 (eight) hours as needed for nausea.   20 tablet   0   . oxycodone (OXY-IR) 5 MG capsule   Oral   Take 5 mg by mouth every 6 (six) hours as needed. For pain         . zolpidem (AMBIEN) 10 MG tablet  Oral   Take 10 mg by mouth at bedtime as needed. For sleep           BP 130/91  Pulse 124  Temp(Src) 100.1 F (37.8 C) (Oral)  Resp 20  Ht 5' 5.5" (1.664 m)  Wt 140 lb (63.504 kg)  BMI 22.93 kg/m2  SpO2 99%  Physical Exam  Nursing note and vitals reviewed. Constitutional: She is oriented to person, place, and time. She appears well-developed and well-nourished.  HENT:  Head: Normocephalic.  Right Ear: External ear normal.  Eyes: Pupils are equal, round, and reactive to light.  Neck: Normal range of motion. Neck supple.  Cardiovascular: Normal rate and normal heart sounds.   Pulmonary/Chest: Effort normal and breath sounds normal.  Abdominal: Soft. There is tenderness.  Musculoskeletal: Normal range of motion.  Neurological: She is alert and oriented to person, place, and time.  Skin: Skin is warm and dry.  Psychiatric: She has a normal mood and affect.    ED Course  Procedures (including critical care time)  Labs Reviewed  BASIC METABOLIC PANEL - Abnormal; Notable for the  following:    Potassium 3.3 (*)    Glucose, Bld 136 (*)    All other components within normal limits   No results found.   No diagnosis found.    MDM  Pt has had symptoms for 14 years,  I will check a bmet to make sure electolytes are normal.  I do not think pt needs any type of imaging.  Pt request 2 mg of dilaudid IV.  I reviewed records.  Pt advised to see her Md tomorrow.          Theresa Carey, [place] 10/10/12 2101

## 2012-10-10 NOTE — ED Provider Notes (Signed)
Medical screening examination/treatment/procedure(s) were performed by non-physician practitioner and as supervising physician I was immediately available for consultation/collaboration.  Ethelda Chick, MD 10/10/12 2118

## 2012-10-10 NOTE — ED Notes (Signed)
MD with pt  

## 2012-10-10 NOTE — ED Provider Notes (Signed)
History     CSN: [id number]  Arrival date & time 10/10/12  0422   First MD Initiated Contact with Patient 10/10/12 [phone]      Chief Complaint  Patient presents with  . Abdominal Pain    (Consider location/radiation/quality/duration/timing/severity/associated sxs/prior treatment) Patient is a 57 y.o. female presenting with abdominal pain. The history is provided by the patient.  Abdominal Pain Associated symptoms: nausea and vomiting   Associated symptoms: no chest pain, no diarrhea, no dysuria, no fever and no shortness of breath    patient with history of chronic abdominal pain nausea and vomiting. Today's episode onset was at 6 PM Saturday evening. Has vomited at least 20+ times. Abdominal pain is right upper quadrant. Patient sore he had her gallbladder removed. In the past patient's CT and acute abdominal series have been negative. Reviewed her previous visits. Patient's main complaint is the nausea and vomiting. Pain is described as an 8/10 ache to sharp very similar to her past episodes. nonradiating.  Past Medical History  Diagnosis Date  . Hypertension   . Abdominal pain 2000    chronic abdominal pain with nausea and vomiting    Past Surgical History  Procedure Laterality Date  . Abdominal hysterectomy    . Cholecystectomy      Family History  Problem Relation Age of Onset  . Hypertension Father   . Hypertension Mother   . Diabetes Maternal Grandmother   . Cancer - Other Maternal Grandmother     Pancreatic  . Cancer - Other Paternal Grandmother     Bone  . Cancer - Other Father     Bladder    History  Substance Use Topics  . Smoking status: Current Every Day Smoker -- 0.50 packs/day    Types: Cigarettes  . Smokeless tobacco: Never Used  . Alcohol Use: Yes     Comment: socially    OB History   Grav Para Term Preterm Abortions TAB SAB Ect Mult Living                  Review of Systems  Constitutional: Negative for fever.  HENT: Negative for  congestion.   Eyes: Negative for redness.  Respiratory: Negative for shortness of breath.   Cardiovascular: Negative for chest pain.  Gastrointestinal: Positive for nausea, vomiting and abdominal pain. Negative for diarrhea.  Genitourinary: Negative for dysuria.  Musculoskeletal: Negative for back pain.  Skin: Negative for rash.  Neurological: Negative for headaches.  Hematological: Does not bruise/bleed easily.    Allergies  Avelox; Compazine; Ibuprofen; Morphine and related; and Promethazine hcl  Home Medications   Current Outpatient Rx  Name  Route  Sig  Dispense  Refill  . amLODipine (NORVASC) 10 MG tablet   Oral   Take 10 mg by mouth daily.          Marland Kitchen lisinopril (PRINIVIL,ZESTRIL) 20 MG tablet   Oral   Take 20 mg by mouth daily.          . metoCLOPramide (REGLAN) 10 MG tablet   Oral   Take 1 tablet (10 mg total) by mouth every 6 (six) hours.   20 tablet   0   . ondansetron (ZOFRAN ODT) 4 MG disintegrating tablet   Oral   Take 1 tablet (4 mg total) by mouth every 8 (eight) hours as needed for nausea.   15 tablet   0   . ondansetron (ZOFRAN ODT) 4 MG disintegrating tablet   Oral   Take 1 tablet (4  mg total) by mouth every 8 (eight) hours as needed for nausea.   20 tablet   0   . oxycodone (OXY-IR) 5 MG capsule   Oral   Take 5 mg by mouth every 6 (six) hours as needed. For pain         . zolpidem (AMBIEN) 10 MG tablet   Oral   Take 10 mg by mouth at bedtime as needed. For sleep           BP 164/83  Pulse 81  Temp(Src) 98.8 F (37.1 C) (Oral)  SpO2 100%  Physical Exam  Nursing note and vitals reviewed. Constitutional: She is oriented to person, place, and time. She appears well-developed and well-nourished. No distress.  HENT:  Head: Normocephalic and atraumatic.  Mouth/Throat: Oropharynx is clear and moist.  Eyes: Conjunctivae and EOM are normal. Pupils are equal, round, and reactive to light.  Neck: Normal range of motion.   Cardiovascular: Normal rate, regular rhythm and normal heart sounds.   No murmur heard. Pulmonary/Chest: Effort normal and breath sounds normal. No respiratory distress. She has no rales.  Abdominal: Soft. Bowel sounds are normal. She exhibits no distension. There is no tenderness.  Musculoskeletal: Normal range of motion. She exhibits no edema.  Neurological: She is alert and oriented to person, place, and time. No cranial nerve deficit. She exhibits normal muscle tone. Coordination normal.  Skin: Skin is warm. No rash noted.    ED Course  Procedures (including critical care time)  Labs Reviewed  CBC WITH DIFFERENTIAL - Abnormal; Notable for the following:    WBC 12.9 (*)    Neutrophils Relative 91 (*)    Neutro Abs 11.7 (*)    Lymphocytes Relative 7 (*)    Monocytes Relative 2 (*)    All other components within normal limits  COMPREHENSIVE METABOLIC PANEL - Abnormal; Notable for the following:    Glucose, Bld 164 (*)    Alkaline Phosphatase 133 (*)    All other components within normal limits  LIPASE, BLOOD - Abnormal; Notable for the following:    Lipase 10 (*)    All other components within normal limits   No results found. Results for orders placed during the hospital encounter of 10/10/12  CBC WITH DIFFERENTIAL      Result Value Range   WBC 12.9 (*) 4.0 - 10.5 K/uL   RBC 4.59  3.87 - 5.11 MIL/uL   Hemoglobin 14.5  12.0 - 15.0 g/dL   HCT 16.1  09.6 - 04.5 %   MCV 93.2  78.0 - 100.0 fL   MCH 31.6  26.0 - 34.0 pg   MCHC 33.9  30.0 - 36.0 g/dL   RDW 40.9  81.1 - 91.4 %   Platelets 364  150 - 400 K/uL   Neutrophils Relative 91 (*) 43 - 77 %   Neutro Abs 11.7 (*) 1.7 - 7.7 K/uL   Lymphocytes Relative 7 (*) 12 - 46 %   Lymphs Abs 1.0  0.7 - 4.0 K/uL   Monocytes Relative 2 (*) 3 - 12 %   Monocytes Absolute 0.2  0.1 - 1.0 K/uL   Eosinophils Relative 0  0 - 5 %   Eosinophils Absolute 0.0  0.0 - 0.7 K/uL   Basophils Relative 0  0 - 1 %   Basophils Absolute 0.0  0.0 -  0.1 K/uL  COMPREHENSIVE METABOLIC PANEL      Result Value Range   Sodium 142  135 - 145 mEq/L  Potassium 3.5  3.5 - 5.1 mEq/L   Chloride 103  96 - 112 mEq/L   CO2 24  19 - 32 mEq/L   Glucose, Bld 164 (*) 70 - 99 mg/dL   BUN 14  6 - 23 mg/dL   Creatinine, Ser 4.09  0.50 - 1.10 mg/dL   Calcium 81.1  8.4 - 91.4 mg/dL   Total Protein 7.9  6.0 - 8.3 g/dL   Albumin 4.6  3.5 - 5.2 g/dL   AST 14  0 - 37 U/L   ALT 15  0 - 35 U/L   Alkaline Phosphatase 133 (*) 39 - 117 U/L   Total Bilirubin 0.3  0.3 - 1.2 mg/dL   GFR calc non Af Amer >90  >90 mL/min   GFR calc Af Amer >90  >90 mL/min  LIPASE, BLOOD      Result Value Range   Lipase 10 (*) 11 - 59 U/L     1. Vomiting   2. Chronic abdominal pain       MDM   Patient with onset of abdominal pain consistent with her chronic abdominal pain at 6 PM on Saturday associated with multiple episodes of vomiting no blood in the vomitus or diarrhea. Patient has had the x-rays of the abdomen and CTs of the abdomen in the past without any acute or significant findings. Abdomen soft nontender. Patient improved significantly with hydromorphone and Zofran in the ED also received 1 L of normal saline. Patient's abdomen is not consistent with an acute abdominal process. Patient's labs without any significant changes compared to old labs always has an elevated white blood cell count. Patient's electrolytes are not consistent with dehydration there function tests without significant abnormalities. Patient will be discharged with followup back to her primary care Dr.       Shelda Jakes, MD 10/10/12 [phone]

## 2012-10-10 NOTE — ED Notes (Signed)
Pt. C/o chronic right upper abdominal pain that started last PM. States pain has been going on for "14" years. Describes as stabbing. C/o N/v. Denies fevers. Denies any urinary symptoms.

## 2012-10-10 NOTE — ED Notes (Signed)
Pt. Requested to walk out. Ride is in the lobby. Drink given. States she feels "much better"

## 2012-10-10 NOTE — ED Notes (Addendum)
Per MD pt. Requested one more dose of pain medication and nausea medication prior to d/c home. States pain increased to 6/10.

## 2012-10-10 NOTE — ED Notes (Signed)
Pt seen here this a.m. For same and states she is no better.

## 2012-10-11 ENCOUNTER — Other Ambulatory Visit: Payer: Self-pay

## 2012-10-11 ENCOUNTER — Encounter (HOSPITAL_BASED_OUTPATIENT_CLINIC_OR_DEPARTMENT_OTHER): Payer: Self-pay | Admitting: *Deleted

## 2012-10-11 ENCOUNTER — Emergency Department (HOSPITAL_BASED_OUTPATIENT_CLINIC_OR_DEPARTMENT_OTHER)
Admission: EM | Admit: 2012-10-11 | Discharge: 2012-10-11 | Disposition: A | Discharge status: Home / Self Care | Payer: Self-pay | Attending: Emergency Medicine | Admitting: Emergency Medicine

## 2012-10-11 DIAGNOSIS — R1011 Right upper quadrant pain: Secondary | ICD-10-CM

## 2012-10-11 DIAGNOSIS — G8929 Other chronic pain: Secondary | ICD-10-CM | POA: Insufficient documentation

## 2012-10-11 DIAGNOSIS — R112 Nausea with vomiting, unspecified: Secondary | ICD-10-CM

## 2012-10-11 DIAGNOSIS — I10 Essential (primary) hypertension: Secondary | ICD-10-CM | POA: Insufficient documentation

## 2012-10-11 DIAGNOSIS — Z9089 Acquired absence of other organs: Secondary | ICD-10-CM | POA: Insufficient documentation

## 2012-10-11 DIAGNOSIS — Z79899 Other long term (current) drug therapy: Secondary | ICD-10-CM | POA: Insufficient documentation

## 2012-10-11 DIAGNOSIS — F17200 Nicotine dependence, unspecified, uncomplicated: Secondary | ICD-10-CM | POA: Insufficient documentation

## 2012-10-11 LAB — URINE MICROSCOPIC-ADD ON

## 2012-10-11 LAB — URINALYSIS, ROUTINE W REFLEX MICROSCOPIC
Bilirubin Urine: NEGATIVE
Leukocytes, UA: NEGATIVE
Nitrite: NEGATIVE
Specific Gravity, Urine: 1.017 (ref 1.005–1.030)
Urobilinogen, UA: 1 mg/dL (ref 0.0–1.0)
pH: 8 (ref 5.0–8.0)

## 2012-10-11 MED ORDER — ONDANSETRON 4 MG PO TBDP
4.0000 mg | ORAL_TABLET | Freq: Once | ORAL | Status: AC
  Administered 2012-10-11: 4 mg via ORAL
  Filled 2012-10-11: qty 1

## 2012-10-11 NOTE — ED Provider Notes (Signed)
I have personally seen and examined the patient.  I have discussed the plan of care with the resident.  I have reviewed the documentation on PMH/FH/Soc. History.  I have reviewed the documentation of the resident and agree.  I have reviewed and agree with the ECG interpretation(s) documented by the resident.  I reviewed narcotic database.  Pt has pain meds at home.  She is in no distress.  She admits this is chronic.  Needs f/u as outpatient for chronic pain  Joya Gaskins, MD 10/11/12 1451

## 2012-10-11 NOTE — ED Notes (Signed)
Right upper quad pain. Pt states pain is chronic. Nausea and vomting

## 2012-10-11 NOTE — ED Provider Notes (Signed)
History     CSN: [id number]  Arrival date & time 10/11/12  1301   First MD Initiated Contact with Patient 10/11/12 1313      Chief Complaint  Patient presents with  . Abdominal Pain    (Consider location/radiation/quality/duration/timing/severity/associated sxs/prior treatment) HPI Comments: 57 y.o PMH HTN,chronic ab pain since gallbladder removal in 2000.  She was seen 2/16 for RUQ ab pain given Dilaudid and labs were collected.  She reports RUQ pain stabbing 8/10 without radiation, nausea, bilious vomiting since Saturday at 6 PM.  She has not tried any medication for nausea or pain orally due to decreased ability to tolerate po.  Pain is intermittent and happens q2 months now it used to happen monthly since the surgery.  She has followed with [facility], [facility], and Dr. Matthias Hughs, Dr. Loreta Ave in the past with no recent f/u due to her not having insurance.    SH: PCP-Dr. Bufford Spikes, no insurance, works as Public house manager for [employer]     Patient is a 57 y.o. female presenting with abdominal pain. The history is provided by the patient. No language interpreter was used.  Abdominal Pain Pain location:  RUQ Pain quality: stabbing   Pain radiates to:  Does not radiate Duration:  2 days Chronicity:  Chronic Relieved by:  None tried Associated symptoms: nausea and vomiting   Associated symptoms: no chest pain, no dysuria, no fever and no shortness of breath   Associated symptoms comment:  Bilious vomiting    Past Medical History  Diagnosis Date  . Hypertension   . Abdominal pain 2000    chronic abdominal pain with nausea and vomiting    Past Surgical History  Procedure Laterality Date  . Abdominal hysterectomy    . Cholecystectomy      Family History  Problem Relation Age of Onset  . Hypertension Father   . Hypertension Mother   . Diabetes Maternal Grandmother   . Cancer - Other Maternal Grandmother     Pancreatic  . Cancer - Other Paternal Grandmother     Bone  . Cancer - Other  Father     Bladder    History  Substance Use Topics  . Smoking status: Current Every Day Smoker -- 0.50 packs/day    Types: Cigarettes  . Smokeless tobacco: Never Used  . Alcohol Use: Yes     Comment: socially    OB History   Grav Para Term Preterm Abortions TAB SAB Ect Mult Living                  Review of Systems  Constitutional: Negative for fever.  Respiratory: Negative for shortness of breath.   Cardiovascular: Negative for chest pain.  Gastrointestinal: Positive for nausea, vomiting and abdominal pain.  Genitourinary: Negative for dysuria.  All other systems reviewed and are negative.    Allergies  Avelox; Compazine; Ibuprofen; Morphine and related; and Promethazine hcl  Home Medications   Current Outpatient Rx  Name  Route  Sig  Dispense  Refill  . amLODipine (NORVASC) 10 MG tablet   Oral   Take 10 mg by mouth daily.          Marland Kitchen lisinopril (PRINIVIL,ZESTRIL) 20 MG tablet   Oral   Take 20 mg by mouth daily.          . metoCLOPramide (REGLAN) 10 MG tablet   Oral   Take 1 tablet (10 mg total) by mouth every 6 (six) hours.   20 tablet  0   . ondansetron (ZOFRAN ODT) 4 MG disintegrating tablet   Oral   Take 1 tablet (4 mg total) by mouth every 8 (eight) hours as needed for nausea.   15 tablet   0   . ondansetron (ZOFRAN ODT) 4 MG disintegrating tablet   Oral   Take 1 tablet (4 mg total) by mouth every 8 (eight) hours as needed for nausea.   20 tablet   0   . oxycodone (OXY-IR) 5 MG capsule   Oral   Take 5 mg by mouth every 6 (six) hours as needed. For pain         . zolpidem (AMBIEN) 10 MG tablet   Oral   Take 10 mg by mouth at bedtime as needed. For sleep           BP 146/100  Pulse 97  Temp(Src) 99.6 F (37.6 C) (Oral)  Resp 18  Ht 5\' 5"  (1.651 m)  Wt 140 lb (63.504 kg)  BMI 23.3 kg/m2  SpO2 100%  Physical Exam  Nursing note and vitals reviewed. Constitutional: She is oriented to person, place, and time. She appears  well-developed and well-nourished. She is cooperative. No distress.  HENT:  Head: Normocephalic and atraumatic.  Mouth/Throat: Oropharynx is clear and moist and mucous membranes are normal. No oropharyngeal exudate.  Eyes: Conjunctivae are normal. Pupils are equal, round, and reactive to light. Right eye exhibits no discharge. Left eye exhibits no discharge. No scleral icterus.  Cardiovascular: Normal rate, regular rhythm, S1 normal, S2 normal and normal heart sounds.   No murmur heard. Pulmonary/Chest: Effort normal and breath sounds normal. No respiratory distress. She has no wheezes.  Abdominal: Soft. Bowel sounds are normal. She exhibits no distension. There is tenderness. There is no rebound and no guarding.  Mild ttp RUQ  Musculoskeletal: She exhibits no edema.  Neurological: She is alert and oriented to person, place, and time.  Skin: Skin is warm, dry and intact. No rash noted. She is not diaphoretic.  Psychiatric: She has a normal mood and affect. Her speech is normal and behavior is normal. Judgment and thought content normal. Cognition and memory are normal.    ED Course  Procedures (including critical care time)  Labs Reviewed  URINALYSIS, ROUTINE W REFLEX MICROSCOPIC - Abnormal; Notable for the following:    APPearance TURBID (*)    Hgb urine dipstick SMALL (*)    Ketones, ur 15 (*)    All other components within normal limits  URINE MICROSCOPIC-ADD ON - Abnormal; Notable for the following:    Squamous Epithelial / LPF FEW (*)    Bacteria, UA FEW (*)    All other components within normal limits   No results found.   1. RUQ abdominal pain, chronic   2. Nausea & vomiting      Date: 10/11/2012  Rate: 76  Rhythm: normal sinus rhythm  QRS Axis: normal  Intervals: normal  ST/T Wave abnormalities: normal  Conduction Disutrbances:none  Narrative Interpretation:   Old EKG Reviewed: unchanged    MDM  Zofran ODT EKG She has oral pain medication at home (i.e  Oxycodone) F/u with PCP in 1 week   Shirlee Latch MD [phone]         Annett Gula, MD 10/11/12 1348

## 2012-10-26 ENCOUNTER — Ambulatory Visit: Payer: Self-pay | Admitting: Emergency Medicine

## 2012-10-26 VITALS — BP 130/84 | HR 110 | Temp 99.7°F | Resp 16 | Ht 65.5 in | Wt 141.0 lb

## 2012-10-26 DIAGNOSIS — IMO0002 Reserved for concepts with insufficient information to code with codable children: Secondary | ICD-10-CM

## 2012-10-26 DIAGNOSIS — L02419 Cutaneous abscess of limb, unspecified: Secondary | ICD-10-CM

## 2012-10-26 MED ORDER — SULFAMETHOXAZOLE-TRIMETHOPRIM 800-160 MG PO TABS: 1.0000 | ORAL_TABLET | Freq: Two times a day (BID) | ORAL | Status: DC

## 2012-10-26 NOTE — Progress Notes (Signed)
[facility] [address] [place] [address] [phone]  Date:  10/26/2012   Name:  Theresa Carey   DOB:  09/09/1955   MRN:  [id number]  PCP:  No primary [name] on file.    Chief Complaint: Abscess   History of Present Illness:  Theresa Carey is a 57 y.o. very pleasant female patient who presents with the following:  Four day history of painful mass in right antecubital space.  Now draining no fever or chills.  Denies injury.  Denied IV drug administration.  Denies other complaint or health concern today.   Patient Active Problem List  Diagnosis  . Intractable nausea and vomiting  . Hypokalemia  . Dehydration  . Chronic RUQ pain  . Tobacco use disorder    Past Medical History  Diagnosis Date  . Hypertension   . Abdominal pain 2000    chronic abdominal pain with nausea and vomiting  . Allergy     Past Surgical History  Procedure Laterality Date  . Abdominal hysterectomy    . Cholecystectomy      History  Substance Use Topics  . Smoking status: Current Every Day Smoker -- 0.50 packs/day    Types: Cigarettes  . Smokeless tobacco: Never Used  . Alcohol Use: Yes     Comment: socially    Family History  Problem Relation Age of Onset  . Hypertension Father   . Cancer - Other Father     Bladder  . Hypertension Mother   . Diabetes Maternal Grandmother   . Cancer - Other Maternal Grandmother     Pancreatic  . Cancer - Other Paternal Grandmother     Bone    Allergies  Allergen Reactions  . Avelox (Moxifloxacin Hcl In Nacl)     Low blood pressure  . Compazine (Prochlorperazine Edisylate) Other (See Comments)    Caused lockjaw  . Ibuprofen Hives  . Morphine And Related Nausea And Vomiting  . Promethazine Hcl Nausea And Vomiting    Medication list has been reviewed and updated.  Current Outpatient Prescriptions on File Prior to Visit  Medication Sig Dispense Refill  . amLODipine (NORVASC) 10 MG tablet Take 10 mg by mouth  daily.       Marland Kitchen lisinopril (PRINIVIL,ZESTRIL) 20 MG tablet Take 20 mg by mouth daily.       Marland Kitchen oxycodone (OXY-IR) 5 MG capsule Take 5 mg by mouth every 6 (six) hours as needed. For pain      . zolpidem (AMBIEN) 10 MG tablet Take 10 mg by mouth at bedtime as needed. For sleep      . metoCLOPramide (REGLAN) 10 MG tablet Take 1 tablet (10 mg total) by mouth every 6 (six) hours.  20 tablet  0  . ondansetron (ZOFRAN ODT) 4 MG disintegrating tablet Take 1 tablet (4 mg total) by mouth every 8 (eight) hours as needed for nausea.  15 tablet  0  . ondansetron (ZOFRAN ODT) 4 MG disintegrating tablet Take 1 tablet (4 mg total) by mouth every 8 (eight) hours as needed for nausea.  20 tablet  0   No current facility-administered medications on file prior to visit.    Review of Systems:  As per HPI, otherwise negative.    Physical Examination: Filed Vitals:   10/26/12 1407  BP: 130/84  Pulse: 110  Temp: 99.7 F (37.6 C)  Resp: 16   Filed Vitals:   10/26/12 1407  Height: 5' 5.5" (1.664 m)  Weight: 141  lb (63.957 kg)   Body mass index is 23.1 kg/(m^2). Ideal Body Weight: Weight in (lb) to have BMI = 25: 152.2   GEN: WDWN, NAD, Non-toxic, Alert & Oriented x 3 HEENT: Atraumatic, Normocephalic.  Ears and Nose: No external deformity. EXTR: No clubbing/cyanosis/edema NEURO: Normal gait.  PSYCH: Normally interactive. Conversant. Not depressed or anxious appearing.  Calm demeanor.  RIGHT ARM:  Abscess right antecubital space No track marks on either arm  Assessment and Plan: Abscess elbow. I&D Follow up two days Septra ds  Carmelina Dane, MD

## 2012-10-26 NOTE — Patient Instructions (Addendum)
Abscess An abscess is an infected area that contains a collection of pus and debris. It can occur in almost any part of the body. An abscess is also known as a furuncle or boil. CAUSES   An abscess occurs when tissue gets infected. This can occur from blockage of oil or sweat glands, infection of hair follicles, or a minor injury to the skin. As the body tries to fight the infection, pus collects in the area and creates pressure under the skin. This pressure causes pain. People with weakened immune systems have difficulty fighting infections and get certain abscesses more often.   SYMPTOMS Usually an abscess develops on the skin and becomes a painful mass that is red, warm, and tender. If the abscess forms under the skin, you may feel a moveable soft area under the skin. Some abscesses break open (rupture) on their own, but most will continue to get worse without care. The infection can spread deeper into the body and eventually into the bloodstream, causing you to feel ill.   DIAGNOSIS   Your caregiver will take your medical history and perform a physical exam. A sample of fluid may also be taken from the abscess to determine what is causing your infection. TREATMENT   Your caregiver may prescribe antibiotic medicines to fight the infection. However, taking antibiotics alone usually does not cure an abscess. Your caregiver may need to make a small cut (incision) in the abscess to drain the pus. In some cases, gauze is packed into the abscess to reduce pain and to continue draining the area. HOME CARE INSTRUCTIONS    Only take over-the-counter or prescription medicines for pain, discomfort, or fever as directed by your caregiver.   If you were prescribed antibiotics, take them as directed. Finish them even if you start to feel better.   If gauze is used, follow your caregiver's directions for changing the gauze.   To avoid spreading the infection:   Keep your draining abscess covered with a  bandage.   Wash your hands well.   Do not share personal care items, towels, or whirlpools with others.   Avoid skin contact with others.   Keep your skin and clothes clean around the abscess.   Keep all follow-up appointments as directed by your caregiver.  SEEK MEDICAL CARE IF:    You have increased pain, swelling, redness, fluid drainage, or bleeding.   You have muscle aches, chills, or a general ill feeling.   You have a fever.  MAKE SURE YOU:    Understand these instructions.   Will watch your condition.   Will get help right away if you are not doing well or get worse.  Document Released: 05/21/2005 Document Revised: 02/10/2012 Document Reviewed: 10/24/2011 [employer] Patient Information 2013 [employer].    

## 2012-10-27 ENCOUNTER — Encounter: Payer: Self-pay | Admitting: Physician Assistant

## 2012-10-28 ENCOUNTER — Ambulatory Visit (INDEPENDENT_AMBULATORY_CARE_PROVIDER_SITE_OTHER): Payer: Self-pay | Admitting: Physician Assistant

## 2012-10-28 VITALS — BP 128/84 | HR 86 | Temp 98.5°F | Resp 16 | Ht 65.0 in | Wt 141.0 lb

## 2012-10-28 DIAGNOSIS — IMO0002 Reserved for concepts with insufficient information to code with codable children: Secondary | ICD-10-CM

## 2012-10-28 NOTE — Progress Notes (Signed)
Patient ID: Theresa Carey MRN: [id number], DOB: 15-Apr-1956 57 y.o. Date of Encounter: 10/28/2012, 4:22 PM  Chief Complaint: Wound care   See previous note  HPI: 57 y.o. y/o female presents for wound care s/p I&D on 10/26/12. Doing well No issues or complaints.  Afebrile/ no chills No nausea or vomiting Tolerating Bactrim. Pain improved but still has significant discomfort of affected area.  Daily dressing change Previous note reviewed  Past Medical History  Diagnosis Date  . Hypertension   . Abdominal pain 2000    chronic abdominal pain with nausea and vomiting  . Allergy      Home Meds: Prior to Admission medications   Medication Sig Start Date End Date Taking? Authorizing Provider  amLODipine (NORVASC) 10 MG tablet Take 10 mg by mouth daily.    Yes Historical Provider, MD  lisinopril (PRINIVIL,ZESTRIL) 20 MG tablet Take 20 mg by mouth daily.    Yes Historical Provider, MD  metoCLOPramide (REGLAN) 10 MG tablet Take 1 tablet (10 mg total) by mouth every 6 (six) hours. 08/19/12  Yes Tobin Chad, MD  ondansetron (ZOFRAN ODT) 4 MG disintegrating tablet Take 1 tablet (4 mg total) by mouth every 8 (eight) hours as needed for nausea. 08/19/12  Yes Tobin Chad, MD  ondansetron (ZOFRAN ODT) 4 MG disintegrating tablet Take 1 tablet (4 mg total) by mouth every 8 (eight) hours as needed for nausea. 10/10/12  Yes Shelda Jakes, MD  oxycodone (OXY-IR) 5 MG capsule Take 5 mg by mouth every 6 (six) hours as needed. For pain   Yes Historical Provider, MD  sulfamethoxazole-trimethoprim (BACTRIM DS,SEPTRA DS) 800-160 MG per tablet Take 1 tablet by mouth 2 (two) times daily. 10/26/12  Yes Phillips Odor, MD  zolpidem (AMBIEN) 10 MG tablet Take 10 mg by mouth at bedtime as needed. For sleep   Yes Historical Provider, MD    Allergies:  Allergies  Allergen Reactions  . Avelox (Moxifloxacin Hcl In Nacl)     Low blood pressure  . Compazine (Prochlorperazine Edisylate) Other (See  Comments)    Caused lockjaw  . Ibuprofen Hives  . Morphine And Related Nausea And Vomiting  . Promethazine Hcl Nausea And Vomiting    ROS: Constitutional: Afebrile, no chills Cardiovascular: negative for chest pain or palpitations Dermatological: Positive for wound. Negative for erythema or warmth.  GI: No nausea or vomiting   EXAM: Physical Exam: Blood pressure 128/84, pulse 86, temperature 98.5 F (36.9 C), temperature source Oral, resp. rate 16, height 5\' 5"  (1.651 m), weight 141 lb (63.957 kg), SpO2 98.00%., Body mass index is 23.46 kg/(m^2). General: Well developed, well nourished, in no acute distress. Nontoxic appearing. Head: Normocephalic, atraumatic, sclera non-icteric.  Neck: Supple. Lungs: Breathing is unlabored. Heart: Normal rate. Skin:  Warm and moist. Dressing and packing in place. No induration or erythema. +tenderness to palpation. Neuro: Alert and oriented X 3. Moves all extremities spontaneously. Normal gait.  Psych:  Responds to questions appropriately with a normal affect.       PROCEDURE: Dressing and packing removed. Minimal purulence expressed Wound bed healthy Irrigated with 1% plain lidocaine 5 cc. Repacked with 1/4 plain packing.  Dressing applied  LAB: Culture: no culture obtained.   A/P: 57 y.o. y/o female with arm cellulitis/abscess as above s/p I&D on 10/26/12.  Wound care per above Continue Bactrim.  Pain improved. Patient did ask about pain medication but I instructed her to use warm compresses and that the pain should be significantly improved over the  next 48 hours.  Daily dressing changes Recheck 48 hours  Signed, Rhoderick Moody, PA-C 10/28/2012 4:22 PM

## 2012-10-30 ENCOUNTER — Ambulatory Visit (INDEPENDENT_AMBULATORY_CARE_PROVIDER_SITE_OTHER): Payer: Self-pay | Admitting: Physician Assistant

## 2012-10-30 VITALS — BP 98/70 | HR 80 | Temp 98.0°F | Resp 18

## 2012-10-30 DIAGNOSIS — IMO0002 Reserved for concepts with insufficient information to code with codable children: Secondary | ICD-10-CM
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
## 2012-10-30 NOTE — Patient Instructions (Signed)
Wash the wound with soap and water daily.  Keep covered when you are working, or any activity where it may become dirty/contaminated.  Continue the antibiotics until your supply is gone.  Apply a warm compress to the area for 15-20 minutes, 3-4 times daily as you are able.  Stay well hydrated. Return if your symptoms worsen or persist.

## 2012-11-11 ENCOUNTER — Other Ambulatory Visit: Payer: Self-pay | Admitting: *Deleted

## 2012-11-11 MED ORDER — ZOLPIDEM TARTRATE 10 MG PO TABS: 10.0000 mg | ORAL_TABLET | Freq: Every evening | ORAL | Status: DC | PRN

## 2012-11-16 ENCOUNTER — Other Ambulatory Visit: Payer: Self-pay | Admitting: Geriatric Medicine

## 2012-11-16 MED ORDER — ZOLPIDEM TARTRATE 10 MG PO TABS: 10.0000 mg | ORAL_TABLET | Freq: Every evening | ORAL | Status: DC | PRN

## 2012-11-17 ENCOUNTER — Other Ambulatory Visit: Payer: Self-pay | Admitting: Geriatric Medicine

## 2012-11-17 MED ORDER — ZOLPIDEM TARTRATE 10 MG PO TABS: 10.0000 mg | ORAL_TABLET | Freq: Every evening | ORAL | Status: DC | PRN

## 2012-11-18 ENCOUNTER — Other Ambulatory Visit: Payer: Self-pay | Admitting: Geriatric Medicine

## 2012-11-18 MED ORDER — OXYCODONE HCL ER 10 MG PO T12A: 10.0000 mg | EXTENDED_RELEASE_TABLET | Freq: Two times a day (BID) | ORAL | Status: DC

## 2012-12-16 ENCOUNTER — Other Ambulatory Visit: Payer: Self-pay | Admitting: *Deleted

## 2012-12-16 MED ORDER — ZOLPIDEM TARTRATE 10 MG PO TABS: 10.0000 mg | ORAL_TABLET | Freq: Every evening | ORAL | Status: DC | PRN

## 2012-12-16 MED ORDER — AMLODIPINE BESYLATE 10 MG PO TABS: ORAL_TABLET | ORAL | Status: DC

## 2012-12-16 MED ORDER — OXYCODONE HCL ER 10 MG PO T12A: 10.0000 mg | EXTENDED_RELEASE_TABLET | Freq: Two times a day (BID) | ORAL | Status: DC

## 2012-12-16 NOTE — Addendum Note (Signed)
Addended by: Buena Irish A on: 12/16/2012 03:23 PM   Modules accepted: Orders

## 2012-12-29 ENCOUNTER — Other Ambulatory Visit: Payer: Self-pay | Admitting: Geriatric Medicine

## 2012-12-29 MED ORDER — LORAZEPAM 0.5 MG PO TABS: 0.5000 mg | ORAL_TABLET | Freq: Four times a day (QID) | ORAL | Status: DC | PRN

## 2013-01-14 ENCOUNTER — Other Ambulatory Visit: Payer: Self-pay | Admitting: *Deleted

## 2013-01-14 MED ORDER — OXYCODONE HCL ER 10 MG PO T12A: EXTENDED_RELEASE_TABLET | ORAL | Status: DC

## 2013-02-10 ENCOUNTER — Other Ambulatory Visit: Payer: Self-pay | Admitting: Geriatric Medicine

## 2013-02-11 ENCOUNTER — Other Ambulatory Visit: Payer: Self-pay | Admitting: Geriatric Medicine

## 2013-02-11 MED ORDER — OXYCODONE HCL ER 10 MG PO T12A: EXTENDED_RELEASE_TABLET | ORAL | Status: DC

## 2013-03-10 ENCOUNTER — Other Ambulatory Visit: Payer: Self-pay | Admitting: Geriatric Medicine

## 2013-03-10 MED ORDER — LISINOPRIL 20 MG PO TABS: 20.0000 mg | ORAL_TABLET | Freq: Every day | ORAL | Status: DC

## 2013-03-10 MED ORDER — OXYCODONE HCL ER 10 MG PO T12A: EXTENDED_RELEASE_TABLET | ORAL | Status: DC

## 2013-04-07 ENCOUNTER — Other Ambulatory Visit: Payer: Self-pay | Admitting: Geriatric Medicine

## 2013-04-08 ENCOUNTER — Other Ambulatory Visit: Payer: Self-pay | Admitting: *Deleted

## 2013-04-08 MED ORDER — OXYCODONE HCL ER 10 MG PO T12A: EXTENDED_RELEASE_TABLET | ORAL | Status: DC

## 2013-04-14 ENCOUNTER — Other Ambulatory Visit: Payer: Self-pay | Admitting: Geriatric Medicine

## 2013-04-19 ENCOUNTER — Other Ambulatory Visit: Payer: Self-pay | Admitting: *Deleted

## 2013-04-19 MED ORDER — SIMVASTATIN 10 MG PO TABS: 10.0000 mg | ORAL_TABLET | Freq: Every day | ORAL | Status: DC

## 2013-04-20 ENCOUNTER — Other Ambulatory Visit: Payer: Self-pay | Admitting: *Deleted

## 2013-05-05 ENCOUNTER — Other Ambulatory Visit: Payer: Self-pay | Admitting: *Deleted

## 2013-05-05 MED ORDER — OXYCODONE HCL ER 10 MG PO T12A: EXTENDED_RELEASE_TABLET | ORAL | Status: DC

## 2013-05-10 ENCOUNTER — Other Ambulatory Visit: Payer: Self-pay | Admitting: *Deleted

## 2013-05-10 ENCOUNTER — Other Ambulatory Visit: Payer: Self-pay | Admitting: Internal Medicine

## 2013-05-10 MED ORDER — LORAZEPAM 0.5 MG PO TABS: ORAL_TABLET | ORAL | Status: DC

## 2013-06-02 ENCOUNTER — Other Ambulatory Visit: Payer: Self-pay | Admitting: *Deleted

## 2013-06-02 MED ORDER — OXYCODONE HCL ER 10 MG PO T12A: EXTENDED_RELEASE_TABLET | ORAL | Status: DC

## 2013-06-17 ENCOUNTER — Other Ambulatory Visit: Payer: Self-pay | Admitting: *Deleted

## 2013-06-17 MED ORDER — AMLODIPINE BESYLATE 10 MG PO TABS: ORAL_TABLET | ORAL | Status: DC

## 2013-06-30 ENCOUNTER — Other Ambulatory Visit: Payer: Self-pay | Admitting: *Deleted

## 2013-06-30 MED ORDER — OXYCODONE HCL ER 10 MG PO T12A: EXTENDED_RELEASE_TABLET | ORAL | Status: DC

## 2013-07-08 ENCOUNTER — Other Ambulatory Visit: Payer: Self-pay | Admitting: *Deleted

## 2013-07-08 MED ORDER — AMLODIPINE BESYLATE 10 MG PO TABS: ORAL_TABLET | ORAL | Status: DC

## 2013-07-18 ENCOUNTER — Other Ambulatory Visit: Payer: Self-pay | Admitting: *Deleted

## 2013-07-28 ENCOUNTER — Other Ambulatory Visit: Payer: Self-pay | Admitting: *Deleted

## 2013-07-28 MED ORDER — OXYCODONE HCL ER 10 MG PO T12A: EXTENDED_RELEASE_TABLET | ORAL | Status: DC

## 2013-08-08 ENCOUNTER — Other Ambulatory Visit: Payer: Self-pay | Admitting: *Deleted

## 2013-08-08 MED ORDER — LISINOPRIL 20 MG PO TABS: 20.0000 mg | ORAL_TABLET | Freq: Every day | ORAL | Status: DC

## 2013-08-16 ENCOUNTER — Other Ambulatory Visit: Payer: Self-pay | Admitting: Internal Medicine

## 2013-08-16 ENCOUNTER — Other Ambulatory Visit: Payer: Self-pay | Admitting: *Deleted

## 2013-08-16 NOTE — Progress Notes (Signed)
Called pharmacy to confirm medication. Pharmacist stated that patient knew that the pharmacy gave her Immediate release  instead of extended release and patient called after leaving with it and stated she wanted her regular medication instead and they told her to bring the other back and she stated she had already taken some of it. The pharmacy told her to call here and get another Rx and they would refill it.  I tried calling patient and got her voicemail. LMOM to return call.

## 2013-08-16 NOTE — Progress Notes (Signed)
Received a call from [facility] indicating that the patient was given oxycontin instead of her usual oxycodone 5mg  IR po q 6 hrs prn pain.  She has already taken one of the tablets of the incorrect medication.  It appears this is because this is what we sent to the pharmacy.  Please ask her to return the incorrect medicine to the pharmacy and send a 30 day supply of the proper medication.  I also will not continue filling her pain medicine when she sees other physicians for all other concerns.

## 2013-08-24 ENCOUNTER — Other Ambulatory Visit: Payer: Self-pay | Admitting: *Deleted

## 2013-08-24 MED ORDER — SIMVASTATIN 10 MG PO TABS: 10.0000 mg | ORAL_TABLET | Freq: Every day | ORAL | Status: DC

## 2013-08-24 MED ORDER — ZOLPIDEM TARTRATE 10 MG PO TABS: 10.0000 mg | ORAL_TABLET | Freq: Every evening | ORAL | Status: DC | PRN

## 2013-08-24 NOTE — Progress Notes (Signed)
Patient Notified

## 2013-08-29 ENCOUNTER — Other Ambulatory Visit: Payer: Self-pay | Admitting: *Deleted

## 2013-08-29 MED ORDER — OXYCODONE HCL 5 MG PO TABS: ORAL_TABLET | ORAL | Status: DC

## 2013-08-29 MED ORDER — OXYCODONE HCL ER 10 MG PO T12A: EXTENDED_RELEASE_TABLET | ORAL | Status: DC

## 2013-09-01 ENCOUNTER — Encounter: Payer: Self-pay | Admitting: Internal Medicine

## 2013-09-01 ENCOUNTER — Ambulatory Visit (INDEPENDENT_AMBULATORY_CARE_PROVIDER_SITE_OTHER): Payer: Self-pay | Admitting: Internal Medicine

## 2013-09-01 VITALS — BP 144/86 | HR 102 | Temp 97.2°F | Resp 14 | Wt 148.8 lb

## 2013-09-01 DIAGNOSIS — F17200 Nicotine dependence, unspecified, uncomplicated: Secondary | ICD-10-CM

## 2013-09-01 DIAGNOSIS — G8929 Other chronic pain: Secondary | ICD-10-CM

## 2013-09-01 DIAGNOSIS — B86 Scabies: Secondary | ICD-10-CM

## 2013-09-01 DIAGNOSIS — F411 Generalized anxiety disorder: Secondary | ICD-10-CM

## 2013-09-01 DIAGNOSIS — R1011 Right upper quadrant pain: Secondary | ICD-10-CM

## 2013-09-01 MED ORDER — LORAZEPAM 0.5 MG PO TABS: ORAL_TABLET | ORAL | Status: DC

## 2013-09-01 MED ORDER — PERMETHRIN 5 % EX CREA: 1.0000 "application " | TOPICAL_CREAM | Freq: Once | CUTANEOUS | Status: DC

## 2013-09-01 NOTE — Patient Instructions (Signed)
Use permethrin cream once today and repeat in 1 week.   Wash all of your clothes and bedding and towels in hot water.

## 2013-09-01 NOTE — Progress Notes (Signed)
Patient ID: Theresa Carey, female   DOB: 05/25/1956, 58 y.o.   MRN: [id number]   Location:  [facility]  Allergies  Allergen Reactions  . Avelox [Moxifloxacin Hcl In Nacl]     Low blood pressure  . Compazine [Prochlorperazine Edisylate] Other (See Comments)    Caused lockjaw  . Ibuprofen Hives  . Morphine And Related Nausea And Vomiting  . Promethazine Hcl Nausea And Vomiting    Chief Complaint  Patient presents with  . Medical Managment of Chronic Issues    f/u HTN/med's refill  . other    rash on both sides and RT lower leg x 1 week    HPI: Patient is a 58 y.o. white female seen in the office today for f/u of chronic conditions.  Pt was asked to come in b/c she has not been seen in ages and continues to want to receive her pain meds.   Has rash for a  Couple of mos Had terminex out to house Thought she had ringworm, but did not respond to treatment Right lower leg and sides--very itchy Father of her home health pt is also itching She thinks she picked it up from a chair there  Was unable to explain how she got the antecubital fossa abscess she was treated for in urgent care.  Review of Systems:  Review of Systems  Constitutional: Negative for fever, chills and malaise/fatigue.  HENT: Negative for congestion.   Respiratory: Negative for shortness of breath.   Cardiovascular: Negative for chest pain.  Gastrointestinal: Positive for nausea and abdominal pain.  Genitourinary: Negative for dysuria.  Musculoskeletal: Negative for myalgias.  Skin: Positive for itching and rash.  Neurological: Negative for dizziness.  Psychiatric/Behavioral: Negative for depression and memory loss. The patient is nervous/anxious.      Past Medical History  Diagnosis Date  . Hypertension   . Abdominal pain 2000    chronic abdominal pain with nausea and vomiting  . Allergy     Past Surgical History  Procedure Laterality Date  . Abdominal  hysterectomy    . Cholecystectomy      Social History:   reports that she has been smoking Cigarettes.  She has been smoking about 0.50 packs per day. She has never used smokeless tobacco. She reports that she drinks alcohol. She reports that she does not use illicit drugs.  Family History  Problem Relation Age of Onset  . Hypertension Father   . Cancer - Other Father     Bladder  . Hypertension Mother   . Diabetes Maternal Grandmother   . Cancer - Other Maternal Grandmother     Pancreatic  . Cancer - Other Paternal Grandmother     Bone  . Cancer Father   . Cancer Father   . Cancer Father     Medications: Patient's Medications  New Prescriptions   No medications on file  Previous Medications   AMLODIPINE (NORVASC) 10 MG TABLET    Take one tablet by mouth once daily for blood pressure   LISINOPRIL (PRINIVIL,ZESTRIL) 20 MG TABLET    Take 1 tablet (20 mg total) by mouth daily.   LORAZEPAM (ATIVAN) 0.5 MG TABLET    Take one tablet every 6 hours as needed for anxiety   OXYCODONE (ROXICODONE) 5 MG IMMEDIATE RELEASE TABLET    Take one tablet by mouth every 6 hours as needed for pain   SIMVASTATIN (ZOCOR) 10 MG TABLET    Take 1 tablet (10  mg total) by mouth at bedtime.   ZOLPIDEM (AMBIEN) 10 MG TABLET    Take 1 tablet (10 mg total) by mouth at bedtime as needed. For sleep  Modified Medications   No medications on file  Discontinued Medications   METOCLOPRAMIDE (REGLAN) 10 MG TABLET    Take 1 tablet (10 mg total) by mouth every 6 (six) hours.   ONDANSETRON (ZOFRAN ODT) 4 MG DISINTEGRATING TABLET    Take 1 tablet (4 mg total) by mouth every 8 (eight) hours as needed for nausea.   SULFAMETHOXAZOLE-TRIMETHOPRIM (BACTRIM DS,SEPTRA DS) 800-160 MG PER TABLET    Take 1 tablet by mouth 2 (two) times daily.     Physical Exam: Filed Vitals:   09/01/13 1629  BP: 144/86  Pulse: 102  Temp: 97.2 F (36.2 C)  TempSrc: Oral  Resp: 14  Weight: 148 lb 12.8 oz (67.495 kg)  SpO2: 99%    Physical Exam  Constitutional: She appears well-developed and well-nourished. No distress.  Cardiovascular: Normal rate, regular rhythm and normal heart sounds.   Pulmonary/Chest: Effort normal and breath sounds normal.  Abdominal: Soft. Bowel sounds are normal.  Skin:  Linear rash on right leg, abdomen, chest, and back  Psychiatric:  Anxious and talks with pressured speech    Labs reviewed: Basic Metabolic Panel:  Recent Labs  10/10/12 0534 10/10/12 1945  NA 142 141  K 3.5 3.3*  CL 103 102  CO2 24 24  GLUCOSE 164* 136*  BUN 14 15  CREATININE 0.50 0.50  CALCIUM 10.1 9.9   Liver Function Tests:  Recent Labs  10/10/12 0534  AST 14  ALT 15  ALKPHOS 133*  BILITOT 0.3  PROT 7.9  ALBUMIN 4.6    Recent Labs  10/10/12 0534  LIPASE 10*  CBC:  Recent Labs  10/10/12 0534  WBC 12.9*  NEUTROABS 11.7*  HGB 14.5  HCT 42.8  MCV 93.2  PLT 364   Lab Results  Component Value Date   HGBA1C 5.8* 03/15/2011    Assessment/Plan 1. Scabies infestation -possible--if not improving, would ask her to see dermatology - permethrin (ELIMITE) 5 % cream; Apply 1 application topically once. Today and repeat in 1 week  Dispense: 60 g; Refill: 1 -treat twice and wash clothes and bedding/towels in hot water  2. Anxiety state, unspecified-meds refilled - LORazepam (ATIVAN) 0.5 MG tablet; Take one tablet every 6 hours as needed for anxiety  Dispense: 120 tablet; Refill: 0  3. Chronic RUQ pain -discussed pain meds--will plan to change back to oxycontin 10mg  long acting next time--claims that is what she was on and is more affordable  4. Tobacco use disorder -continues to smoke  F/u 6 mos

## 2013-09-29 ENCOUNTER — Other Ambulatory Visit: Payer: Self-pay | Admitting: *Deleted

## 2013-09-29 MED ORDER — OXYCODONE HCL ER 10 MG PO T12A: EXTENDED_RELEASE_TABLET | ORAL | Status: DC

## 2013-10-06 ENCOUNTER — Other Ambulatory Visit: Payer: Self-pay | Admitting: *Deleted

## 2013-10-06 DIAGNOSIS — F411 Generalized anxiety disorder: Secondary | ICD-10-CM

## 2013-10-06 MED ORDER — LORAZEPAM 0.5 MG PO TABS: ORAL_TABLET | ORAL | Status: DC

## 2013-10-08 ENCOUNTER — Ambulatory Visit: Payer: Self-pay | Admitting: Emergency Medicine

## 2013-10-08 VITALS — BP 132/74 | HR 118 | Temp 98.0°F | Resp 16 | Ht 65.0 in | Wt 148.0 lb

## 2013-10-08 DIAGNOSIS — M5430 Sciatica, unspecified side: Secondary | ICD-10-CM

## 2013-10-08 MED ORDER — CYCLOBENZAPRINE HCL 10 MG PO TABS: 10.0000 mg | ORAL_TABLET | Freq: Three times a day (TID) | ORAL | Status: DC | PRN

## 2013-10-08 MED ORDER — NAPROXEN SODIUM 550 MG PO TABS: 550.0000 mg | ORAL_TABLET | Freq: Two times a day (BID) | ORAL | Status: DC

## 2013-10-08 NOTE — Patient Instructions (Signed)
Sciatica °Sciatica is pain, weakness, numbness, or tingling along the path of the sciatic nerve. The nerve starts in the lower back and runs down the back of each leg. The nerve controls the muscles in the lower leg and in the back of the knee, while also providing sensation to the back of the thigh, lower leg, and the sole of your foot. Sciatica is a symptom of another medical condition. For instance, nerve damage or certain conditions, such as a herniated disk or bone spur on the spine, pinch or put pressure on the sciatic nerve. This causes the pain, weakness, or other sensations normally associated with sciatica. Generally, sciatica only affects one side of the body. °CAUSES  °· Herniated or slipped disc. °· Degenerative disk disease. °· A pain disorder involving the narrow muscle in the buttocks (piriformis syndrome). °· Pelvic injury or fracture. °· Pregnancy. °· Tumor (rare). °SYMPTOMS  °Symptoms can vary from mild to very severe. The symptoms usually travel from the low back to the buttocks and down the back of the leg. Symptoms can include: °· Mild tingling or dull aches in the lower back, leg, or hip. °· Numbness in the back of the calf or sole of the foot. °· Burning sensations in the lower back, leg, or hip. °· Sharp pains in the lower back, leg, or hip. °· Leg weakness. °· Severe back pain inhibiting movement. °These symptoms may get worse with coughing, sneezing, laughing, or prolonged sitting or standing. Also, being overweight may worsen symptoms. °DIAGNOSIS  °Your caregiver will perform a physical exam to look for common symptoms of sciatica. He or she may ask you to do certain movements or activities that would trigger sciatic nerve pain. Other tests may be performed to find the cause of the sciatica. These may include: °· Blood tests. °· X-rays. °· Imaging tests, such as an MRI or CT scan. °TREATMENT  °Treatment is directed at the cause of the sciatic pain. Sometimes, treatment is not necessary  and the pain and discomfort goes away on its own. If treatment is needed, your caregiver may suggest: °· Over-the-counter medicines to relieve pain. °· Prescription medicines, such as anti-inflammatory medicine, muscle relaxants, or narcotics. °· Applying heat or ice to the painful area. °· Steroid injections to lessen pain, irritation, and inflammation around the nerve. °· Reducing activity during periods of pain. °· Exercising and stretching to strengthen your abdomen and improve flexibility of your spine. Your caregiver may suggest losing weight if the extra weight makes the back pain worse. °· Physical therapy. °· Surgery to eliminate what is pressing or pinching the nerve, such as a bone spur or part of a herniated disk. °HOME CARE INSTRUCTIONS  °· Only take over-the-counter or prescription medicines for pain or discomfort as directed by your caregiver. °· Apply ice to the affected area for 20 minutes, 3 4 times a day for the first 48 72 hours. Then try heat in the same way. °· Exercise, stretch, or perform your usual activities if these do not aggravate your pain. °· Attend physical therapy sessions as directed by your caregiver. °· Keep all follow-up appointments as directed by your caregiver. °· Do not wear high heels or shoes that do not provide proper support. °· Check your mattress to see if it is too soft. A firm mattress may lessen your pain and discomfort. °SEEK IMMEDIATE MEDICAL CARE IF:  °· You lose control of your bowel or bladder (incontinence). °· You have increasing weakness in the lower back,   pelvis, buttocks, or legs. °· You have redness or swelling of your back. °· You have a burning sensation when you urinate. °· You have pain that gets worse when you lie down or awakens you at night. °· Your pain is worse than you have experienced in the past. °· Your pain is lasting longer than 4 weeks. °· You are suddenly losing weight without reason. °MAKE SURE YOU: °· Understand these  instructions. °· Will watch your condition. °· Will get help right away if you are not doing well or get worse. °Document Released: 08/05/2001 Document Revised: 02/10/2012 Document Reviewed: 12/21/2011 °[employer]® Patient Information ©2014 [employer]. ° °

## 2013-10-08 NOTE — Progress Notes (Signed)
[facility] [address], [place] [address] [phone]  Date:  10/08/2013   Name:  Theresa Carey   DOB:  [birth year]   MRN:  [id number]  PCP:  Hollace Kinnier, DO    Chief Complaint: Foot Pain   History of Present Illness:  Aniylah Carey is a 58 y.o. very pleasant female patient who presents with the following:  Has pain in left calf and knee for past two weeks.  Says increases on arising in AM and with sitting.  No neuro symptoms or weakness.  No history of injury or overuse.  No history of travel or immobilization.  No swelling in leg.  No meds.  No back injury.  No improvement with over the counter medications or other home remedies. Denies other complaint or health concern today.   Patient Active Problem List   Diagnosis Date Noted  . Intractable nausea and vomiting 08/14/2012  . Hypokalemia 08/14/2012  . Dehydration 08/14/2012  . Chronic RUQ pain 08/14/2012  . Tobacco use disorder 08/14/2012    Past Medical History  Diagnosis Date  . Hypertension   . Abdominal pain 2000    chronic abdominal pain with nausea and vomiting  . Allergy     Past Surgical History  Procedure Laterality Date  . Abdominal hysterectomy    . Cholecystectomy      History  Substance Use Topics  . Smoking status: Current Every Day Smoker -- 0.50 packs/day    Types: Cigarettes  . Smokeless tobacco: Never Used  . Alcohol Use: Yes     Comment: socially    Family History  Problem Relation Age of Onset  . Hypertension Father   . Cancer - Other Father     Bladder  . Hypertension Mother   . Diabetes Maternal Grandmother   . Cancer - Other Maternal Grandmother     Pancreatic  . Cancer - Other Paternal Grandmother     Bone  . Cancer Father   . Cancer Father   . Cancer Father     Allergies  Allergen Reactions  . Avelox [Moxifloxacin Hcl In Nacl]     Low blood pressure  . Compazine [Prochlorperazine Edisylate] Other (See Comments)    Caused lockjaw  .  Ibuprofen Hives  . Morphine And Related Nausea And Vomiting  . Promethazine Hcl Nausea And Vomiting    Medication list has been reviewed and updated.  Current Outpatient Prescriptions on File Prior to Visit  Medication Sig Dispense Refill  . amLODipine (NORVASC) 10 MG tablet Take one tablet by mouth once daily for blood pressure  30 tablet  3  . lisinopril (PRINIVIL,ZESTRIL) 20 MG tablet Take 1 tablet (20 mg total) by mouth daily.  30 tablet  3  . LORazepam (ATIVAN) 0.5 MG tablet Take one tablet every 6 hours as needed for anxiety  120 tablet  0  . OxyCODONE (OXYCONTIN) 10 mg T12A 12 hr tablet Take one tablet by mouth every 12 hours for pain  60 tablet  0  . permethrin (ELIMITE) 5 % cream Apply 1 application topically once. Today and repeat in 1 week  60 g  1  . simvastatin (ZOCOR) 10 MG tablet Take 1 tablet (10 mg total) by mouth at bedtime.  30 tablet  3  . zolpidem (AMBIEN) 10 MG tablet Take 1 tablet (10 mg total) by mouth at bedtime as needed. For sleep  30 tablet  1   No current facility-administered medications on file prior to  visit.    Review of Systems:  As per HPI, otherwise negative.    Physical Examination: Filed Vitals:   10/08/13 1210  BP: 132/74  Pulse: 118  Temp: 98 F (36.7 C)  Resp: 16   Filed Vitals:   10/08/13 1210  Height: 5\' 5"  (1.651 m)  Weight: 148 lb (67.132 kg)   Body mass index is 24.63 kg/(m^2). Ideal Body Weight: Weight in (lb) to have BMI = 25: 149.9   GEN: WDWN, NAD, Non-toxic, Alert & Oriented x 3 HEENT: Atraumatic, Normocephalic.  Ears and Nose: No external deformity. EXTR: No clubbing/cyanosis/edema NEURO: Normal gait.  PSYCH: Normally interactive. Conversant. Not depressed or anxious appearing.  Calm demeanor.  BACK:  No tenderness or ecchymosis.   Left leg SLR positive.  DTR's symmetrical   Assessment and Plan: Sciatic neuritis Anaprox  Signed,  Ellison Carwin, MD

## 2013-10-27 ENCOUNTER — Other Ambulatory Visit: Payer: Self-pay | Admitting: *Deleted

## 2013-10-27 MED ORDER — OXYCODONE HCL ER 10 MG PO T12A: EXTENDED_RELEASE_TABLET | ORAL | Status: DC

## 2013-11-04 ENCOUNTER — Other Ambulatory Visit: Payer: Self-pay | Admitting: *Deleted

## 2013-11-04 MED ORDER — ZOLPIDEM TARTRATE 10 MG PO TABS: 10.0000 mg | ORAL_TABLET | Freq: Every evening | ORAL | Status: DC | PRN

## 2013-11-04 NOTE — Telephone Encounter (Signed)
Patient request

## 2013-11-07 ENCOUNTER — Other Ambulatory Visit: Payer: Self-pay | Admitting: *Deleted

## 2013-11-07 MED ORDER — AMLODIPINE BESYLATE 10 MG PO TABS: ORAL_TABLET | ORAL | Status: DC

## 2013-11-07 NOTE — Telephone Encounter (Signed)
[facility] 

## 2013-11-14 ENCOUNTER — Other Ambulatory Visit: Payer: Self-pay | Admitting: *Deleted

## 2013-11-14 DIAGNOSIS — F411 Generalized anxiety disorder: Secondary | ICD-10-CM

## 2013-11-14 MED ORDER — AMLODIPINE BESYLATE 10 MG PO TABS: ORAL_TABLET | ORAL | Status: DC

## 2013-11-14 MED ORDER — LORAZEPAM 0.5 MG PO TABS: ORAL_TABLET | ORAL | Status: DC

## 2013-11-24 ENCOUNTER — Other Ambulatory Visit: Payer: Self-pay | Admitting: *Deleted

## 2013-11-24 MED ORDER — OXYCODONE HCL ER 10 MG PO T12A: EXTENDED_RELEASE_TABLET | ORAL | Status: DC

## 2013-12-06 ENCOUNTER — Other Ambulatory Visit: Payer: Self-pay | Admitting: *Deleted

## 2013-12-06 MED ORDER — LISINOPRIL 20 MG PO TABS: 20.0000 mg | ORAL_TABLET | Freq: Every day | ORAL | Status: DC

## 2013-12-09 ENCOUNTER — Other Ambulatory Visit: Payer: Self-pay | Admitting: *Deleted

## 2013-12-09 MED ORDER — ZOLPIDEM TARTRATE 10 MG PO TABS: 10.0000 mg | ORAL_TABLET | Freq: Every evening | ORAL | Status: DC | PRN

## 2013-12-09 NOTE — Telephone Encounter (Signed)
Patient Requested 

## 2013-12-22 ENCOUNTER — Other Ambulatory Visit: Payer: Self-pay

## 2013-12-22 MED ORDER — OXYCODONE HCL ER 10 MG PO T12A: EXTENDED_RELEASE_TABLET | ORAL | Status: DC

## 2013-12-30 ENCOUNTER — Other Ambulatory Visit: Payer: Self-pay | Admitting: *Deleted

## 2013-12-30 DIAGNOSIS — F411 Generalized anxiety disorder: Secondary | ICD-10-CM

## 2013-12-30 MED ORDER — LORAZEPAM 0.5 MG PO TABS: ORAL_TABLET | ORAL | Status: DC

## 2013-12-30 NOTE — Telephone Encounter (Signed)
[facility] 

## 2014-01-10 ENCOUNTER — Other Ambulatory Visit: Payer: Self-pay | Admitting: *Deleted

## 2014-01-10 MED ORDER — SIMVASTATIN 10 MG PO TABS: ORAL_TABLET | ORAL | Status: DC

## 2014-01-10 NOTE — Telephone Encounter (Signed)
[facility] 

## 2014-01-18 ENCOUNTER — Other Ambulatory Visit: Payer: Self-pay | Admitting: *Deleted

## 2014-01-18 MED ORDER — ZOLPIDEM TARTRATE 10 MG PO TABS: 10.0000 mg | ORAL_TABLET | Freq: Every evening | ORAL | Status: DC | PRN

## 2014-01-23 ENCOUNTER — Other Ambulatory Visit: Payer: Self-pay | Admitting: *Deleted

## 2014-01-23 MED ORDER — OXYCODONE HCL ER 10 MG PO T12A: EXTENDED_RELEASE_TABLET | ORAL | Status: DC

## 2014-02-09 ENCOUNTER — Other Ambulatory Visit: Payer: Self-pay | Admitting: *Deleted

## 2014-02-09 DIAGNOSIS — F411 Generalized anxiety disorder: Secondary | ICD-10-CM

## 2014-02-09 MED ORDER — LORAZEPAM 0.5 MG PO TABS: ORAL_TABLET | ORAL | Status: DC

## 2014-02-16 ENCOUNTER — Other Ambulatory Visit: Payer: Self-pay | Admitting: *Deleted

## 2014-02-16 MED ORDER — ZOLPIDEM TARTRATE 10 MG PO TABS: 10.0000 mg | ORAL_TABLET | Freq: Every evening | ORAL | Status: DC | PRN

## 2014-02-16 NOTE — Telephone Encounter (Signed)
[employer]

## 2014-02-22 ENCOUNTER — Other Ambulatory Visit: Payer: Self-pay | Admitting: *Deleted

## 2014-02-22 MED ORDER — OXYCODONE HCL ER 10 MG PO T12A: EXTENDED_RELEASE_TABLET | ORAL | Status: DC

## 2014-03-02 ENCOUNTER — Telehealth: Payer: Self-pay | Admitting: *Deleted

## 2014-03-02 ENCOUNTER — Ambulatory Visit (INDEPENDENT_AMBULATORY_CARE_PROVIDER_SITE_OTHER): Payer: Self-pay | Admitting: Internal Medicine

## 2014-03-02 ENCOUNTER — Encounter: Payer: Self-pay | Admitting: Internal Medicine

## 2014-03-02 VITALS — BP 130/80 | HR 96 | Temp 98.2°F | Resp 18 | Ht 65.0 in | Wt 148.0 lb

## 2014-03-02 DIAGNOSIS — F17200 Nicotine dependence, unspecified, uncomplicated: Secondary | ICD-10-CM

## 2014-03-02 DIAGNOSIS — R739 Hyperglycemia, unspecified: Secondary | ICD-10-CM

## 2014-03-02 DIAGNOSIS — E785 Hyperlipidemia, unspecified: Secondary | ICD-10-CM

## 2014-03-02 DIAGNOSIS — R7309 Other abnormal glucose: Secondary | ICD-10-CM

## 2014-03-02 DIAGNOSIS — R030 Elevated blood-pressure reading, without diagnosis of hypertension: Secondary | ICD-10-CM

## 2014-03-02 DIAGNOSIS — G8929 Other chronic pain: Secondary | ICD-10-CM

## 2014-03-02 DIAGNOSIS — F411 Generalized anxiety disorder: Secondary | ICD-10-CM

## 2014-03-02 DIAGNOSIS — I10 Essential (primary) hypertension: Secondary | ICD-10-CM

## 2014-03-02 DIAGNOSIS — R0989 Other specified symptoms and signs involving the circulatory and respiratory systems: Secondary | ICD-10-CM

## 2014-03-02 DIAGNOSIS — R000 Tachycardia, unspecified: Principal | ICD-10-CM

## 2014-03-02 DIAGNOSIS — R1011 Right upper quadrant pain: Secondary | ICD-10-CM

## 2014-03-02 MED ORDER — OMEPRAZOLE 20 MG PO CPDR: 20.0000 mg | DELAYED_RELEASE_CAPSULE | Freq: Every day | ORAL | Status: DC

## 2014-03-02 MED ORDER — FLUCONAZOLE 150 MG PO TABS: ORAL_TABLET | ORAL | Status: DC

## 2014-03-02 MED ORDER — LORAZEPAM 0.5 MG PO TABS: ORAL_TABLET | ORAL | Status: DC

## 2014-03-02 MED ORDER — METOPROLOL TARTRATE 25 MG PO TABS: 25.0000 mg | ORAL_TABLET | Freq: Two times a day (BID) | ORAL | Status: DC

## 2014-03-02 NOTE — Progress Notes (Signed)
Patient ID: Theresa Carey, female   DOB: 24-Sep-1955, 58 y.o.   MRN: [id number]   Location:  [facility]  Code Status: full code  Allergies  Allergen Reactions  . Avelox [Moxifloxacin Hcl In Nacl]     Low blood pressure  . Compazine [Prochlorperazine Edisylate] Other (See Comments)    Caused lockjaw  . Ibuprofen Hives  . Morphine And Related Nausea And Vomiting  . Promethazine Hcl Nausea And Vomiting    Chief Complaint  Patient presents with  . Follow-up    HPI: Patient is a 58 y.o. white female seen in the office today for f/u on chronic illnesses.    Went to health dept for scabies b/c it didn't go away.    Did follow her blood pressures since 7/3 and they are running mostly over 148/88 up to 186/72.  Pulse 90s-100s also.    Last episode was early last year of going to ED.  Having indigestion--using zantac.  Had one episode where she vomited 3x filling up the sink each time.    Is stressed out with her dad having Parkinson's and he looks after her mom.  She may have to move to [place].    Review of Systems:  Review of Systems  Constitutional: Positive for malaise/fatigue. Negative for fever.  HENT: Negative for congestion.   Eyes: Positive for blurred vision.       Needs to go to eye doctor due to farsightedness--headaches  Respiratory: Negative for shortness of breath.   Cardiovascular: Negative for chest pain and leg swelling.  Gastrointestinal: Positive for heartburn, nausea, vomiting and abdominal pain. Negative for diarrhea, constipation, blood in stool and melena.  Genitourinary: Negative for dysuria.  Musculoskeletal: Negative for falls and myalgias.  Skin: Negative for rash.  Neurological: Positive for headaches. Negative for dizziness, loss of consciousness and weakness.  Psychiatric/Behavioral: Negative for memory loss. The patient is nervous/anxious.      Past Medical History  Diagnosis Date  . Hypertension   .  Abdominal pain 2000    chronic abdominal pain with nausea and vomiting  . Allergy     Past Surgical History  Procedure Laterality Date  . Abdominal hysterectomy    . Cholecystectomy      Social History:   reports that she has been smoking Cigarettes.  She has been smoking about 0.50 packs per day. She has never used smokeless tobacco. She reports that she drinks alcohol. She reports that she does not use illicit drugs.  Family History  Problem Relation Age of Onset  . Hypertension Father   . Cancer - Other Father     Bladder  . Hypertension Mother   . Diabetes Maternal Grandmother   . Cancer - Other Maternal Grandmother     Pancreatic  . Cancer - Other Paternal Grandmother     Bone  . Cancer Father   . Cancer Father   . Cancer Father     Medications: Patient's Medications  New Prescriptions   No medications on file  Previous Medications   AMLODIPINE (NORVASC) 10 MG TABLET    Take one tablet by mouth once daily for blood pressure   LISINOPRIL (PRINIVIL,ZESTRIL) 20 MG TABLET    Take 1 tablet (20 mg total) by mouth daily.   LORAZEPAM (ATIVAN) 0.5 MG TABLET    Take one tablet every 6 hours as needed for anxiety   OXYCODONE (OXYCONTIN) 10 MG T12A 12 HR TABLET    Take one tablet  by mouth every 12 hours for pain   SIMVASTATIN (ZOCOR) 10 MG TABLET    Take one tablet by mouth at bedtime for cholesterol   ZOLPIDEM (AMBIEN) 10 MG TABLET    Take 1 tablet (10 mg total) by mouth at bedtime as needed. For sleep  Modified Medications   No medications on file  Discontinued Medications   CYCLOBENZAPRINE (FLEXERIL) 10 MG TABLET    Take 1 tablet (10 mg total) by mouth 3 (three) times daily as needed for muscle spasms.   NAPROXEN SODIUM (ANAPROX DS) 550 MG TABLET    Take 1 tablet (550 mg total) by mouth 2 (two) times daily with a meal.   PERMETHRIN (ELIMITE) 5 % CREAM    Apply 1 application topically once. Today and repeat in 1 week     Physical Exam: Filed Vitals:   03/02/14 0731    BP: 130/80  Pulse: 96  Temp: 98.2 F (36.8 C)  TempSrc: Oral  Resp: 18  Height: 5\' 5"  (1.651 m)  Weight: 148 lb (67.132 kg)  SpO2: 99%  Physical Exam  Constitutional: She is oriented to person, place, and time. She appears well-developed and well-nourished. No distress.  Cardiovascular: Normal rate, regular rhythm, normal heart sounds and intact distal pulses.   Pulmonary/Chest: Effort normal and breath sounds normal. No respiratory distress.  Abdominal: Soft. Bowel sounds are normal. She exhibits no distension and no mass. There is tenderness. There is no guarding.  Musculoskeletal: Normal range of motion.  Neurological: She is alert and oriented to person, place, and time.  Psychiatric:  anxious    Labs reviewed:  Lab Results  Component Value Date   HGBA1C 5.8* 03/15/2011   Assessment/Plan 1. Tachycardia with hypertension - EKG unremarkable, will start on low dose lopressor for rate and bp - EKG 12-Lead - metoprolol tartrate (LOPRESSOR) 25 MG tablet; Take 1 tablet (25 mg total) by mouth 2 (two) times daily.  Dispense: 180 tablet; Refill: 3  2. Chronic RUQ pain - etiology has never been clear--has not done well since gallbladder removed - CBC With differential/Platelet - Comprehensive metabolic panel - omeprazole (PRILOSEC) 20 MG capsule; Take 1 capsule (20 mg total) by mouth daily.  Dispense: 30 capsule; Refill: 3  3. Anxiety state, unspecified - always talks fast and very nervous - LORazepam (ATIVAN) 0.5 MG tablet; Take one tablet every 6 hours as needed for anxiety  Dispense: 120 tablet; Refill: 0  4. Tobacco use disorder - continues to smoke (doesn't help 1 or 3), but has cut back--can only smoke before and after work - Hemoglobin A1c - Lipid panel  5. Essential hypertension - not controlled, add lopressor to norvasc and lisinopril - metoprolol tartrate (LOPRESSOR) 25 MG tablet; Take 1 tablet (25 mg total) by mouth 2 (two) times daily.  Dispense: 180 tablet;  Refill: 3  6. Hyperglycemia -no recent eval so will check Hemoglobin A1c  7. Other and unspecified hyperlipidemia - cont zocor - Lipid panel  Labs/tests ordered:   Orders Placed This Encounter  Procedures  . CBC With differential/Platelet  . Comprehensive metabolic panel  . Hemoglobin A1c  . Lipid panel  . EKG 12-Lead    Order Specific Question:  Where should this test be performed    Answer:  OTHER   Next appt:  6 mos

## 2014-03-02 NOTE — Telephone Encounter (Signed)
Rx faxed to pharmacy and patient notified.

## 2014-03-02 NOTE — Telephone Encounter (Signed)
Ok.  Diflucan 150mg  One tab today, then repeat one tab in 2 days. Dispense 2 pills Thanks

## 2014-03-02 NOTE — Telephone Encounter (Signed)
Patient called and stated that she was just seen but forgot to mention that she needs a Diflucan called in for a yeast infection. Please Advise

## 2014-03-02 NOTE — Patient Instructions (Addendum)
Please call to arrange a mammogram for yourself b/c of not having insurance to pay for this as we discussed.

## 2014-03-03 ENCOUNTER — Encounter: Payer: Self-pay | Admitting: *Deleted

## 2014-03-03 ENCOUNTER — Encounter: Payer: Self-pay | Admitting: Internal Medicine

## 2014-03-03 DIAGNOSIS — R740 Nonspecific elevation of levels of transaminase and lactic acid dehydrogenase [LDH]: Secondary | ICD-10-CM

## 2014-03-03 DIAGNOSIS — R7401 Elevation of levels of liver transaminase levels: Secondary | ICD-10-CM | POA: Insufficient documentation

## 2014-03-03 LAB — CBC WITH DIFFERENTIAL
Basophils Absolute: 0 10*3/uL (ref 0.0–0.2)
Basos: 1 %
Eos: 3 %
Eosinophils Absolute: 0.2 10*3/uL (ref 0.0–0.4)
HCT: 37 % (ref 34.0–46.6)
Hemoglobin: 12 g/dL (ref 11.1–15.9)
Immature Grans (Abs): 0 10*3/uL (ref 0.0–0.1)
Immature Granulocytes: 0 %
Lymphocytes Absolute: 2.9 10*3/uL (ref 0.7–3.1)
Lymphs: 46 %
MCH: 31 pg (ref 26.6–33.0)
MCHC: 32.4 g/dL (ref 31.5–35.7)
MCV: 96 fL (ref 79–97)
Monocytes Absolute: 0.5 10*3/uL (ref 0.1–0.9)
Monocytes: 8 %
Neutrophils Absolute: 2.7 10*3/uL (ref 1.4–7.0)
Neutrophils Relative %: 42 %
Platelets: 349 10*3/uL (ref 150–379)
RBC: 3.87 x10E6/uL (ref 3.77–5.28)
RDW: 14.3 % (ref 12.3–15.4)
WBC: 6.3 10*3/uL (ref 3.4–10.8)

## 2014-03-03 LAB — LIPID PANEL
Chol/HDL Ratio: 3.5 ratio units (ref 0.0–4.4)
Cholesterol, Total: 155 mg/dL (ref 100–199)
HDL: 44 mg/dL (ref >39)
LDL Calculated: 88 mg/dL (ref 0–99)
Triglycerides: 114 mg/dL (ref 0–149)
VLDL Cholesterol Cal: 23 mg/dL (ref 5–40)

## 2014-03-03 LAB — COMPREHENSIVE METABOLIC PANEL
ALT: 68 IU/L — ABNORMAL HIGH (ref 0–32)
AST: 16 IU/L (ref 0–40)
Albumin/Globulin Ratio: 2.3 (ref 1.1–2.5)
Albumin: 4.4 g/dL (ref 3.6–4.8)
Alkaline Phosphatase: 124 IU/L — ABNORMAL HIGH (ref 39–117)
BUN/Creatinine Ratio: 21 (ref 11–26)
BUN: 14 mg/dL (ref 8–27)
CO2: 25 mmol/L (ref 18–29)
Calcium: 9 mg/dL (ref 8.7–10.3)
Chloride: 103 mmol/L (ref 97–108)
Creatinine, Ser: 0.68 mg/dL (ref 0.57–1.00)
GFR calc Af Amer: 110 mL/min/{1.73_m2} (ref >59)
GFR calc non Af Amer: 95 mL/min/{1.73_m2} (ref >59)
Globulin, Total: 1.9 g/dL (ref 1.5–4.5)
Glucose: 88 mg/dL (ref 65–99)
Potassium: 4.4 mmol/L (ref 3.5–5.2)
Sodium: 141 mmol/L (ref 134–144)
Total Bilirubin: 0.2 mg/dL (ref 0.0–1.2)
Total Protein: 6.3 g/dL (ref 6.0–8.5)

## 2014-03-03 LAB — HEMOGLOBIN A1C
Est. average glucose Bld gHb Est-mCnc: 120 mg/dL
Hgb A1c MFr Bld: 5.8 % — ABNORMAL HIGH (ref 4.8–5.6)

## 2014-03-03 LAB — SPECIMEN STATUS REPORT

## 2014-03-04 LAB — HEPATITIS PANEL, ACUTE
Hep A IgM: NEGATIVE
Hep B C IgM: NEGATIVE
Hep C Virus Ab: <0.1 s/co ratio (ref 0.0–0.9)
Hepatitis B Surface Ag: NEGATIVE

## 2014-03-04 LAB — SPECIMEN STATUS REPORT

## 2014-03-17 ENCOUNTER — Other Ambulatory Visit: Payer: Self-pay | Admitting: *Deleted

## 2014-03-17 MED ORDER — AMLODIPINE BESYLATE 10 MG PO TABS: ORAL_TABLET | ORAL | Status: DC

## 2014-03-17 MED ORDER — ZOLPIDEM TARTRATE 10 MG PO TABS: 10.0000 mg | ORAL_TABLET | Freq: Every evening | ORAL | Status: DC | PRN

## 2014-03-17 NOTE — Telephone Encounter (Signed)
[facility]

## 2014-03-17 NOTE — Telephone Encounter (Signed)
[facility] 

## 2014-03-22 ENCOUNTER — Other Ambulatory Visit: Payer: Self-pay | Admitting: *Deleted

## 2014-03-22 MED ORDER — AMLODIPINE BESYLATE 10 MG PO TABS: ORAL_TABLET | ORAL | Status: DC

## 2014-03-23 ENCOUNTER — Other Ambulatory Visit: Payer: Self-pay | Admitting: *Deleted

## 2014-03-23 MED ORDER — OXYCODONE HCL ER 10 MG PO T12A: EXTENDED_RELEASE_TABLET | ORAL | Status: DC

## 2014-03-23 NOTE — Telephone Encounter (Signed)
Patient Requested 

## 2014-04-03 ENCOUNTER — Other Ambulatory Visit: Payer: Self-pay | Admitting: *Deleted

## 2014-04-03 DIAGNOSIS — F411 Generalized anxiety disorder: Secondary | ICD-10-CM

## 2014-04-03 MED ORDER — LORAZEPAM 0.5 MG PO TABS: ORAL_TABLET | ORAL | Status: DC

## 2014-04-03 NOTE — Telephone Encounter (Signed)
[facility] 

## 2014-04-17 ENCOUNTER — Other Ambulatory Visit: Payer: Self-pay | Admitting: *Deleted

## 2014-04-17 MED ORDER — ZOLPIDEM TARTRATE 10 MG PO TABS: 10.0000 mg | ORAL_TABLET | Freq: Every evening | ORAL | Status: DC | PRN

## 2014-04-17 NOTE — Telephone Encounter (Signed)
Patient Requested 

## 2014-04-20 ENCOUNTER — Telehealth: Payer: Self-pay

## 2014-04-20 ENCOUNTER — Other Ambulatory Visit: Payer: Self-pay | Admitting: Internal Medicine

## 2014-04-20 MED ORDER — OXYCODONE HCL ER 10 MG PO T12A: EXTENDED_RELEASE_TABLET | ORAL | Status: DC

## 2014-04-20 NOTE — Telephone Encounter (Signed)
Patient was here to pick-up rx for ambien and questioned if she could pick-up rx for Oxycodone as well. RX was last written on 03/23/3014. RX was printed and placed on ledge for signature then to patient.

## 2014-04-23 ENCOUNTER — Emergency Department (HOSPITAL_COMMUNITY): Payer: Self-pay

## 2014-04-23 ENCOUNTER — Emergency Department (HOSPITAL_COMMUNITY)
Admission: EM | Admit: 2014-04-23 | Discharge: 2014-04-23 | Disposition: A | Discharge status: Home / Self Care | Payer: Self-pay | Attending: Emergency Medicine | Admitting: Emergency Medicine

## 2014-04-23 ENCOUNTER — Encounter (HOSPITAL_COMMUNITY): Payer: Self-pay | Admitting: Emergency Medicine

## 2014-04-23 DIAGNOSIS — S6990XA Unspecified injury of unspecified wrist, hand and finger(s), initial encounter: Secondary | ICD-10-CM | POA: Insufficient documentation

## 2014-04-23 DIAGNOSIS — Z79899 Other long term (current) drug therapy: Secondary | ICD-10-CM | POA: Insufficient documentation

## 2014-04-23 DIAGNOSIS — F17200 Nicotine dependence, unspecified, uncomplicated: Secondary | ICD-10-CM | POA: Insufficient documentation

## 2014-04-23 DIAGNOSIS — S42252A Displaced fracture of greater tuberosity of left humerus, initial encounter for closed fracture: Secondary | ICD-10-CM

## 2014-04-23 DIAGNOSIS — Z8719 Personal history of other diseases of the digestive system: Secondary | ICD-10-CM | POA: Insufficient documentation

## 2014-04-23 DIAGNOSIS — W19XXXA Unspecified fall, initial encounter: Secondary | ICD-10-CM

## 2014-04-23 DIAGNOSIS — Y9289 Other specified places as the place of occurrence of the external cause: Secondary | ICD-10-CM | POA: Insufficient documentation

## 2014-04-23 DIAGNOSIS — W010XXA Fall on same level from slipping, tripping and stumbling without subsequent striking against object, initial encounter: Secondary | ICD-10-CM | POA: Insufficient documentation

## 2014-04-23 DIAGNOSIS — R000 Tachycardia, unspecified: Secondary | ICD-10-CM | POA: Insufficient documentation

## 2014-04-23 DIAGNOSIS — I10 Essential (primary) hypertension: Secondary | ICD-10-CM | POA: Insufficient documentation

## 2014-04-23 DIAGNOSIS — S199XXA Unspecified injury of neck, initial encounter: Secondary | ICD-10-CM | POA: Insufficient documentation

## 2014-04-23 DIAGNOSIS — S42253A Displaced fracture of greater tuberosity of unspecified humerus, initial encounter for closed fracture: Secondary | ICD-10-CM | POA: Insufficient documentation

## 2014-04-23 DIAGNOSIS — S298XXA Other specified injuries of thorax, initial encounter: Secondary | ICD-10-CM | POA: Insufficient documentation

## 2014-04-23 DIAGNOSIS — Y9389 Activity, other specified: Secondary | ICD-10-CM | POA: Insufficient documentation

## 2014-04-23 DIAGNOSIS — S0993XA Unspecified injury of face, initial encounter: Secondary | ICD-10-CM | POA: Insufficient documentation

## 2014-04-23 DIAGNOSIS — G8929 Other chronic pain: Secondary | ICD-10-CM | POA: Insufficient documentation

## 2014-04-23 NOTE — ED Provider Notes (Signed)
CSN: [id number]     Arrival date & time 04/23/14  1255 History  This chart was scribed for Michele Mcalpine, PA-C, working with Joseph Berkshire, MD by Steva Colder, ED Scribe. The patient was seen in room TR11C/TR11C at 1:53 PM.    Chief Complaint  Patient presents with  . Fall      The history is provided by the patient. No language interpreter was used.   HPI Comments: Theresa Carey is a 58 y.o. female who presents to the Emergency Department complaining of left arm and rib pain after slipping on water in her kitchen earlier this morning around 4:00 am. States she landed onto her left side. Pain is constant, radiating from below her shoulder through her upper arm rated 9/10, worse with movement. No head injury or LOC, however did bite into the top of her lip. Denies dental pain. She has a small laceration to the inside of her upper lip. She has tried Oxycontin which she has for chronic pain with no relief for her symptoms. Denies difficulty breathing or SOB.  Past Medical History  Diagnosis Date  . Hypertension   . Abdominal pain 2000    chronic abdominal pain with nausea and vomiting  . Allergy   . Abnormal liver function    Past Surgical History  Procedure Laterality Date  . Abdominal hysterectomy    . Cholecystectomy     Family History  Problem Relation Age of Onset  . Hypertension Father   . Cancer - Other Father     Bladder  . Hypertension Mother   . Diabetes Maternal Grandmother   . Cancer - Other Maternal Grandmother     Pancreatic  . Cancer - Other Paternal Grandmother     Bone  . Cancer Father   . Cancer Father   . Cancer Father    History  Substance Use Topics  . Smoking status: Current Every Day Smoker -- 0.50 packs/day    Types: Cigarettes  . Smokeless tobacco: Never Used  . Alcohol Use: Yes     Comment: socially   OB History   Grav Para Term Preterm Abortions TAB SAB Ect Mult Living                 Review of Systems  Musculoskeletal: Positive  for arthralgias (left upper arm).       Left lower rib pain  Skin: Positive for wound (inside of upper lip).  Neurological: Negative for syncope.    A complete 10 system review of systems was obtained and all systems are negative except as noted in the HPI and PMH.    Allergies  Avelox; Compazine; Ibuprofen; Morphine and related; and Promethazine hcl  Home Medications   Prior to Admission medications   Medication Sig Start Date End Date Taking? Authorizing Provider  amLODipine (NORVASC) 10 MG tablet Take one tablet by mouth once daily for blood pressure 03/22/14  Yes Mahima Bubba Camp, MD  lisinopril (PRINIVIL,ZESTRIL) 20 MG tablet take 1 tablet by mouth once daily 04/20/14  Yes Tiffany L Reed, DO  LORazepam (ATIVAN) 0.5 MG tablet Take one tablet every 6 hours as needed for anxiety 04/03/14  Yes Tiffany L Reed, DO  metoprolol tartrate (LOPRESSOR) 25 MG tablet Take 1 tablet (25 mg total) by mouth 2 (two) times daily. 03/02/14  Yes Tiffany L Reed, DO  omeprazole (PRILOSEC) 20 MG capsule Take 1 capsule (20 mg total) by mouth daily. 03/02/14  Yes Tiffany L Reed, DO  OxyCODONE (OXYCONTIN) 10 mg  T12A 12 hr tablet Take one tablet by mouth every 12 hours for pain 04/20/14  Yes Tiffany L Reed, DO  simvastatin (ZOCOR) 10 MG tablet Take one tablet by mouth at bedtime for cholesterol 01/10/14  Yes Mahima Pandey, MD  zolpidem (AMBIEN) 10 MG tablet Take 1 tablet (10 mg total) by mouth at bedtime as needed. For sleep 04/17/14  Yes Tiffany L Reed, DO   BP 156/95  Pulse 110  Temp(Src) 98.9 F (37.2 C) (Oral)  Resp 18  SpO2 96%  Physical Exam  Nursing note and vitals reviewed. Constitutional: She is oriented to person, place, and time. She appears well-developed and well-nourished. No distress.  HENT:  Head: Normocephalic and atraumatic.  Mouth/Throat: Oropharynx is clear and moist.  Minimal swelling to upper lip on left. No laceration or bleeding. No dental trauma.  Eyes: Conjunctivae and EOM are normal.   Neck: Normal range of motion. Neck supple.  Cardiovascular: Normal rate, normal heart sounds and intact distal pulses.   Tachycardic. +2 radial pulse on left.  Pulmonary/Chest: Effort normal and breath sounds normal. No respiratory distress. She has no decreased breath sounds.  Musculoskeletal: Normal range of motion. She exhibits tenderness. She exhibits no edema.  Tender to palpation over left humerus, more so proximally. No swelling, deformity, or bruising. Minimal left shoulder tenderness. ROM left shoulder limited by pain. Tender to palpation left lower anterio-lateral ribs. No crepitus or step-offs or bruising.   Neurological: She is alert and oriented to person, place, and time. No sensory deficit.  Sensation intact.  Skin: Skin is warm and dry.  No bruising or signs of trauma  Psychiatric: She has a normal mood and affect. Her behavior is normal.    ED Course  Procedures (including critical care time) DIAGNOSTIC STUDIES: Oxygen Saturation is 96% on room air, normal by my interpretation.    COORDINATION OF CARE: 1:58 PM-Discussed treatment plan which includes X-ray of left humerus and  Ribs unilateral with chest with pt at bedside and pt agreed to plan.   Labs Review Labs Reviewed - No data to display  Imaging Review Dg Ribs Unilateral W/chest Left  04/23/2014   CLINICAL DATA:  Fall, left side pain  EXAM: LEFT RIBS AND CHEST - 3+ VIEW  COMPARISON:  Acute abdominal series 12 09/14/2011  FINDINGS: Cardiac and mediastinal contours are within normal limits. Atherosclerotic calcifications present within the transverse aorta. The lungs are clear. No pleural effusion, pneumothorax or focal airspace consolidation. Trace linear opacities in the left lower lung likely reflect areas of subsegmental atelectasis. Unremarkable visualized upper abdominal bowel gas pattern. Surgical clips in the right upper quadrant suggest prior cholecystectomy. No acute osseous abnormality.  IMPRESSION: 1.  Although no displaced rib fracture is identified, there is evidence of subsegmental atelectasis and perhaps a trace effusion versus subpleural thickening in the left lower lobe. Findings raise suspicion for possible occult or nondisplaced rib fracture and associated splinting. 2. Otherwise, no acute cardiopulmonary process. 3. Aortic atherosclerosis.   Electronically Signed   By: Jacqulynn Cadet M.D.   On: 04/23/2014 15:23   Dg Humerus Left  04/23/2014   CLINICAL DATA:  Fall.  Shoulder pain.  EXAM: LEFT HUMERUS - 2+ VIEW  COMPARISON:  None.  FINDINGS: There is a fracture through the lateral humeral head/ neck in the region of the greater tuberosity. No visible fracture crossing the humeral neck. No subluxation or dislocation.  IMPRESSION: Fracture in the region of the greater tuberosity laterally. No visible fracture across in the  humeral neck.   Electronically Signed   By: Rolm Baptise M.D.   On: 04/23/2014 15:21     EKG Interpretation None      MDM   Final diagnoses:  Fall, initial encounter  Greater tuberosity of humerus fracture, left, closed, initial encounter    Pt presenting with left arm and rib pain after fall. Well appearing and in NAD. Tachycardic, vitals otherwise stable. Breath sounds normal. No respiratory distress. No SOB. Neurovascularly intact. Humerus fracture showing fracture through lateral humeral head/neck in the region of greater tuberosity. No visible fracture crossing humeral neck. Rib xray showing evidence of subsegmental atelectasis and trace effusion vs subpleural thickening in LLL. Possible occult or non-displaced rib fracture and splinting. Sling applied, incentive spirometer given. She has pain medication at home. She will f/u with both her PCP and ortho. Stable for d/c. Return precautions given. Patient states understanding of treatment care plan and is agreeable.  I personally performed the services described in this documentation, which was scribed in my  presence. The recorded information has been reviewed and is accurate.    Illene Labrador, PA-C 04/23/14 [phone]

## 2014-04-23 NOTE — ED Provider Notes (Signed)
Medical screening examination/treatment/procedure(s) were performed by non-physician practitioner and as supervising physician I was immediately available for consultation/collaboration.   EKG Interpretation None        Orpah Greek, MD 04/23/14 [phone]

## 2014-04-23 NOTE — Discharge Instructions (Signed)
Take the pain medicine that you have at home for severe pain. Wear the sling until you followup with orthopedics. Use the incentive spirometer intermittently throughout the day as directed.   Humerus Fracture, Treated with Immobilization The humerus is the large bone in your upper arm. You have a broken (fractured) humerus. These fractures are easily diagnosed with X-rays. TREATMENT  Simple fractures which will heal without disability are treated with simple immobilization. Immobilization means you will wear a cast, splint, or sling. You have a fracture which will do well with immobilization. The fracture will heal well simply by being held in a good position until it is stable enough to begin range of motion exercises. Do not take part in activities which would further injure your arm.  HOME CARE INSTRUCTIONS   Put ice on the injured area.  Put ice in a plastic bag.  Place a towel between your skin and the bag.  Leave the ice on for 15-20 minutes, 03-04 times a day.  If you have a cast:  Do not scratch the skin under the cast using sharp or pointed objects.  Check the skin around the cast every day. You may put lotion on any red or sore areas.  Keep your cast dry and clean.  If you have a splint:  Wear the splint as directed.  Keep your splint dry and clean.  You may loosen the elastic around the splint if your fingers become numb, tingle, or turn cold or blue.  If you have a sling:  Wear the sling as directed.  Do not put pressure on any part of your cast or splint until it is fully hardened.  Your cast or splint can be protected during bathing with a plastic bag. Do not lower the cast or splint into water.  Only take over-the-counter or prescription medicines for pain, discomfort, or fever as directed by your caregiver.  Do range of motion exercises as instructed by your caregiver.  Follow up as directed by your caregiver. This is very important in order to avoid  permanent injury or disability and chronic pain. SEEK IMMEDIATE MEDICAL CARE IF:   Your skin or nails in the injured arm turn blue or gray.  Your arm feels cold or numb.  You develop severe pain in the injured arm.  You are having problems with the medicines you were given. MAKE SURE YOU:   Understand these instructions.  Will watch your condition.  Will get help right away if you are not doing well or get worse. Document Released: 11/17/2000 Document Revised: 11/03/2011 Document Reviewed: 09/25/2010 [facility] Patient Information 2015 [place], [place]. This information is not intended to replace advice given to you by your health care provider. Make sure you discuss any questions you have with your health care provider. Rib Fracture A rib fracture is a break or crack in one of the bones of the ribs. The ribs are a group of long, curved bones that wrap around your chest and attach to your spine. They protect your lungs and other organs in the chest cavity. A broken or cracked rib is often painful, but most do not cause other problems. Most rib fractures heal on their own over time. However, rib fractures can be more serious if multiple ribs are broken or if broken ribs move out of place and push against other structures. CAUSES   A direct blow to the chest. For example, this could happen during contact sports, a car accident, or a fall against  a hard object.  Repetitive movements with high force, such as pitching a baseball or having severe coughing spells. SYMPTOMS   Pain when you breathe in or cough.  Pain when someone presses on the injured area. DIAGNOSIS  Your caregiver will perform a physical exam. Various imaging tests may be ordered to confirm the diagnosis and to look for related injuries. These tests may include a chest X-ray, computed tomography (CT), magnetic resonance imaging (MRI), or a bone scan. TREATMENT  Rib fractures usually heal on their own in 1-3 months. The  longer healing period is often associated with a continued cough or other aggravating activities. During the healing period, pain control is very important. Medication is usually given to control pain. Hospitalization or surgery may be needed for more severe injuries, such as those in which multiple ribs are broken or the ribs have moved out of place.  HOME CARE INSTRUCTIONS   Avoid strenuous activity and any activities or movements that cause pain. Be careful during activities and avoid bumping the injured rib.  Gradually increase activity as directed by your caregiver.  Only take over-the-counter or prescription medications as directed by your caregiver. Do not take other medications without asking your caregiver first.  Apply ice to the injured area for the first 1-2 days after you have been treated or as directed by your caregiver. Applying ice helps to reduce inflammation and pain.  Put ice in a plastic bag.  Place a towel between your skin and the bag.   Leave the ice on for 15-20 minutes at a time, every 2 hours while you are awake.  Perform deep breathing as directed by your caregiver. This will help prevent pneumonia, which is a common complication of a broken rib. Your caregiver may instruct you to:  Take deep breaths several times a day.  Try to cough several times a day, holding a pillow against the injured area.  Use a device called an incentive spirometer to practice deep breathing several times a day.  Drink enough fluids to keep your urine clear or pale yellow. This will help you avoid constipation.   Do not wear a rib belt or binder. These restrict breathing, which can lead to pneumonia.  SEEK IMMEDIATE MEDICAL CARE IF:   You have a fever.   You have difficulty breathing or shortness of breath.   You develop a continual cough, or you cough up thick or bloody sputum.  You feel sick to your stomach (nausea), throw up (vomit), or have abdominal pain.   You  have worsening pain not controlled with medications.  MAKE SURE YOU:  Understand these instructions.  Will watch your condition.  Will get help right away if you are not doing well or get worse. Document Released: 08/11/2005 Document Revised: 04/13/2013 Document Reviewed: 10/13/2012 [facility] Patient Information 2015 [place], [place]. This information is not intended to replace advice given to you by your health care provider. Make sure you discuss any questions you have with your health care provider.  Incentive Spirometer An incentive spirometer is a tool that can help keep your lungs clear and active. This tool measures how well you are filling your lungs with each breath. Taking long, deep breaths may help reverse or decrease the chance of developing breathing (pulmonary) problems (especially infection) following:  Surgery of the chest or abdomen.  Surgery if you have a history of smoking or a lung problem.  A long period of time when you are unable to move or be  active. BEFORE THE PROCEDURE   If the spirometer includes an indicator to show your best effort, your nurse or respiratory therapist will set it to a desired goal.  If possible, sit up straight or lean slightly forward. Try not to slouch.  Hold the incentive spirometer in an upright position. INSTRUCTIONS FOR USE  1. Sit on the edge of your bed if possible, or sit up as far as you can in bed or on a chair. 2. Hold the incentive spirometer in an upright position. 3. Breathe out normally. 4. Place the mouthpiece in your mouth and seal your lips tightly around it. 5. Breathe in slowly and as deeply as possible, raising the piston or the ball toward the top of the column. 6. Hold your breath for 3-5 seconds or for as long as possible. Allow the piston or ball to fall to the bottom of the column. 7. Remove the mouthpiece from your mouth and breathe out normally. 8. Rest for a few seconds and repeat Steps 1 through 7 at least  10 times every 1-2 hours when you are awake. Take your time and take a few normal breaths between deep breaths. 9. The spirometer may include an indicator to show your best effort. Use the indicator as a goal to work toward during each repetition. 10. After each set of 10 deep breaths, practice coughing to be sure your lungs are clear. If you have an incision (the cut made at the time of surgery), support your incision when coughing by placing a pillow or rolled-up towels firmly against it. Once you are able to get out of bed, walk around indoors and cough well. You may stop using the incentive spirometer when instructed by your caregiver.  RISKS AND COMPLICATIONS  Breathing too quickly may cause dizziness. At an extreme, this could cause you to pass out. Take your time so you do not get dizzy or light-headed.  If you are in pain, you may need to take or ask for pain medication before doing incentive spirometry. It is harder to take a deep breath if you are having pain. AFTER USE  Rest and breathe slowly and easily.  It can be helpful to keep a log of your progress. Your caregiver can provide you with a simple table to help with this. If you are using the spirometer at home, follow these instructions: [place] IF:   You are having difficultly using the spirometer.  You have trouble using the spirometer as often as instructed.  Your pain medication is not giving enough relief while using the spirometer.  You develop fever of 100.94F (38.1C) or higher. SEEK IMMEDIATE MEDICAL CARE IF:   You cough up bloody sputum that had not been present before.  You develop fever of 102F (38.9C) or greater.  You develop worsening pain at or near the incision site. MAKE SURE YOU:   Understand these instructions.  Will watch your condition.  Will get help right away if you are not doing well or get worse. Document Released: 12/22/2006 Document Revised: 12/26/2013 Document Reviewed:  02/22/2007 [facility] Patient Information 2015 [place], [place]. This information is not intended to replace advice given to you by your health care provider. Make sure you discuss any questions you have with your health care provider.

## 2014-04-23 NOTE — ED Notes (Signed)
Pt reports slipping on water and falling this am, has laceration to inside of upper lip, no bleeding noted at this time. Also has pain to left upper arm, +radial pulse, no obv injury noted

## 2014-04-24 ENCOUNTER — Telehealth: Payer: Self-pay | Admitting: *Deleted

## 2014-04-24 NOTE — Telephone Encounter (Signed)
Patient called and stated that she fell in her kitchen over the weekend and broke her humerus and possible rib fracture. She wanted an increase in her pain medication. Informed patient that she needed a hospital follow up first. Scheduled an appointment for Thursday with Dr. Mariea Clonts. Patient agreed.

## 2014-04-27 ENCOUNTER — Ambulatory Visit: Payer: Self-pay | Admitting: Internal Medicine

## 2014-05-02 ENCOUNTER — Other Ambulatory Visit: Payer: Self-pay | Admitting: Orthopaedic Surgery

## 2014-05-02 DIAGNOSIS — S62109A Fracture of unspecified carpal bone, unspecified wrist, initial encounter for closed fracture: Secondary | ICD-10-CM

## 2014-05-04 ENCOUNTER — Other Ambulatory Visit: Payer: Self-pay | Admitting: *Deleted

## 2014-05-04 DIAGNOSIS — F411 Generalized anxiety disorder: Secondary | ICD-10-CM

## 2014-05-04 MED ORDER — LORAZEPAM 0.5 MG PO TABS: ORAL_TABLET | ORAL | Status: DC

## 2014-05-04 NOTE — Telephone Encounter (Signed)
Patient Requested and called in medication to pharmacist.

## 2014-05-19 ENCOUNTER — Other Ambulatory Visit: Payer: Self-pay | Admitting: *Deleted

## 2014-05-19 MED ORDER — OXYCODONE HCL ER 10 MG PO T12A: EXTENDED_RELEASE_TABLET | ORAL | Status: DC

## 2014-05-19 NOTE — Telephone Encounter (Signed)
Patient Requested and will pick up 

## 2014-05-23 ENCOUNTER — Other Ambulatory Visit: Payer: Self-pay | Admitting: Internal Medicine

## 2014-05-25 ENCOUNTER — Other Ambulatory Visit: Payer: Self-pay | Admitting: *Deleted

## 2014-05-25 MED ORDER — ZOLPIDEM TARTRATE 10 MG PO TABS: 10.0000 mg | ORAL_TABLET | Freq: Every evening | ORAL | Status: DC | PRN

## 2014-05-25 NOTE — Telephone Encounter (Signed)
[facility] 

## 2014-05-26 ENCOUNTER — Other Ambulatory Visit: Payer: Self-pay | Admitting: *Deleted

## 2014-05-26 MED ORDER — ZOLPIDEM TARTRATE 10 MG PO TABS: 10.0000 mg | ORAL_TABLET | Freq: Every evening | ORAL | Status: DC | PRN

## 2014-05-26 NOTE — Telephone Encounter (Signed)
Patient Requested and faxed to pharmacy. 

## 2014-06-09 ENCOUNTER — Other Ambulatory Visit: Payer: Self-pay | Admitting: *Deleted

## 2014-06-09 DIAGNOSIS — F411 Generalized anxiety disorder: Secondary | ICD-10-CM

## 2014-06-09 MED ORDER — LORAZEPAM 0.5 MG PO TABS: ORAL_TABLET | ORAL | Status: DC

## 2014-06-09 NOTE — Telephone Encounter (Signed)
[facility] 

## 2014-06-19 ENCOUNTER — Other Ambulatory Visit: Payer: Self-pay | Admitting: *Deleted

## 2014-06-19 MED ORDER — OXYCODONE HCL ER 10 MG PO T12A: EXTENDED_RELEASE_TABLET | ORAL | Status: DC

## 2014-06-19 NOTE — Telephone Encounter (Signed)
Patient Requested and will pick up 

## 2014-07-07 ENCOUNTER — Other Ambulatory Visit: Payer: Self-pay | Admitting: *Deleted

## 2014-07-07 MED ORDER — ZOLPIDEM TARTRATE 10 MG PO TABS: 10.0000 mg | ORAL_TABLET | Freq: Every evening | ORAL | Status: DC | PRN

## 2014-07-07 NOTE — Telephone Encounter (Signed)
[facility] 

## 2014-07-18 ENCOUNTER — Other Ambulatory Visit: Payer: Self-pay

## 2014-07-18 MED ORDER — OXYCODONE HCL ER 10 MG PO T12A: EXTENDED_RELEASE_TABLET | ORAL | Status: DC

## 2014-07-18 NOTE — Telephone Encounter (Signed)
Left message on voicemail informing patient rx will be ready in the am

## 2014-07-27 ENCOUNTER — Other Ambulatory Visit: Payer: Self-pay | Admitting: *Deleted

## 2014-07-27 DIAGNOSIS — F411 Generalized anxiety disorder: Secondary | ICD-10-CM

## 2014-07-27 MED ORDER — LORAZEPAM 0.5 MG PO TABS
ORAL_TABLET | ORAL | Status: DC
Start: 2014-07-27 — End: 2014-09-04

## 2014-07-27 NOTE — Telephone Encounter (Signed)
Patient called and requested Rx to be faxed to pharmacy. Printed and faxed.

## 2014-07-28 ENCOUNTER — Other Ambulatory Visit: Payer: Self-pay | Admitting: *Deleted

## 2014-07-28 ENCOUNTER — Other Ambulatory Visit: Payer: Self-pay | Admitting: Internal Medicine

## 2014-07-28 DIAGNOSIS — R1011 Right upper quadrant pain: Principal | ICD-10-CM

## 2014-07-28 DIAGNOSIS — G8929 Other chronic pain: Secondary | ICD-10-CM

## 2014-07-28 MED ORDER — OMEPRAZOLE 20 MG PO CPDR: DELAYED_RELEASE_CAPSULE | ORAL | Status: DC

## 2014-07-28 NOTE — Telephone Encounter (Signed)
[facility] 

## 2014-08-09 ENCOUNTER — Other Ambulatory Visit: Payer: Self-pay | Admitting: *Deleted

## 2014-08-09 MED ORDER — ZOLPIDEM TARTRATE 10 MG PO TABS: 10.0000 mg | ORAL_TABLET | Freq: Every evening | ORAL | Status: DC | PRN

## 2014-08-09 NOTE — Telephone Encounter (Signed)
[facility] 

## 2014-08-15 ENCOUNTER — Other Ambulatory Visit: Payer: Self-pay | Admitting: *Deleted

## 2014-08-15 MED ORDER — OXYCODONE HCL ER 10 MG PO T12A: EXTENDED_RELEASE_TABLET | ORAL | Status: DC

## 2014-08-15 NOTE — Telephone Encounter (Signed)
Patient Requested and will pick up 

## 2014-08-31 ENCOUNTER — Ambulatory Visit (INDEPENDENT_AMBULATORY_CARE_PROVIDER_SITE_OTHER): Payer: PRIVATE HEALTH INSURANCE | Admitting: Internal Medicine

## 2014-08-31 ENCOUNTER — Encounter: Payer: Self-pay | Admitting: Internal Medicine

## 2014-08-31 VITALS — BP 150/86 | HR 83 | Temp 98.5°F | Resp 20 | Ht 65.0 in | Wt 154.4 lb

## 2014-08-31 DIAGNOSIS — R739 Hyperglycemia, unspecified: Secondary | ICD-10-CM

## 2014-08-31 DIAGNOSIS — Z23 Encounter for immunization: Secondary | ICD-10-CM | POA: Diagnosis not present

## 2014-08-31 DIAGNOSIS — I10 Essential (primary) hypertension: Secondary | ICD-10-CM

## 2014-08-31 DIAGNOSIS — R1010 Upper abdominal pain, unspecified: Secondary | ICD-10-CM | POA: Diagnosis not present

## 2014-08-31 DIAGNOSIS — M8580 Other specified disorders of bone density and structure, unspecified site: Secondary | ICD-10-CM

## 2014-08-31 DIAGNOSIS — R1011 Right upper quadrant pain: Secondary | ICD-10-CM

## 2014-08-31 DIAGNOSIS — Z720 Tobacco use: Secondary | ICD-10-CM

## 2014-08-31 DIAGNOSIS — G8929 Other chronic pain: Secondary | ICD-10-CM

## 2014-08-31 DIAGNOSIS — F17200 Nicotine dependence, unspecified, uncomplicated: Secondary | ICD-10-CM

## 2014-08-31 MED ORDER — PNEUMOCOCCAL 13-VAL CONJ VACC IM SUSP
0.5000 mL | Freq: Once | INTRAMUSCULAR | Status: AC
  Administered 2014-08-31: 0.5 mL via INTRAMUSCULAR

## 2014-08-31 MED ORDER — VITAMIN D 50 MCG (2000 UT) PO CAPS: 1.0000 | ORAL_CAPSULE | Freq: Every day | ORAL | Status: AC

## 2014-08-31 NOTE — Addendum Note (Signed)
Addended by: Eilene Ghazi on: 08/31/2014 09:08 AM   Modules accepted: Orders

## 2014-08-31 NOTE — Progress Notes (Signed)
Patient ID: Theresa Carey, female   DOB: 23-Nov-1955, 59 y.o.   MRN: [id number]   Location:  [facility]  Allergies  Allergen Reactions  . Avelox [Moxifloxacin Hcl In Nacl]     Low blood pressure  . Compazine [Prochlorperazine Edisylate] Other (See Comments)    Caused lockjaw  . Ibuprofen Hives  . Morphine And Related Nausea And Vomiting  . Promethazine Hcl Nausea And Vomiting    Chief Complaint  Patient presents with  . Medical Management of Chronic Issues    HPI: Patient is a 59 y.o. white female seen in the office today for med mgt of chronic diseases.    Broke humerus left and rib when fell on dishwater.  Left arm range of motion is still not full.  Has some discomfort in the arm but improved.  Nausea and vomiting here and there with RUQ pain  Review of Systems:  Review of Systems  Constitutional: Negative for fever, chills and malaise/fatigue.  HENT: Negative for congestion.   Eyes: Negative for blurred vision.  Respiratory: Negative for wheezing.   Cardiovascular: Negative for chest pain and leg swelling.  Gastrointestinal: Positive for nausea, vomiting and abdominal pain. Negative for constipation, blood in stool and melena.  Genitourinary: Negative for dysuria.  Musculoskeletal: Negative for myalgias and falls.       Recent left humerus fracture  Skin: Negative for rash.  Neurological: Negative for dizziness and weakness.  Psychiatric/Behavioral: Negative for depression and memory loss. The patient is nervous/anxious.      Past Medical History  Diagnosis Date  . Hypertension   . Abdominal pain 2000    chronic abdominal pain with nausea and vomiting  . Allergy   . Abnormal liver function     Past Surgical History  Procedure Laterality Date  . Abdominal hysterectomy    . Cholecystectomy      Social History:   reports that she has been smoking Cigarettes.  She has been smoking about 0.50 packs per day. She has  never used smokeless tobacco. She reports that she drinks alcohol. She reports that she does not use illicit drugs.  Family History  Problem Relation Age of Onset  . Hypertension Father   . Cancer - Other Father     Bladder  . Hypertension Mother   . Diabetes Maternal Grandmother   . Cancer - Other Maternal Grandmother     Pancreatic  . Cancer - Other Paternal Grandmother     Bone  . Cancer Father   . Cancer Father   . Cancer Father     Medications: Patient's Medications  New Prescriptions   No medications on file  Previous Medications   AMLODIPINE (NORVASC) 10 MG TABLET    take 1 tablet by mouth once daily   LISINOPRIL (PRINIVIL,ZESTRIL) 20 MG TABLET    take 1 tablet by mouth once daily   LORAZEPAM (ATIVAN) 0.5 MG TABLET    Take one tablet every 6 hours as needed for anxiety   METOPROLOL TARTRATE (LOPRESSOR) 25 MG TABLET    Take 1 tablet (25 mg total) by mouth 2 (two) times daily.   OMEPRAZOLE (PRILOSEC) 20 MG CAPSULE    Take one capsule by mouth once daily for stomach   OXYCODONE (OXYCONTIN) 10 MG T12A 12 HR TABLET    Take one tablet by mouth every 12 hours for pain   SIMVASTATIN (ZOCOR) 10 MG TABLET    take 1 tablet by mouth at bedtime FOR  CHOLESTEROL.   ZOLPIDEM (AMBIEN) 10 MG TABLET    Take 1 tablet (10 mg total) by mouth at bedtime as needed. For sleep  Modified Medications   No medications on file  Discontinued Medications   No medications on file     Physical Exam: Filed Vitals:   08/31/14 0742  BP: 150/86  Pulse: 83  Temp: 98.5 F (36.9 C)  TempSrc: Oral  Resp: 20  Height: 5\' 5"  (1.651 m)  Weight: 154 lb 6.4 oz (70.035 kg)  SpO2: 93%  Physical Exam  Constitutional: She appears well-developed and well-nourished. No distress.  Cardiovascular: Regular rhythm and normal heart sounds.   Mild tachy  Pulmonary/Chest: Effort normal and breath sounds normal.  Abdominal: Bowel sounds are normal.  Musculoskeletal: She exhibits tenderness.  Left upper arm with  slight decrease of abduction  Skin: Skin is warm and dry.  Psychiatric: She has a normal mood and affect.    Labs reviewed: Basic Metabolic Panel:  Recent Labs  03/02/14 0823  NA 141  K 4.4  CL 103  CO2 25  GLUCOSE 88  BUN 14  CREATININE 0.68  CALCIUM 9.0   Liver Function Tests:  Recent Labs  03/02/14 0823  AST 16  ALT 68*  ALKPHOS 124*  BILITOT 0.2  PROT 6.3  CBC:  Recent Labs  03/02/14 0823  WBC 6.3  NEUTROABS 2.7  HGB 12.0  HCT 37.0  MCV 96  PLT 349   Lipid Panel:  Recent Labs  03/02/14 0823  HDL 44  LDLCALC 88  TRIG 114  CHOLHDL 3.5   Lab Results  Component Value Date   HGBA1C 5.8* 03/02/2014    Past Procedures: DEXA scan:    Assessment/Plan 1. Osteopenia - encouraged her to walk more and quit smoking -start vitamin D daily - Cholecalciferol (VITAMIN D) 2000 UNITS CAPS; Take 1 capsule (2,000 Units total) by mouth daily.  Dispense: 30 capsule; Refill: 5  2. Chronic RUQ pain -continues with pain, nausea, vomiting at times -continue current regimen  3. Tobacco use disorder -encouraged cessation  4. Essential hypertension -bp not at goal in daytime, but is at night when she is at work -will have her take norvasc in am along with the first metoprolol and lisinopril with the second at night  5. Hyperglycemia -recheck hba1c and lipids at her next visit--advised to come fasting  6. Need for vaccination with 13-polyvalent pneumococcal conjugate vaccine -given today  Did not get mammo--plans to before next appt  Labs/tests ordered:  None ordered this am Next appt:  6 mos come fasting for labs  Aimar Shrewsbury L. Marguerite Jarboe, D.O. [facility] [address], [place] [address] Cell Phone (Mon-Fri 8am-5pm):  [phone] On Call:  [phone] & follow prompts after 5pm & weekends Office Phone:  [phone] Office Fax:  [phone]

## 2014-09-04 ENCOUNTER — Other Ambulatory Visit: Payer: Self-pay

## 2014-09-04 DIAGNOSIS — F411 Generalized anxiety disorder: Secondary | ICD-10-CM

## 2014-09-04 MED ORDER — LORAZEPAM 0.5 MG PO TABS: ORAL_TABLET | ORAL | Status: DC

## 2014-09-04 NOTE — Telephone Encounter (Signed)
Patient called triage line requesting refill. Patient was informed that future refill request need to be called into the pharmacy, patient verbalized understanding. RX was called into [facility] on file

## 2014-09-13 ENCOUNTER — Other Ambulatory Visit: Payer: Self-pay | Admitting: *Deleted

## 2014-09-13 MED ORDER — OXYCODONE HCL ER 10 MG PO T12A: EXTENDED_RELEASE_TABLET | ORAL | Status: DC

## 2014-09-13 NOTE — Telephone Encounter (Signed)
Patient Requested and will pick up 

## 2014-09-14 ENCOUNTER — Other Ambulatory Visit: Payer: Self-pay | Admitting: *Deleted

## 2014-09-14 MED ORDER — ZOLPIDEM TARTRATE 10 MG PO TABS: 10.0000 mg | ORAL_TABLET | Freq: Every evening | ORAL | Status: DC | PRN

## 2014-09-14 NOTE — Telephone Encounter (Signed)
Patient called and requested refill. Spoke with pharmacist and called in.

## 2014-10-01 ENCOUNTER — Other Ambulatory Visit: Payer: Self-pay | Admitting: Internal Medicine

## 2014-10-11 ENCOUNTER — Other Ambulatory Visit: Payer: Self-pay

## 2014-10-11 DIAGNOSIS — F411 Generalized anxiety disorder: Secondary | ICD-10-CM

## 2014-10-11 MED ORDER — LORAZEPAM 0.5 MG PO TABS: ORAL_TABLET | ORAL | Status: DC

## 2014-10-11 NOTE — Telephone Encounter (Signed)
Called in rx to pharmacy.

## 2014-10-16 ENCOUNTER — Other Ambulatory Visit: Payer: Self-pay | Admitting: *Deleted

## 2014-10-16 MED ORDER — ZOLPIDEM TARTRATE 10 MG PO TABS: 10.0000 mg | ORAL_TABLET | Freq: Every evening | ORAL | Status: DC | PRN

## 2014-10-16 MED ORDER — OXYCODONE HCL ER 10 MG PO T12A: EXTENDED_RELEASE_TABLET | ORAL | Status: DC

## 2014-10-16 NOTE — Telephone Encounter (Signed)
Patient Requested and will pick up 

## 2014-10-31 ENCOUNTER — Other Ambulatory Visit: Payer: Self-pay | Admitting: *Deleted

## 2014-10-31 MED ORDER — LISINOPRIL 20 MG PO TABS: ORAL_TABLET | ORAL | Status: DC

## 2014-10-31 NOTE — Telephone Encounter (Signed)
[facility]

## 2014-11-08 ENCOUNTER — Other Ambulatory Visit: Payer: Self-pay

## 2014-11-08 DIAGNOSIS — F411 Generalized anxiety disorder: Secondary | ICD-10-CM

## 2014-11-08 MED ORDER — LORAZEPAM 0.5 MG PO TABS: ORAL_TABLET | ORAL | Status: DC

## 2014-11-08 NOTE — Telephone Encounter (Signed)
Rx manually faxed

## 2014-11-14 ENCOUNTER — Other Ambulatory Visit: Payer: Self-pay | Admitting: *Deleted

## 2014-11-14 MED ORDER — OXYCODONE HCL ER 10 MG PO T12A: EXTENDED_RELEASE_TABLET | ORAL | Status: DC

## 2014-11-14 NOTE — Telephone Encounter (Signed)
Patient requested and will pick up 

## 2014-11-16 ENCOUNTER — Other Ambulatory Visit: Payer: Self-pay | Admitting: *Deleted

## 2014-11-16 MED ORDER — ZOLPIDEM TARTRATE 10 MG PO TABS: 10.0000 mg | ORAL_TABLET | Freq: Every evening | ORAL | Status: DC | PRN

## 2014-11-16 NOTE — Telephone Encounter (Signed)
[facility]

## 2014-11-30 ENCOUNTER — Other Ambulatory Visit: Payer: Self-pay | Admitting: *Deleted

## 2014-11-30 DIAGNOSIS — R1011 Right upper quadrant pain: Principal | ICD-10-CM

## 2014-11-30 DIAGNOSIS — G8929 Other chronic pain: Secondary | ICD-10-CM

## 2014-11-30 MED ORDER — OMEPRAZOLE 20 MG PO CPDR: DELAYED_RELEASE_CAPSULE | ORAL | Status: DC

## 2014-11-30 MED ORDER — AMLODIPINE BESYLATE 10 MG PO TABS: 10.0000 mg | ORAL_TABLET | Freq: Every day | ORAL | Status: DC

## 2014-11-30 NOTE — Telephone Encounter (Signed)
Patient requested. Faxed to pharmacy.  

## 2014-12-08 ENCOUNTER — Other Ambulatory Visit: Payer: Self-pay

## 2014-12-08 DIAGNOSIS — F411 Generalized anxiety disorder: Secondary | ICD-10-CM

## 2014-12-08 MED ORDER — LORAZEPAM 0.5 MG PO TABS: ORAL_TABLET | ORAL | Status: DC

## 2014-12-08 NOTE — Telephone Encounter (Signed)
[facility] refill request; last refill 11/08/14, last seen 08/31/14, next appt 03/01/15

## 2014-12-13 ENCOUNTER — Other Ambulatory Visit: Payer: Self-pay | Admitting: *Deleted

## 2014-12-13 MED ORDER — OXYCODONE HCL ER 10 MG PO T12A: EXTENDED_RELEASE_TABLET | ORAL | Status: DC

## 2014-12-19 ENCOUNTER — Other Ambulatory Visit: Payer: Self-pay

## 2014-12-19 DIAGNOSIS — Z1231 Encounter for screening mammogram for malignant neoplasm of breast: Secondary | ICD-10-CM

## 2014-12-22 ENCOUNTER — Other Ambulatory Visit: Payer: Self-pay | Admitting: *Deleted

## 2014-12-22 MED ORDER — ZOLPIDEM TARTRATE 10 MG PO TABS: 10.0000 mg | ORAL_TABLET | Freq: Every evening | ORAL | Status: DC | PRN

## 2014-12-22 NOTE — Telephone Encounter (Signed)
Theresa Carey

## 2015-01-12 ENCOUNTER — Other Ambulatory Visit: Payer: Self-pay | Admitting: *Deleted

## 2015-01-12 MED ORDER — OXYCODONE HCL ER 10 MG PO T12A: EXTENDED_RELEASE_TABLET | ORAL | Status: DC

## 2015-01-12 NOTE — Telephone Encounter (Signed)
Patient Requested and will pick up 

## 2015-01-17 LAB — HM MAMMOGRAPHY

## 2015-01-18 ENCOUNTER — Encounter: Payer: Self-pay | Admitting: *Deleted

## 2015-01-18 ENCOUNTER — Other Ambulatory Visit: Payer: Self-pay | Admitting: *Deleted

## 2015-01-18 MED ORDER — ZOLPIDEM TARTRATE 10 MG PO TABS: 10.0000 mg | ORAL_TABLET | Freq: Every evening | ORAL | Status: DC | PRN

## 2015-01-18 NOTE — Telephone Encounter (Signed)
[facility]

## 2015-01-19 ENCOUNTER — Telehealth: Payer: Self-pay | Admitting: *Deleted

## 2015-01-19 NOTE — Telephone Encounter (Signed)
Received fax Mammogram report from [facility]. Impression: Incomplete-need additional imaging evaluation and/or prior mammograms for comparison. The irregular focal asymmetry in the left breast at 1 o'clock posterior depth is indeterminate. An ultrasound is recommended. The 15mm intramammary node in the left breast at 7 o'clock anterior depth is indeterminate. An ultrasound is recommended.  Per Dr. Ernestina Penna patient and inform her of results. Patient notified and agreed and will contact [facility] for further instructions.

## 2015-01-25 ENCOUNTER — Other Ambulatory Visit: Payer: Self-pay | Admitting: *Deleted

## 2015-01-25 DIAGNOSIS — F411 Generalized anxiety disorder: Secondary | ICD-10-CM

## 2015-01-25 MED ORDER — LORAZEPAM 0.5 MG PO TABS: ORAL_TABLET | ORAL | Status: DC

## 2015-01-25 NOTE — Telephone Encounter (Signed)
[facility]

## 2015-02-09 ENCOUNTER — Other Ambulatory Visit: Payer: Self-pay | Admitting: *Deleted

## 2015-02-09 MED ORDER — SIMVASTATIN 10 MG PO TABS: ORAL_TABLET | ORAL | Status: DC

## 2015-02-09 MED ORDER — OXYCODONE HCL ER 10 MG PO T12A: EXTENDED_RELEASE_TABLET | ORAL | Status: DC

## 2015-02-09 NOTE — Telephone Encounter (Signed)
Patient requested and will pick up 

## 2015-02-09 NOTE — Telephone Encounter (Signed)
[facility]

## 2015-02-13 ENCOUNTER — Encounter: Payer: Self-pay | Admitting: Internal Medicine

## 2015-02-19 ENCOUNTER — Other Ambulatory Visit: Payer: Self-pay | Admitting: *Deleted

## 2015-02-19 MED ORDER — ZOLPIDEM TARTRATE 10 MG PO TABS: 10.0000 mg | ORAL_TABLET | Freq: Every evening | ORAL | Status: DC | PRN

## 2015-02-19 NOTE — Telephone Encounter (Signed)
[facility]

## 2015-03-01 ENCOUNTER — Encounter: Payer: Self-pay | Admitting: Internal Medicine

## 2015-03-01 ENCOUNTER — Ambulatory Visit (INDEPENDENT_AMBULATORY_CARE_PROVIDER_SITE_OTHER): Payer: PRIVATE HEALTH INSURANCE | Admitting: Internal Medicine

## 2015-03-01 VITALS — BP 126/82 | HR 86 | Temp 97.6°F | Resp 18 | Ht 65.0 in | Wt 155.4 lb

## 2015-03-01 DIAGNOSIS — M8580 Other specified disorders of bone density and structure, unspecified site: Secondary | ICD-10-CM | POA: Diagnosis not present

## 2015-03-01 DIAGNOSIS — F17200 Nicotine dependence, unspecified, uncomplicated: Secondary | ICD-10-CM

## 2015-03-01 DIAGNOSIS — R1011 Right upper quadrant pain: Principal | ICD-10-CM

## 2015-03-01 DIAGNOSIS — R739 Hyperglycemia, unspecified: Secondary | ICD-10-CM

## 2015-03-01 DIAGNOSIS — R1010 Upper abdominal pain, unspecified: Secondary | ICD-10-CM | POA: Diagnosis not present

## 2015-03-01 DIAGNOSIS — Z13220 Encounter for screening for lipoid disorders: Secondary | ICD-10-CM | POA: Diagnosis not present

## 2015-03-01 DIAGNOSIS — G8929 Other chronic pain: Secondary | ICD-10-CM

## 2015-03-01 DIAGNOSIS — Z720 Tobacco use: Secondary | ICD-10-CM | POA: Diagnosis not present

## 2015-03-01 DIAGNOSIS — I10 Essential (primary) hypertension: Secondary | ICD-10-CM | POA: Diagnosis not present

## 2015-03-01 NOTE — Progress Notes (Signed)
Patient ID: Theresa Carey, female   DOB: Nov 11, 1955, 59 y.o.   MRN: [id number]   Location:  [facility]  Code Status: DNR per discussion today Goals of Care: Advanced Directive information Does patient have an advance directive?: No, Would patient like information on creating an advanced directive?: Yes - Educational materials given (says she doesn't want anything done if something bad happens to her)   Chief Complaint  Patient presents with  . Medical Management of Chronic Issues    6 month follow-up    HPI: Patient is a 59 y.o. white female seen in the office today for med mgt of her chronic illnesses including chronic right upper quadrant pain, htn, GERD, anxiety, insomnia and osteopenia.  She is doing well except her one week of n/v, abdominal pain.  She is stressed about some family issues as below  Says [place] [facility] [place] nights when it snowed and got rash again that resolved spontaneously in a day so she doesn't think she had scabies after all.  Review of Systems:  Review of Systems  Constitutional: Negative for fever, chills and malaise/fatigue.  HENT: Negative for hearing loss.   Eyes: Negative for blurred vision.  Respiratory: Negative for shortness of breath.   Cardiovascular: Negative for chest pain.  Gastrointestinal: Positive for heartburn, nausea, vomiting and abdominal pain.  Genitourinary: Negative for dysuria, urgency and frequency.  Musculoskeletal: Negative for myalgias and falls.  Skin: Negative for itching and rash.  Neurological: Negative for dizziness and weakness.  Psychiatric/Behavioral: Negative for memory loss. The patient is nervous/anxious and has insomnia.     Past Medical History  Diagnosis Date  . Hypertension   . Abdominal pain 2000    chronic abdominal pain with nausea and vomiting  . Allergy   . Abnormal liver function     Past Surgical History  Procedure Laterality Date  .  Abdominal hysterectomy    . Cholecystectomy      Allergies  Allergen Reactions  . Avelox [Moxifloxacin Hcl In Nacl]     Low blood pressure  . Compazine [Prochlorperazine Edisylate] Other (See Comments)    Caused lockjaw  . Ibuprofen Hives  . Morphine And Related Nausea And Vomiting  . Promethazine Hcl Nausea And Vomiting   Medications: Patient's Medications  New Prescriptions   No medications on file  Previous Medications   AMLODIPINE (NORVASC) 10 MG TABLET    Take 1 tablet (10 mg total) by mouth daily.   CHOLECALCIFEROL (VITAMIN D) 2000 UNITS CAPS    Take 1 capsule (2,000 Units total) by mouth daily.   LISINOPRIL (PRINIVIL,ZESTRIL) 20 MG TABLET    Take one tablet by mouth once daily for blood pressure   LORAZEPAM (ATIVAN) 0.5 MG TABLET    Take one tablet every 6 hours as needed for anxiety   METOPROLOL TARTRATE (LOPRESSOR) 25 MG TABLET    Take 1 tablet (25 mg total) by mouth 2 (two) times daily.   OMEPRAZOLE (PRILOSEC) 20 MG CAPSULE    Take one capsule by mouth once daily for stomach   OXYCODONE (OXYCONTIN) 10 MG T12A 12 HR TABLET    Take one tablet by mouth every 12 hours for pain   SIMVASTATIN (ZOCOR) 10 MG TABLET    Take one tablet by mouth at bedtime for cholesterol   ZOLPIDEM (AMBIEN) 10 MG TABLET    Take 1 tablet (10 mg total) by mouth at bedtime as needed. For sleep  Modified Medications  No medications on file  Discontinued Medications   No medications on file    Physical Exam: Filed Vitals:   03/01/15 0739  BP: 126/82  Pulse: 86  Temp: 97.6 F (36.4 C)  TempSrc: Oral  Resp: 18  Height: 5\' 5"  (1.651 m)  Weight: 155 lb 6.4 oz (70.489 kg)  SpO2: 98%   Physical Exam  Constitutional: She is oriented to person, place, and time. She appears well-developed and well-nourished.  Cardiovascular: Normal rate, regular rhythm, normal heart sounds and intact distal pulses.   Pulmonary/Chest: Effort normal and breath sounds normal. No respiratory distress.  Abdominal:  Soft. Bowel sounds are normal. She exhibits no distension and no mass. There is tenderness. There is no rebound and no guarding.  Musculoskeletal: Normal range of motion.  Neurological: She is alert and oriented to person, place, and time.  Skin: Skin is warm and dry.  Psychiatric:  Anxious     Labs reviewed: Basic Metabolic Panel:  Recent Labs  03/02/14 0823  NA 141  K 4.4  CL 103  CO2 25  GLUCOSE 88  BUN 14  CREATININE 0.68  CALCIUM 9.0   Liver Function Tests:  Recent Labs  03/02/14 0823  AST 16  ALT 68*  ALKPHOS 124*  BILITOT 0.2  PROT 6.3   No results for input(s): LIPASE, AMYLASE in the last 8760 hours. No results for input(s): AMMONIA in the last 8760 hours. CBC:  Recent Labs  03/02/14 0823  WBC 6.3  NEUTROABS 2.7  HGB 12.0  HCT 37.0  MCV 96  PLT 349   Lipid Panel:  Recent Labs  03/02/14 0823  CHOL 155  HDL 44  LDLCALC 88  TRIG 114  CHOLHDL 3.5   Lab Results  Component Value Date   HGBA1C 5.8* 03/02/2014    Procedures since last visit: Mammogram 02/01/15, had f/u ultrasound also  Assessment/Plan 1. Chronic RUQ pain -had one of her episodes of n/v that lasted one week and kept her out of work due to weakness -then recovered and back to her baseline -says she wants the comments from the ED about her seeking drugs removed from her chart which I cannot change  2. Osteopenia -cont to try to cut back on cigarettes -cont daily vitamin D -walking, weightbearing exercise  3. Tobacco use disorder -says she is down to 8-10 cigarettes per day, but not ready to quit -is stressed about her mother having Parkinson's disease up in [place] and her father who is also frail  4. Essential hypertension -bp at goal 126/82 with revised timing of bp meds last visit  5. Hyperglycemia -check hba1c today to see how that's doing -cont to watch sweets, starchy foods and walk for exercise  6.  Lipid screening -FLP today to assess, encouraged healthy  diet  Labs/tests ordered: Orders Placed This Encounter  Procedures  . CBC with Differential/Platelet  . Basic metabolic panel    Order Specific Question:  Has the patient fasted?    Answer:  Yes  . Hemoglobin A1c  . Lipid panel    Order Specific Question:  Has the patient fasted?    Answer:  Yes  . Hepatic Function Panel  . Hemoglobin A1c    Standing Status: Future     Number of Occurrences:      Standing Expiration Date: 02/29/2016  . Lipid panel    Standing Status: Future     Number of Occurrences:      Standing Expiration Date: 02/29/2016  Order Specific Question:  Has the patient fasted?    Answer:  Yes  . Hepatic Function Panel    Standing Status: Future     Number of Occurrences:      Standing Expiration Date: 02/29/2016  . Basic metabolic panel    Standing Status: Future     Number of Occurrences:      Standing Expiration Date: 02/29/2016    Order Specific Question:  Has the patient fasted?    Answer:  Yes    Next appt:  6 mos for annual exam  Sheldon Amara L. Shandel Busic, D.O. [facility] [address],  [address] Cell Phone (Mon-Fri 8am-5pm):  [phone] On Call:  [phone] & follow prompts after 5pm & weekends Office Phone:  [phone] Office Fax:  [phone]

## 2015-03-02 ENCOUNTER — Telehealth: Payer: Self-pay

## 2015-03-02 LAB — BASIC METABOLIC PANEL
BUN/Creatinine Ratio: 16 (ref 11–26)
BUN: 12 mg/dL (ref 8–27)
CO2: 22 mmol/L (ref 18–29)
Calcium: 9.5 mg/dL (ref 8.7–10.3)
Chloride: 98 mmol/L (ref 97–108)
Creatinine, Ser: 0.75 mg/dL (ref 0.57–1.00)
GFR calc Af Amer: 99 mL/min/{1.73_m2} (ref >59)
GFR calc non Af Amer: 86 mL/min/{1.73_m2} (ref >59)
Glucose: 88 mg/dL (ref 65–99)
Potassium: 4.1 mmol/L (ref 3.5–5.2)
Sodium: 138 mmol/L (ref 134–144)

## 2015-03-02 LAB — HEPATIC FUNCTION PANEL
ALT: 12 IU/L (ref 0–32)
AST: 6 IU/L (ref 0–40)
Albumin: 4.3 g/dL (ref 3.6–4.8)
Alkaline Phosphatase: 124 IU/L — ABNORMAL HIGH (ref 39–117)
Bilirubin Total: 0.2 mg/dL (ref 0.0–1.2)
Bilirubin, Direct: 0.08 mg/dL (ref 0.00–0.40)
Total Protein: 6.6 g/dL (ref 6.0–8.5)

## 2015-03-02 LAB — CBC WITH DIFFERENTIAL/PLATELET
Basophils Absolute: 0 10*3/uL (ref 0.0–0.2)
Basos: 0 %
EOS (ABSOLUTE): 0.1 10*3/uL (ref 0.0–0.4)
Eos: 2 %
Hematocrit: 39.3 % (ref 34.0–46.6)
Hemoglobin: 12.6 g/dL (ref 11.1–15.9)
Immature Grans (Abs): 0 10*3/uL (ref 0.0–0.1)
Immature Granulocytes: 0 %
Lymphocytes Absolute: 3 10*3/uL (ref 0.7–3.1)
Lymphs: 40 %
MCH: 30.3 pg (ref 26.6–33.0)
MCHC: 32.1 g/dL (ref 31.5–35.7)
MCV: 95 fL (ref 79–97)
Monocytes Absolute: 0.4 10*3/uL (ref 0.1–0.9)
Monocytes: 5 %
Neutrophils Absolute: 3.9 10*3/uL (ref 1.4–7.0)
Neutrophils: 53 %
Platelets: 370 10*3/uL (ref 150–379)
RBC: 4.16 x10E6/uL (ref 3.77–5.28)
RDW: 14.1 % (ref 12.3–15.4)
WBC: 7.4 10*3/uL (ref 3.4–10.8)

## 2015-03-02 LAB — HEMOGLOBIN A1C
Est. average glucose Bld gHb Est-mCnc: 117 mg/dL
Hgb A1c MFr Bld: 5.7 % — ABNORMAL HIGH (ref 4.8–5.6)

## 2015-03-02 LAB — LIPID PANEL
Chol/HDL Ratio: 3.7 ratio units (ref 0.0–4.4)
Cholesterol, Total: 198 mg/dL (ref 100–199)
HDL: 53 mg/dL (ref >39)
LDL Calculated: 122 mg/dL — ABNORMAL HIGH (ref 0–99)
Triglycerides: 114 mg/dL (ref 0–149)
VLDL Cholesterol Cal: 23 mg/dL (ref 5–40)

## 2015-03-02 MED ORDER — SIMVASTATIN 20 MG PO TABS: 20.0000 mg | ORAL_TABLET | Freq: Every day | ORAL | Status: DC

## 2015-03-02 NOTE — Telephone Encounter (Signed)
Spoke with patient about lab results, fax new Rx Zocor 20 mg to [facility]

## 2015-03-08 ENCOUNTER — Other Ambulatory Visit: Payer: Self-pay

## 2015-03-08 DIAGNOSIS — I10 Essential (primary) hypertension: Secondary | ICD-10-CM

## 2015-03-08 DIAGNOSIS — R000 Tachycardia, unspecified: Secondary | ICD-10-CM

## 2015-03-08 MED ORDER — METOPROLOL TARTRATE 25 MG PO TABS: 25.0000 mg | ORAL_TABLET | Freq: Two times a day (BID) | ORAL | Status: DC

## 2015-03-09 ENCOUNTER — Other Ambulatory Visit: Payer: Self-pay | Admitting: *Deleted

## 2015-03-09 MED ORDER — OXYCODONE HCL ER 10 MG PO T12A: EXTENDED_RELEASE_TABLET | ORAL | Status: DC

## 2015-03-09 NOTE — Telephone Encounter (Signed)
Patient Requested and will pick up 

## 2015-03-21 ENCOUNTER — Other Ambulatory Visit: Payer: Self-pay

## 2015-03-21 MED ORDER — ZOLPIDEM TARTRATE 10 MG PO TABS: 10.0000 mg | ORAL_TABLET | Freq: Every evening | ORAL | Status: DC | PRN

## 2015-04-06 ENCOUNTER — Other Ambulatory Visit: Payer: Self-pay

## 2015-04-06 MED ORDER — OXYCODONE HCL ER 10 MG PO T12A: EXTENDED_RELEASE_TABLET | ORAL | Status: DC

## 2015-04-06 NOTE — Telephone Encounter (Signed)
Last refill 03/09/15

## 2015-04-09 ENCOUNTER — Telehealth: Payer: Self-pay | Admitting: *Deleted

## 2015-04-09 ENCOUNTER — Other Ambulatory Visit: Payer: Self-pay | Admitting: *Deleted

## 2015-04-09 DIAGNOSIS — R1011 Right upper quadrant pain: Principal | ICD-10-CM

## 2015-04-09 DIAGNOSIS — G8929 Other chronic pain: Secondary | ICD-10-CM

## 2015-04-09 MED ORDER — OMEPRAZOLE 20 MG PO CPDR: DELAYED_RELEASE_CAPSULE | ORAL | Status: DC

## 2015-04-09 MED ORDER — AMLODIPINE BESYLATE 10 MG PO TABS: 10.0000 mg | ORAL_TABLET | Freq: Every day | ORAL | Status: DC

## 2015-04-09 NOTE — Telephone Encounter (Signed)
[facility]

## 2015-04-19 ENCOUNTER — Other Ambulatory Visit: Payer: Self-pay

## 2015-04-19 MED ORDER — ZOLPIDEM TARTRATE 10 MG PO TABS: 10.0000 mg | ORAL_TABLET | Freq: Every evening | ORAL | Status: DC | PRN

## 2015-05-07 ENCOUNTER — Other Ambulatory Visit: Payer: Self-pay | Admitting: *Deleted

## 2015-05-07 ENCOUNTER — Other Ambulatory Visit: Payer: Self-pay | Admitting: Internal Medicine

## 2015-05-07 MED ORDER — OXYCODONE HCL ER 10 MG PO T12A: EXTENDED_RELEASE_TABLET | ORAL | Status: DC

## 2015-05-07 NOTE — Telephone Encounter (Signed)
Patient requested and will pick up 

## 2015-05-09 NOTE — Addendum Note (Signed)
Addended by: Logan Bores on: 05/09/2015 10:42 AM   Modules accepted: Orders

## 2015-05-17 ENCOUNTER — Other Ambulatory Visit: Payer: Self-pay | Admitting: *Deleted

## 2015-05-17 DIAGNOSIS — F411 Generalized anxiety disorder: Secondary | ICD-10-CM

## 2015-05-17 MED ORDER — LORAZEPAM 0.5 MG PO TABS: ORAL_TABLET | ORAL | Status: DC

## 2015-05-17 NOTE — Telephone Encounter (Signed)
[facility] 

## 2015-05-24 ENCOUNTER — Other Ambulatory Visit: Payer: Self-pay | Admitting: *Deleted

## 2015-05-24 MED ORDER — ZOLPIDEM TARTRATE 10 MG PO TABS: 10.0000 mg | ORAL_TABLET | Freq: Every evening | ORAL | Status: DC | PRN

## 2015-05-24 NOTE — Telephone Encounter (Signed)
[facility] 

## 2015-06-06 ENCOUNTER — Other Ambulatory Visit: Payer: Self-pay

## 2015-06-06 MED ORDER — OXYCODONE HCL ER 10 MG PO T12A: EXTENDED_RELEASE_TABLET | ORAL | Status: DC

## 2015-06-19 ENCOUNTER — Other Ambulatory Visit: Payer: Self-pay | Admitting: *Deleted

## 2015-06-19 MED ORDER — ZOLPIDEM TARTRATE 10 MG PO TABS: 10.0000 mg | ORAL_TABLET | Freq: Every evening | ORAL | Status: DC | PRN

## 2015-06-19 NOTE — Telephone Encounter (Signed)
[facility] 

## 2015-06-26 ENCOUNTER — Other Ambulatory Visit: Payer: Self-pay | Admitting: *Deleted

## 2015-06-26 DIAGNOSIS — F411 Generalized anxiety disorder: Secondary | ICD-10-CM

## 2015-06-26 MED ORDER — LORAZEPAM 0.5 MG PO TABS: ORAL_TABLET | ORAL | Status: DC

## 2015-06-26 NOTE — Telephone Encounter (Signed)
[facility] 

## 2015-07-06 ENCOUNTER — Other Ambulatory Visit: Payer: Self-pay | Admitting: *Deleted

## 2015-07-06 MED ORDER — OXYCODONE HCL ER 10 MG PO T12A: EXTENDED_RELEASE_TABLET | ORAL | Status: DC

## 2015-07-06 NOTE — Telephone Encounter (Signed)
Patient requested and will pick up. Narcotic contract printed. 

## 2015-07-18 ENCOUNTER — Other Ambulatory Visit: Payer: Self-pay

## 2015-07-18 MED ORDER — ZOLPIDEM TARTRATE 10 MG PO TABS: 10.0000 mg | ORAL_TABLET | Freq: Every evening | ORAL | Status: DC | PRN

## 2015-08-03 ENCOUNTER — Other Ambulatory Visit: Payer: Self-pay | Admitting: *Deleted

## 2015-08-03 MED ORDER — OXYCODONE HCL ER 10 MG PO T12A: EXTENDED_RELEASE_TABLET | ORAL | Status: DC

## 2015-08-03 NOTE — Telephone Encounter (Signed)
Patient requested and will pick up 

## 2015-08-07 ENCOUNTER — Other Ambulatory Visit: Payer: Self-pay | Admitting: *Deleted

## 2015-08-07 DIAGNOSIS — F411 Generalized anxiety disorder: Secondary | ICD-10-CM

## 2015-08-07 MED ORDER — LORAZEPAM 0.5 MG PO TABS: ORAL_TABLET | ORAL | Status: DC

## 2015-08-07 MED ORDER — AMLODIPINE BESYLATE 10 MG PO TABS: 10.0000 mg | ORAL_TABLET | Freq: Every day | ORAL | Status: DC

## 2015-08-07 NOTE — Telephone Encounter (Signed)
[facility] 

## 2015-08-16 ENCOUNTER — Other Ambulatory Visit: Payer: Self-pay

## 2015-08-16 MED ORDER — ZOLPIDEM TARTRATE 10 MG PO TABS: 10.0000 mg | ORAL_TABLET | Freq: Every evening | ORAL | Status: DC | PRN

## 2015-09-05 ENCOUNTER — Other Ambulatory Visit: Payer: Self-pay | Admitting: *Deleted

## 2015-09-05 MED ORDER — OXYCODONE HCL ER 10 MG PO T12A: EXTENDED_RELEASE_TABLET | ORAL | Status: DC

## 2015-09-05 NOTE — Telephone Encounter (Signed)
Patient requested and will pick up 

## 2015-09-12 ENCOUNTER — Other Ambulatory Visit: Payer: Self-pay | Admitting: *Deleted

## 2015-09-12 DIAGNOSIS — F411 Generalized anxiety disorder: Secondary | ICD-10-CM

## 2015-09-12 MED ORDER — ZOLPIDEM TARTRATE 10 MG PO TABS: 10.0000 mg | ORAL_TABLET | Freq: Every evening | ORAL | Status: DC | PRN

## 2015-09-12 MED ORDER — LORAZEPAM 0.5 MG PO TABS: ORAL_TABLET | ORAL | Status: DC

## 2015-09-12 NOTE — Telephone Encounter (Signed)
[facility] 

## 2015-09-12 NOTE — Telephone Encounter (Signed)
[facility]

## 2015-09-26 ENCOUNTER — Other Ambulatory Visit: Payer: PRIVATE HEALTH INSURANCE

## 2015-09-28 ENCOUNTER — Telehealth: Payer: Self-pay | Admitting: *Deleted

## 2015-09-28 ENCOUNTER — Emergency Department (HOSPITAL_BASED_OUTPATIENT_CLINIC_OR_DEPARTMENT_OTHER)
Admission: EM | Admit: 2015-09-28 | Discharge: 2015-09-28 | Disposition: A | Discharge status: Home / Self Care | Payer: PRIVATE HEALTH INSURANCE | Attending: Emergency Medicine | Admitting: Emergency Medicine

## 2015-09-28 ENCOUNTER — Encounter (HOSPITAL_BASED_OUTPATIENT_CLINIC_OR_DEPARTMENT_OTHER): Payer: Self-pay

## 2015-09-28 ENCOUNTER — Encounter: Payer: Self-pay | Admitting: Internal Medicine

## 2015-09-28 ENCOUNTER — Other Ambulatory Visit: Payer: Self-pay | Admitting: Internal Medicine

## 2015-09-28 ENCOUNTER — Ambulatory Visit (INDEPENDENT_AMBULATORY_CARE_PROVIDER_SITE_OTHER): Payer: PRIVATE HEALTH INSURANCE | Admitting: Internal Medicine

## 2015-09-28 VITALS — BP 112/80 | HR 87 | Temp 98.3°F | Resp 20 | Ht 65.0 in | Wt 155.0 lb

## 2015-09-28 DIAGNOSIS — R1010 Upper abdominal pain, unspecified: Secondary | ICD-10-CM | POA: Diagnosis not present

## 2015-09-28 DIAGNOSIS — Z8719 Personal history of other diseases of the digestive system: Secondary | ICD-10-CM | POA: Diagnosis not present

## 2015-09-28 DIAGNOSIS — Z13220 Encounter for screening for lipoid disorders: Secondary | ICD-10-CM

## 2015-09-28 DIAGNOSIS — I10 Essential (primary) hypertension: Secondary | ICD-10-CM | POA: Diagnosis not present

## 2015-09-28 DIAGNOSIS — Z114 Encounter for screening for human immunodeficiency virus [HIV]: Secondary | ICD-10-CM | POA: Diagnosis not present

## 2015-09-28 DIAGNOSIS — Z90710 Acquired absence of both cervix and uterus: Secondary | ICD-10-CM | POA: Insufficient documentation

## 2015-09-28 DIAGNOSIS — Z0000 Encounter for general adult medical examination without abnormal findings: Secondary | ICD-10-CM

## 2015-09-28 DIAGNOSIS — R739 Hyperglycemia, unspecified: Secondary | ICD-10-CM | POA: Diagnosis not present

## 2015-09-28 DIAGNOSIS — G8929 Other chronic pain: Secondary | ICD-10-CM

## 2015-09-28 DIAGNOSIS — Z23 Encounter for immunization: Secondary | ICD-10-CM

## 2015-09-28 DIAGNOSIS — M8580 Other specified disorders of bone density and structure, unspecified site: Secondary | ICD-10-CM | POA: Diagnosis not present

## 2015-09-28 DIAGNOSIS — Z9049 Acquired absence of other specified parts of digestive tract: Secondary | ICD-10-CM | POA: Diagnosis not present

## 2015-09-28 DIAGNOSIS — F17200 Nicotine dependence, unspecified, uncomplicated: Secondary | ICD-10-CM | POA: Diagnosis not present

## 2015-09-28 DIAGNOSIS — R1011 Right upper quadrant pain: Secondary | ICD-10-CM

## 2015-09-28 DIAGNOSIS — Z79899 Other long term (current) drug therapy: Secondary | ICD-10-CM | POA: Insufficient documentation

## 2015-09-28 DIAGNOSIS — F17210 Nicotine dependence, cigarettes, uncomplicated: Secondary | ICD-10-CM | POA: Diagnosis not present

## 2015-09-28 DIAGNOSIS — B373 Candidiasis of vulva and vagina: Secondary | ICD-10-CM

## 2015-09-28 DIAGNOSIS — B3731 Acute candidiasis of vulva and vagina: Secondary | ICD-10-CM

## 2015-09-28 DIAGNOSIS — R1110 Vomiting, unspecified: Secondary | ICD-10-CM | POA: Insufficient documentation

## 2015-09-28 LAB — COMPREHENSIVE METABOLIC PANEL
ALBUMIN: 4.5 g/dL (ref 3.5–5.0)
ALK PHOS: 142 U/L — AB (ref 38–126)
ALT: 16 U/L (ref 14–54)
ANION GAP: 15 (ref 5–15)
AST: 22 U/L (ref 15–41)
BILIRUBIN TOTAL: 1.1 mg/dL (ref 0.3–1.2)
BUN: 19 mg/dL (ref 6–20)
CO2: 24 mmol/L (ref 22–32)
Calcium: 9.4 mg/dL (ref 8.9–10.3)
Chloride: 101 mmol/L (ref 101–111)
Creatinine, Ser: 0.76 mg/dL (ref 0.44–1.00)
GFR calc Af Amer: >60 mL/min (ref >60)
GFR calc non Af Amer: >60 mL/min (ref >60)
GLUCOSE: 162 mg/dL — AB (ref 65–99)
POTASSIUM: 3 mmol/L — AB (ref 3.5–5.1)
SODIUM: 140 mmol/L (ref 135–145)
Total Protein: 7.5 g/dL (ref 6.5–8.1)

## 2015-09-28 LAB — CBC WITH DIFFERENTIAL/PLATELET
BASOS ABS: 0 10*3/uL (ref 0.0–0.1)
BASOS PCT: 0 %
EOS ABS: 0 10*3/uL (ref 0.0–0.7)
Eosinophils Relative: 0 %
HEMATOCRIT: 40.5 % (ref 36.0–46.0)
HEMOGLOBIN: 13.3 g/dL (ref 12.0–15.0)
Lymphocytes Relative: 10 %
Lymphs Abs: 1.4 10*3/uL (ref 0.7–4.0)
MCH: 30.3 pg (ref 26.0–34.0)
MCHC: 32.8 g/dL (ref 30.0–36.0)
MCV: 92.3 fL (ref 78.0–100.0)
Monocytes Absolute: 0.3 10*3/uL (ref 0.1–1.0)
Monocytes Relative: 2 %
NEUTROS ABS: 11.9 10*3/uL — AB (ref 1.7–7.7)
NEUTROS PCT: 88 %
Platelets: 383 10*3/uL (ref 150–400)
RBC: 4.39 MIL/uL (ref 3.87–5.11)
RDW: 14.7 % (ref 11.5–15.5)
WBC: 13.6 10*3/uL — ABNORMAL HIGH (ref 4.0–10.5)

## 2015-09-28 MED ORDER — FLUCONAZOLE 150 MG PO TABS: ORAL_TABLET | ORAL | Status: DC

## 2015-09-28 MED ORDER — ONDANSETRON HCL 4 MG/2ML IJ SOLN
4.0000 mg | Freq: Once | INTRAMUSCULAR | Status: AC
  Administered 2015-09-28: 4 mg via INTRAVENOUS
  Filled 2015-09-28: qty 2

## 2015-09-28 MED ORDER — LORAZEPAM 2 MG/ML IJ SOLN
1.0000 mg | Freq: Once | INTRAMUSCULAR | Status: AC
  Administered 2015-09-28: 1 mg via INTRAVENOUS
  Filled 2015-09-28: qty 1

## 2015-09-28 MED ORDER — ONDANSETRON HCL 4 MG/2ML IJ SOLN
4.0000 mg | Freq: Once | INTRAMUSCULAR | Status: AC
  Administered 2015-09-28: 4 mg via INTRAVENOUS

## 2015-09-28 MED ORDER — ONDANSETRON HCL 4 MG/2ML IJ SOLN
4.0000 mg | Freq: Once | INTRAMUSCULAR | Status: DC
  Filled 2015-09-28: qty 2

## 2015-09-28 MED ORDER — POTASSIUM CHLORIDE CRYS ER 20 MEQ PO TBCR
40.0000 meq | EXTENDED_RELEASE_TABLET | Freq: Once | ORAL | Status: AC
  Administered 2015-09-28: 40 meq via ORAL
  Filled 2015-09-28: qty 2

## 2015-09-28 MED ORDER — SODIUM CHLORIDE 0.9 % IV SOLN
Freq: Once | INTRAVENOUS | Status: AC
  Administered 2015-09-28: 20:00:00 via INTRAVENOUS

## 2015-09-28 NOTE — ED Notes (Signed)
After IV was placed, pt stated she wanted to go to the restroom to throw up-that she didn't want to throw up in the room. She also stated that her friend brought her here and that they are out in the car.

## 2015-09-28 NOTE — Telephone Encounter (Signed)
Patient notified

## 2015-09-28 NOTE — ED Notes (Signed)
It was noted that the pt had thrown up on herself, after insisting that she go to the restroom earlier to vomit.

## 2015-09-28 NOTE — Discharge Instructions (Signed)

## 2015-09-28 NOTE — Telephone Encounter (Signed)
Patient would like a refill on Diflucan. Stated that she forgot to ask about it before she left.

## 2015-09-28 NOTE — Progress Notes (Signed)
Patient ID: Theresa Carey, female   DOB: [birth year], 60 y.o.   MRN: [id number]   Location: [place]  Provider: Rexene Edison. Mariea Clonts, D.O., C.M.D.  Code Status: full code Goals of Care:  Advanced Directives 03/01/2015  Does patient have an advance directive? No  Would patient like information on creating an advanced directive? Yes - Geneticist, molecular Complaint  Patient presents with  . Annual Exam    EKG,MMSE done  . Medical Management of Chronic Issues    HPI: Patient is a 60 y.o. female seen in the office today for an annual wellness exam and medical mgt of chronic diseases.    No new complaints.  Has her usual nausea and vomiting.  Reports a lot of vomiting in the past month.  Reports one time, she opened the refrigerator and became nauseated.    Depression screen [facility] 2/9 09/28/2015 08/31/2014  Decreased Interest 0 0  Down, Depressed, Hopeless 0 0  PHQ - 2 Score 0 0    Fall Risk  09/28/2015 03/01/2015 08/31/2014  Falls in the past year? No No No   MMSE - Mini Mental State Exam 09/28/2015  Not completed: (No Data)  Orientation to time 5  Orientation to Place 5  Registration 3  Attention/ Calculation 4  Recall 3  Language- name 2 objects 2  Language- repeat 1  Language- follow 3 step command 3  Language- read & follow direction 1  Write a sentence 1  Copy design 1  Total score 29  passed clock  Health Maintenance  Topic Date Due  . HIV Screening  09/19/1968  . TETANUS/TDAP  09/19/1972  . PAP SMEAR  09/19/1974  . ZOSTAVAX  09/19/2013  . INFLUENZA VACCINE  03/26/2015  . COLONOSCOPY  08/25/2016  . MAMMOGRAM  01/16/2017  . Hepatitis C Screening  Completed   Urinary incontinence?  No problems here.   Functional status?  Independent in daily functioning.  Still works as Education officer, museum. Exercise?  A little walking here and there--should do more.  3 days per week Diet?  Does not follow special diet--eats as healthy as possible.  Does eat fast food.   Does not fry food at home. Vision:  Vision ok.  A little difficulty reading--uses otc readers. Hearing:  No difficulty hearing.   Dentition:  No difficulty chewing, swallowing, sees dentist regularly. Pain:  Abdominal pain.  Off and on, associated with nausea and vomiting.  1990ish this all began.  Ever since she had her gallbladder removed.  Uses oxycodone longstanding--is dependent on her medication at this time.    Still smoking also.  About the same amount as last time.    Discussed trying to go to the health department for zoster rx.    Osteopenia:  Continues on vitamin D. Still smoking.  Does do some walking for exercise.  HTN:  Well controlled today with amlodipine and lisinopril.    Review of Systems:  Review of Systems  Constitutional: Negative for fever, chills, weight loss and malaise/fatigue.  HENT: Negative for congestion and hearing loss.   Eyes: Positive for blurred vision.       With reading--uses readers  Respiratory: Negative for shortness of breath.   Cardiovascular: Negative for chest pain and leg swelling.  Gastrointestinal: Positive for nausea, vomiting and abdominal pain. Negative for heartburn, diarrhea, constipation, blood in stool and melena.  Genitourinary: Negative for dysuria, urgency and frequency.  Musculoskeletal: Negative for falls.  Skin: Positive for itching and  rash.       follicultiis left buttock  Neurological: Negative for dizziness, sensory change, loss of consciousness and weakness.  Psychiatric/Behavioral: Negative for depression and memory loss.       Is highly stressed due to her mother's dementia, recent loss of an aunt    Past Medical History  Diagnosis Date  . Hypertension   . Abdominal pain 2000    chronic abdominal pain with nausea and vomiting  . Allergy   . Abnormal liver function     Past Surgical History  Procedure Laterality Date  . Abdominal hysterectomy    . Cholecystectomy      The patient has a family history  of  shoulder  Allergies  Allergen Reactions  . Avelox [Moxifloxacin Hcl In Nacl]     Low blood pressure  . Compazine [Prochlorperazine Edisylate] Other (See Comments)    Caused lockjaw  . Ibuprofen Hives  . Morphine And Related Nausea And Vomiting  . Promethazine Hcl Nausea And Vomiting      Medication List       This list is accurate as of: 09/28/15  9:12 AM.  Always use your most recent med list.               amLODipine 10 MG tablet  Commonly known as:  NORVASC  Take 1 tablet (10 mg total) by mouth daily.     lisinopril 20 MG tablet  Commonly known as:  PRINIVIL,ZESTRIL  take 1 tablet by mouth once daily for blood pressure     LORazepam 0.5 MG tablet  Commonly known as:  ATIVAN  Take one tablet every 6 hours as needed for anxiety     metoprolol tartrate 25 MG tablet  Commonly known as:  LOPRESSOR  Take 1 tablet (25 mg total) by mouth 2 (two) times daily.     omeprazole 20 MG capsule  Commonly known as:  PRILOSEC  Take one capsule by mouth once daily for stomach     oxyCODONE 10 mg 12 hr tablet  Commonly known as:  OXYCONTIN  Take one tablet by mouth every 12 hours for pain     simvastatin 20 MG tablet  Commonly known as:  ZOCOR  Take 1 tablet (20 mg total) by mouth at bedtime.     Vitamin D 2000 units Caps  Take 1 capsule (2,000 Units total) by mouth daily.     zolpidem 10 MG tablet  Commonly known as:  AMBIEN  Take 1 tablet (10 mg total) by mouth at bedtime as needed. For sleep        Physical Exam: Filed Vitals:   09/28/15 0845  BP: 112/80  Pulse: 87  Temp: 98.3 F (36.8 C)  TempSrc: Oral  Resp: 20  Height: 5\' 5"  (1.651 m)  Weight: 155 lb (70.308 kg)  SpO2: 96%   Body mass index is 25.79 kg/(m^2). Physical Exam  Constitutional: She is oriented to person, place, and time. She appears well-developed and well-nourished. No distress.  HENT:  Head: Normocephalic and atraumatic.  Right Ear: External ear normal.  Left Ear: External ear  normal.  Nose: Nose normal.  Mouth/Throat: Oropharynx is clear and moist. No oropharyngeal exudate.  Eyes: Conjunctivae and EOM are normal. Pupils are equal, round, and reactive to light.  Neck: Normal range of motion. Neck supple. No JVD present.  Cardiovascular: Normal rate, regular rhythm, normal heart sounds and intact distal pulses.   Pulmonary/Chest: Effort normal and breath sounds normal. No respiratory distress. Right  breast exhibits no inverted nipple, no mass, no nipple discharge, no skin change and no tenderness. Left breast exhibits no inverted nipple, no mass, no nipple discharge, no skin change and no tenderness.  Abdominal: Soft. Bowel sounds are normal. She exhibits no distension and no mass. There is no tenderness. There is no rebound and no guarding.  Not tender at this time  Musculoskeletal: Normal range of motion. She exhibits no edema or tenderness.  Lymphadenopathy:    She has no cervical adenopathy.  Neurological: She is alert and oriented to person, place, and time. She has normal reflexes. She displays normal reflexes. No cranial nerve deficit. She exhibits normal muscle tone. Coordination normal.  Skin: Skin is warm and dry.  Psychiatric: She has a normal mood and affect. Her behavior is normal. Judgment and thought content normal.    Labs reviewed: Basic Metabolic Panel:  Recent Labs  03/01/15 0815  NA 138  K 4.1  CL 98  CO2 22  GLUCOSE 88  BUN 12  CREATININE 0.75  CALCIUM 9.5   Liver Function Tests:  Recent Labs  03/01/15 0815  AST 6  ALT 12  ALKPHOS 124*  BILITOT 0.2  PROT 6.6  ALBUMIN 4.3   No results for input(s): LIPASE, AMYLASE in the last 8760 hours. No results for input(s): AMMONIA in the last 8760 hours. CBC:  Recent Labs  03/01/15 0815  WBC 7.4  NEUTROABS 3.9  HCT 39.3  MCV 95  PLT 370   Lipid Panel:  Recent Labs  03/01/15 0815  CHOL 198  HDL 53  LDLCALC 122*  TRIG 114  CHOLHDL 3.7   Lab Results  Component Value  Date   HGBA1C 5.7* 03/01/2015    Assessment/Plan 1. Annual physical exam -see hpi  2. Chronic RUQ pain - continues on her chronic oxycodone which she is dependent on at this point -f/u labs - CBC with Differential/Platelet - Hepatic Function Panel  3. Osteopenia -cont vitamin D, encouraged smoking cessation, cont weightbearing exercise  4. Tobacco use disorder -cont to work on cutting back on smoking, but she feels her stress level is too high right now  5. Hyperglycemia - cont to work on diet and exercise - Hemoglobin A1c  6. Essential hypertension -bp at goal with current meds, no changes, cont same - Basic metabolic panel  7. Lipid screening - f/u labs -cont zocor - Lipid panel  8. Encounter for screening for HIV - has not had this screening so ordered today with other labs due to age, risks working in health care field - HIV aintibody (with reflex)  9. Need for diphtheria-tetanus-pertussis (Tdap) vaccine, adult/adolescent -given tdap today  10. Need for prophylactic vaccination and inoculation against influenza -flu shot given  Labs/tests ordered:   Orders Placed This Encounter  Procedures  . HIV antibody (with reflex)  . CBC with Differential/Platelet  . Hemoglobin A1c  . Lipid panel    Order Specific Question:  Has the patient fasted?    Answer:  Yes  . Basic metabolic panel    Order Specific Question:  Has the patient fasted?    Answer:  Yes  . Hepatic Function Panel   Next appt:  6 mos med mgt  Ndeye Tenorio L. Blonnie Maske, D.O. [facility] [address], [place] [address] Cell Phone (Mon-Fri 8am-5pm):  [phone] On Call:  [phone] & follow prompts after 5pm & weekends Office Phone:  [phone] Office Fax:  [phone]

## 2015-09-28 NOTE — ED Notes (Signed)
Pt given d/c instructions as per chart. Verbalizes understanding. No questions. 

## 2015-09-28 NOTE — Telephone Encounter (Signed)
Sent to [facility].

## 2015-09-28 NOTE — Addendum Note (Signed)
Addended by: Eilene Ghazi on: 09/28/2015 10:25 AM   Modules accepted: Orders

## 2015-09-28 NOTE — ED Provider Notes (Signed)
CSN: [id number]     Arrival date & time 09/28/15  1833 History   First MD Initiated Contact with Patient 09/28/15 1852     Chief Complaint  Patient presents with  . Emesis     (Consider location/radiation/quality/duration/timing/severity/associated sxs/prior Treatment) Patient is a 60 y.o. female presenting with vomiting. The history is provided by the patient. No language interpreter was used.  Emesis Severity:  Moderate Timing:  Constant Number of daily episodes:  Multiple Progression:  Unchanged Chronicity:  New Recent urination:  Normal Relieved by:  Nothing Worsened by:  Nothing tried Ineffective treatments:  None tried Associated symptoms: no abdominal pain   Risk factors: no sick contacts and no suspect food intake   Pt reports she has had multiple episodes of vomitting over several years.  Pt reports she has had multiple evaluation.  Pt reports she normally receives IV ativan for symptoms.  (Pt reports she has discussed with her physician concerns that she does not get the medications she needs in the ED.   Past Medical History  Diagnosis Date  . Hypertension   . Abdominal pain 2000    chronic abdominal pain with nausea and vomiting  . Allergy   . Abnormal liver function    Past Surgical History  Procedure Laterality Date  . Abdominal hysterectomy    . Cholecystectomy     Family History  Problem Relation Age of Onset  . Hypertension Father   . Cancer - Other Father     Bladder  . Hypertension Mother   . Diabetes Maternal Grandmother   . Cancer - Other Maternal Grandmother     Pancreatic  . Cancer - Other Paternal Grandmother     Bone  . Cancer Father   . Cancer Father   . Cancer Father    Social History  Substance Use Topics  . Smoking status: Current Every Day Smoker -- 0.50 packs/day    Types: Cigarettes  . Smokeless tobacco: Never Used  . Alcohol Use: Yes     Comment: socially   OB History    No data available     Review of Systems   Gastrointestinal: Positive for vomiting. Negative for abdominal pain.  All other systems reviewed and are negative.     Allergies  Avelox; Compazine; Ibuprofen; Morphine and related; and Promethazine hcl  Home Medications   Prior to Admission medications   Medication Sig Start Date End Date Taking? Authorizing Provider  amLODipine (NORVASC) 10 MG tablet Take 1 tablet (10 mg total) by mouth daily. 08/07/15   Tiffany L Reed, DO  Cholecalciferol (VITAMIN D) 2000 UNITS CAPS Take 1 capsule (2,000 Units total) by mouth daily. 08/31/14   Tiffany L Reed, DO  fluconazole (DIFLUCAN) 150 MG tablet Take one tablet today and repeat with one tablet in 2 days 09/28/15   Gayland Curry, DO  lisinopril (PRINIVIL,ZESTRIL) 20 MG tablet take 1 tablet by mouth once daily for blood pressure 05/07/15   Tiffany L Reed, DO  LORazepam (ATIVAN) 0.5 MG tablet Take one tablet every 6 hours as needed for anxiety 09/12/15   Gildardo Cranker, DO  metoprolol tartrate (LOPRESSOR) 25 MG tablet Take 1 tablet (25 mg total) by mouth 2 (two) times daily. 03/08/15   Tiffany L Reed, DO  omeprazole (PRILOSEC) 20 MG capsule Take one capsule by mouth once daily for stomach 04/09/15   Tiffany L Reed, DO  oxyCODONE (OXYCODONE) 10 mg 12 hr tablet Take one tablet by mouth every 12 hours for pain  09/05/15   Gildardo Cranker, DO  simvastatin (ZOCOR) 20 MG tablet Take 1 tablet (20 mg total) by mouth at bedtime. 03/02/15   Tiffany L Reed, DO  zolpidem (AMBIEN) 10 MG tablet Take 1 tablet (10 mg total) by mouth at bedtime as needed. For sleep 09/12/15   Gildardo Cranker, DO   BP 152/91 mmHg  Pulse 112  Temp(Src) 99.7 F (37.6 C) (Oral)  Resp 18  Ht 5\' 7"  (1.702 m)  Wt 70.308 kg  BMI 24.27 kg/m2  SpO2 100% Physical Exam  Constitutional: She is oriented to person, place, and time. She appears well-developed and well-nourished.  HENT:  Head: Normocephalic.  Eyes: EOM are normal.  Neck: Normal range of motion.  Pulmonary/Chest: Effort normal.   Abdominal: She exhibits no distension.  Musculoskeletal: Normal range of motion.  Neurological: She is alert and oriented to person, place, and time.  Psychiatric: She has a normal mood and affect.  Nursing note and vitals reviewed.   ED Course  Procedures (including critical care time) Labs Review Labs Reviewed  CBC WITH DIFFERENTIAL/PLATELET - Abnormal; Notable for the following:    WBC 13.6 (*)    Neutro Abs 11.9 (*)    All other components within normal limits  COMPREHENSIVE METABOLIC PANEL - Abnormal; Notable for the following:    Potassium 3.0 (*)    Glucose, Bld 162 (*)    Alkaline Phosphatase 142 (*)    All other components within normal limits    Imaging Review No results found. I have personally reviewed and evaluated these images and lab results as part of my medical decision-making.   EKG Interpretation None      MDM Pt's potassium is low.   Pt given 40 meQ po.  Pt given ativan and zofran x one with relief.  Pt given second dosage and IV fluids x 1 liter.  Pt saw her Md in the office today.   I advised pt to continue medications as prescribed by her MD.     Final diagnoses:  Chronic vomiting        Fransico Meadow, PA-C 09/28/15 2045  [place], [place] 09/28/15 2048  Davonna Belling, MD 09/28/15 [phone]

## 2015-09-28 NOTE — ED Notes (Signed)
C/o vomiting, abd pain since 12pm-pt was seen by PCP today for checkup but did not have c/o at that time

## 2015-09-28 NOTE — Telephone Encounter (Signed)
Patient called and left message stating that she was just seen today and forgot to ask for a Rx.  I could not understand what Rx she was asking for so I called and LMOM for her to call back with the name and I would check into it for her. Awaiting call.

## 2015-09-28 NOTE — Patient Instructions (Signed)
Health Maintenance, Female Adopting a healthy lifestyle and getting preventive care can go a long way to promote health and wellness. Talk with your health care provider about what schedule of regular examinations is right for you. This is a good chance for you to check in with your provider about disease prevention and staying healthy. In between checkups, there are plenty of things you can do on your own. Experts have done a lot of research about which lifestyle changes and preventive measures are most likely to keep you healthy. Ask your health care provider for more information. WEIGHT AND DIET  Eat a healthy diet  Be sure to include plenty of vegetables, fruits, low-fat dairy products, and lean protein.  Do not eat a lot of foods high in solid fats, added sugars, or salt.  Get regular exercise. This is one of the most important things you can do for your health.  Most adults should exercise for at least 150 minutes each week. The exercise should increase your heart rate and make you sweat (moderate-intensity exercise).  Most adults should also do strengthening exercises at least twice a week. This is in addition to the moderate-intensity exercise.  Maintain a healthy weight  Body mass index (BMI) is a measurement that can be used to identify possible weight problems. It estimates body fat based on height and weight. Your health care provider can help determine your BMI and help you achieve or maintain a healthy weight.  For females 20 years of age and older:   A BMI below 18.5 is considered underweight.  A BMI of 18.5 to 24.9 is normal.  A BMI of 25 to 29.9 is considered overweight.  A BMI of 30 and above is considered obese.  Watch levels of cholesterol and blood lipids  You should start having your blood tested for lipids and cholesterol at 62 years of age, then have this test every 5 years.  You may need to have your cholesterol levels checked more often if:  Your lipid  or cholesterol levels are high.  You are older than 62 years of age.  You are at high risk for heart disease.  CANCER SCREENING   Lung Cancer  Lung cancer screening is recommended for adults 55-80 years old who are at high risk for lung cancer because of a history of smoking.  A yearly low-dose CT scan of the lungs is recommended for people who:  Currently smoke.  Have quit within the past 15 years.  Have at least a 30-pack-year history of smoking. A pack year is smoking an average of one pack of cigarettes a day for 1 year.  Yearly screening should continue until it has been 15 years since you quit.  Yearly screening should stop if you develop a health problem that would prevent you from having lung cancer treatment.  Breast Cancer  Practice breast self-awareness. This means understanding how your breasts normally appear and feel.  It also means doing regular breast self-exams. Let your health care provider know about any changes, no matter how small.  If you are in your 20s or 30s, you should have a clinical breast exam (CBE) by a health care provider every 1-3 years as part of a regular health exam.  If you are 40 or older, have a CBE every year. Also consider having a breast X-ray (mammogram) every year.  If you have a family history of breast cancer, talk to your health care provider about genetic screening.  If you   are at high risk for breast cancer, talk to your health care provider about having an MRI and a mammogram every year.  Breast cancer gene (BRCA) assessment is recommended for women who have family members with BRCA-related cancers. BRCA-related cancers include:  Breast.  Ovarian.  Tubal.  Peritoneal cancers.  Results of the assessment will determine the need for genetic counseling and BRCA1 and BRCA2 testing. Cervical Cancer Your health care provider may recommend that you be screened regularly for cancer of the pelvic organs (ovaries, uterus, and  vagina). This screening involves a pelvic examination, including checking for microscopic changes to the surface of your cervix (Pap test). You may be encouraged to have this screening done every 3 years, beginning at age 21.  For women ages 30-65, health care providers may recommend pelvic exams and Pap testing every 3 years, or they may recommend the Pap and pelvic exam, combined with testing for human papilloma virus (HPV), every 5 years. Some types of HPV increase your risk of cervical cancer. Testing for HPV may also be done on women of any age with unclear Pap test results.  Other health care providers may not recommend any screening for nonpregnant women who are considered low risk for pelvic cancer and who do not have symptoms. Ask your health care provider if a screening pelvic exam is right for you.  If you have had past treatment for cervical cancer or a condition that could lead to cancer, you need Pap tests and screening for cancer for at least 20 years after your treatment. If Pap tests have been discontinued, your risk factors (such as having a new sexual partner) need to be reassessed to determine if screening should resume. Some women have medical problems that increase the chance of getting cervical cancer. In these cases, your health care provider may recommend more frequent screening and Pap tests. Colorectal Cancer  This type of cancer can be detected and often prevented.  Routine colorectal cancer screening usually begins at 62 years of age and continues through 62 years of age.  Your health care provider may recommend screening at an earlier age if you have risk factors for colon cancer.  Your health care provider may also recommend using home test kits to check for hidden blood in the stool.  A small camera at the end of a tube can be used to examine your colon directly (sigmoidoscopy or colonoscopy). This is done to check for the earliest forms of colorectal  cancer.  Routine screening usually begins at age 50.  Direct examination of the colon should be repeated every 5-10 years through 62 years of age. However, you may need to be screened more often if early forms of precancerous polyps or small growths are found. Skin Cancer  Check your skin from head to toe regularly.  Tell your health care provider about any new moles or changes in moles, especially if there is a change in a mole's shape or color.  Also tell your health care provider if you have a mole that is larger than the size of a pencil eraser.  Always use sunscreen. Apply sunscreen liberally and repeatedly throughout the day.  Protect yourself by wearing long sleeves, pants, a wide-brimmed hat, and sunglasses whenever you are outside. HEART DISEASE, DIABETES, AND HIGH BLOOD PRESSURE   High blood pressure causes heart disease and increases the risk of stroke. High blood pressure is more likely to develop in:  People who have blood pressure in the high end   of the normal range (130-139/85-89 mm Hg).  People who are overweight or obese.  People who are African American.  If you are 38-23 years of age, have your blood pressure checked every 3-5 years. If you are 61 years of age or older, have your blood pressure checked every year. You should have your blood pressure measured twice--once when you are at a hospital or clinic, and once when you are not at a hospital or clinic. Record the average of the two measurements. To check your blood pressure when you are not at a hospital or clinic, you can use:  An automated blood pressure machine at a pharmacy.  A home blood pressure monitor.  If you are between 45 years and 39 years old, ask your health care provider if you should take aspirin to prevent strokes.  Have regular diabetes screenings. This involves taking a blood sample to check your fasting blood sugar level.  If you are at a normal weight and have a low risk for diabetes,  have this test once every three years after 62 years of age.  If you are overweight and have a high risk for diabetes, consider being tested at a younger age or more often. PREVENTING INFECTION  Hepatitis B  If you have a higher risk for hepatitis B, you should be screened for this virus. You are considered at high risk for hepatitis B if:  You were born in a country where hepatitis B is common. Ask your health care provider which countries are considered high risk.  Your parents were born in a high-risk country, and you have not been immunized against hepatitis B (hepatitis B vaccine).  You have HIV or AIDS.  You use needles to inject street drugs.  You live with someone who has hepatitis B.  You have had sex with someone who has hepatitis B.  You get hemodialysis treatment.  You take certain medicines for conditions, including cancer, organ transplantation, and autoimmune conditions. Hepatitis C  Blood testing is recommended for:  Everyone born from [birth year] through 1967.  Anyone with known risk factors for hepatitis C. Sexually transmitted infections (STIs)  You should be screened for sexually transmitted infections (STIs) including gonorrhea and chlamydia if:  You are sexually active and are younger than 62 years of age.  You are older than 62 years of age and your health care provider tells you that you are at risk for this type of infection.  Your sexual activity has changed since you were last screened and you are at an increased risk for chlamydia or gonorrhea. Ask your health care provider if you are at risk.  If you do not have HIV, but are at risk, it may be recommended that you take a prescription medicine daily to prevent HIV infection. This is called pre-exposure prophylaxis (PrEP). You are considered at risk if:  You are sexually active and do not regularly use condoms or know the HIV status of your partner(s).  You take drugs by injection.  You are sexually  active with a partner who has HIV. Talk with your health care provider about whether you are at high risk of being infected with HIV. If you choose to begin PrEP, you should first be tested for HIV. You should then be tested every 3 months for as long as you are taking PrEP.  PREGNANCY   If you are premenopausal and you may become pregnant, ask your health care provider about preconception counseling.  If you may  become pregnant, take 400 to 800 micrograms (mcg) of folic acid every day.  If you want to prevent pregnancy, talk to your health care provider about birth control (contraception). OSTEOPOROSIS AND MENOPAUSE   Osteoporosis is a disease in which the bones lose minerals and strength with aging. This can result in serious bone fractures. Your risk for osteoporosis can be identified using a bone density scan.  If you are 61 years of age or older, or if you are at risk for osteoporosis and fractures, ask your health care provider if you should be screened.  Ask your health care provider whether you should take a calcium or vitamin D supplement to lower your risk for osteoporosis.  Menopause may have certain physical symptoms and risks.  Hormone replacement therapy may reduce some of these symptoms and risks. Talk to your health care provider about whether hormone replacement therapy is right for you.  HOME CARE INSTRUCTIONS   Schedule regular health, dental, and eye exams.  Stay current with your immunizations.   Do not use any tobacco products including cigarettes, chewing tobacco, or electronic cigarettes.  If you are pregnant, do not drink alcohol.  If you are breastfeeding, limit how much and how often you drink alcohol.  Limit alcohol intake to no more than 1 drink per day for nonpregnant women. One drink equals 12 ounces of beer, 5 ounces of wine, or 1 ounces of hard liquor.  Do not use street drugs.  Do not share needles.  Ask your health care provider for help if  you need support or information about quitting drugs.  Tell your health care provider if you often feel depressed.  Tell your health care provider if you have ever been abused or do not feel safe at home.   This information is not intended to replace advice given to you by your health care provider. Make sure you discuss any questions you have with your health care provider.   Document Released: 02/24/2011 Document Revised: 09/01/2014 Document Reviewed: 07/13/2013 Elsevier Interactive Patient Education [employer].

## 2015-09-29 LAB — HEPATIC FUNCTION PANEL
ALT: 14 IU/L (ref 0–32)
AST: 17 IU/L (ref 0–40)
Albumin: 4.5 g/dL (ref 3.6–4.8)
Alkaline Phosphatase: 163 IU/L — ABNORMAL HIGH (ref 39–117)
Bilirubin Total: 0.7 mg/dL (ref 0.0–1.2)
Bilirubin, Direct: 0.18 mg/dL (ref 0.00–0.40)
Total Protein: 6.9 g/dL (ref 6.0–8.5)

## 2015-09-29 LAB — CBC WITH DIFFERENTIAL/PLATELET
Basophils Absolute: 0 10*3/uL (ref 0.0–0.2)
Basos: 0 %
EOS (ABSOLUTE): 0.1 10*3/uL (ref 0.0–0.4)
Eos: 1 %
Hematocrit: 41.8 % (ref 34.0–46.6)
Hemoglobin: 14 g/dL (ref 11.1–15.9)
Immature Grans (Abs): 0 10*3/uL (ref 0.0–0.1)
Immature Granulocytes: 0 %
Lymphocytes Absolute: 2.7 10*3/uL (ref 0.7–3.1)
Lymphs: 25 %
MCH: 31 pg (ref 26.6–33.0)
MCHC: 33.5 g/dL (ref 31.5–35.7)
MCV: 93 fL (ref 79–97)
Monocytes Absolute: 0.6 10*3/uL (ref 0.1–0.9)
Monocytes: 6 %
Neutrophils Absolute: 7.2 10*3/uL — ABNORMAL HIGH (ref 1.4–7.0)
Neutrophils: 68 %
Platelets: 442 10*3/uL — ABNORMAL HIGH (ref 150–379)
RBC: 4.51 x10E6/uL (ref 3.77–5.28)
RDW: 15.3 % (ref 12.3–15.4)
WBC: 10.7 10*3/uL (ref 3.4–10.8)

## 2015-09-29 LAB — BASIC METABOLIC PANEL
BUN/Creatinine Ratio: 19 (ref 11–26)
BUN: 13 mg/dL (ref 8–27)
CO2: 24 mmol/L (ref 18–29)
Calcium: 9.8 mg/dL (ref 8.7–10.3)
Chloride: 100 mmol/L (ref 96–106)
Creatinine, Ser: 0.69 mg/dL (ref 0.57–1.00)
GFR calc Af Amer: 108 mL/min/{1.73_m2} (ref >59)
GFR calc non Af Amer: 94 mL/min/{1.73_m2} (ref >59)
Glucose: 96 mg/dL (ref 65–99)
Potassium: 3.7 mmol/L (ref 3.5–5.2)
Sodium: 142 mmol/L (ref 134–144)

## 2015-09-29 LAB — LIPID PANEL
Chol/HDL Ratio: 3.3 ratio units (ref 0.0–4.4)
Cholesterol, Total: 223 mg/dL — ABNORMAL HIGH (ref 100–199)
HDL: 67 mg/dL (ref >39)
LDL Calculated: 113 mg/dL — ABNORMAL HIGH (ref 0–99)
Triglycerides: 216 mg/dL — ABNORMAL HIGH (ref 0–149)
VLDL Cholesterol Cal: 43 mg/dL — ABNORMAL HIGH (ref 5–40)

## 2015-09-29 LAB — HIV ANTIBODY (ROUTINE TESTING W REFLEX): HIV Screen 4th Generation wRfx: NONREACTIVE

## 2015-09-29 LAB — HEMOGLOBIN A1C
Est. average glucose Bld gHb Est-mCnc: 117 mg/dL
Hgb A1c MFr Bld: 5.7 % — ABNORMAL HIGH (ref 4.8–5.6)

## 2015-10-05 ENCOUNTER — Other Ambulatory Visit: Payer: Self-pay | Admitting: *Deleted

## 2015-10-05 MED ORDER — OXYCODONE HCL ER 10 MG PO T12A: EXTENDED_RELEASE_TABLET | ORAL | Status: DC

## 2015-10-05 NOTE — Telephone Encounter (Signed)
Patient requested and will pick up 

## 2015-10-10 ENCOUNTER — Telehealth: Payer: Self-pay

## 2015-10-10 NOTE — Telephone Encounter (Signed)
Letter mailed to patient regarding overdue mammogram. M.Simpson 

## 2015-10-11 ENCOUNTER — Encounter: Payer: Self-pay | Admitting: Internal Medicine

## 2015-10-12 ENCOUNTER — Other Ambulatory Visit: Payer: Self-pay | Admitting: *Deleted

## 2015-10-12 MED ORDER — ZOLPIDEM TARTRATE 10 MG PO TABS: 10.0000 mg | ORAL_TABLET | Freq: Every evening | ORAL | Status: DC | PRN

## 2015-10-12 NOTE — Telephone Encounter (Signed)
[facility] 

## 2015-10-15 ENCOUNTER — Other Ambulatory Visit: Payer: Self-pay | Admitting: *Deleted

## 2015-10-15 MED ORDER — ZOLPIDEM TARTRATE 10 MG PO TABS: 10.0000 mg | ORAL_TABLET | Freq: Every evening | ORAL | Status: DC | PRN

## 2015-10-15 NOTE — Telephone Encounter (Signed)
[facility] 

## 2015-11-06 ENCOUNTER — Other Ambulatory Visit: Payer: Self-pay

## 2015-11-06 MED ORDER — OXYCODONE HCL ER 10 MG PO T12A: EXTENDED_RELEASE_TABLET | ORAL | Status: DC

## 2015-11-12 ENCOUNTER — Other Ambulatory Visit: Payer: Self-pay | Admitting: *Deleted

## 2015-11-12 MED ORDER — LISINOPRIL 20 MG PO TABS: ORAL_TABLET | ORAL | Status: DC

## 2015-11-12 NOTE — Telephone Encounter (Signed)
[facility] 

## 2015-11-21 ENCOUNTER — Telehealth: Payer: Self-pay

## 2015-11-21 MED ORDER — ZOLPIDEM TARTRATE 10 MG PO TABS: 10.0000 mg | ORAL_TABLET | Freq: Every evening | ORAL | Status: DC | PRN

## 2015-11-21 NOTE — Telephone Encounter (Signed)
Message left on triage voicemail- please return call  I called patient back: Left message on voicemail for patient to return call when available

## 2015-11-21 NOTE — Telephone Encounter (Signed)
Patient called for a refill on ambien. RX was phoned in to [facility]

## 2015-12-06 ENCOUNTER — Other Ambulatory Visit: Payer: Self-pay | Admitting: *Deleted

## 2015-12-06 MED ORDER — OXYCODONE HCL ER 10 MG PO T12A: EXTENDED_RELEASE_TABLET | ORAL | Status: DC

## 2015-12-06 NOTE — Telephone Encounter (Signed)
Patient requested and will pick up 

## 2015-12-13 ENCOUNTER — Other Ambulatory Visit: Payer: Self-pay | Admitting: *Deleted

## 2015-12-13 DIAGNOSIS — F411 Generalized anxiety disorder: Secondary | ICD-10-CM

## 2015-12-13 MED ORDER — LORAZEPAM 0.5 MG PO TABS: ORAL_TABLET | ORAL | Status: DC

## 2015-12-18 ENCOUNTER — Other Ambulatory Visit: Payer: Self-pay | Admitting: *Deleted

## 2015-12-18 DIAGNOSIS — R1011 Right upper quadrant pain: Principal | ICD-10-CM

## 2015-12-18 DIAGNOSIS — G8929 Other chronic pain: Secondary | ICD-10-CM

## 2015-12-18 MED ORDER — AMLODIPINE BESYLATE 10 MG PO TABS: 10.0000 mg | ORAL_TABLET | Freq: Every day | ORAL | Status: DC

## 2015-12-18 MED ORDER — OMEPRAZOLE 20 MG PO CPDR: DELAYED_RELEASE_CAPSULE | ORAL | Status: DC

## 2015-12-18 NOTE — Telephone Encounter (Signed)
[facility] 

## 2015-12-21 ENCOUNTER — Other Ambulatory Visit: Payer: Self-pay

## 2015-12-21 MED ORDER — ZOLPIDEM TARTRATE 10 MG PO TABS: 10.0000 mg | ORAL_TABLET | Freq: Every evening | ORAL | Status: DC | PRN

## 2015-12-26 ENCOUNTER — Emergency Department (HOSPITAL_BASED_OUTPATIENT_CLINIC_OR_DEPARTMENT_OTHER)
Admission: EM | Admit: 2015-12-26 | Discharge: 2015-12-26 | Disposition: A | Discharge status: Home / Self Care | Payer: PRIVATE HEALTH INSURANCE | Attending: Emergency Medicine | Admitting: Emergency Medicine

## 2015-12-26 ENCOUNTER — Encounter (HOSPITAL_BASED_OUTPATIENT_CLINIC_OR_DEPARTMENT_OTHER): Payer: Self-pay | Admitting: *Deleted

## 2015-12-26 DIAGNOSIS — R000 Tachycardia, unspecified: Secondary | ICD-10-CM | POA: Diagnosis not present

## 2015-12-26 DIAGNOSIS — I10 Essential (primary) hypertension: Secondary | ICD-10-CM | POA: Insufficient documentation

## 2015-12-26 DIAGNOSIS — R1010 Upper abdominal pain, unspecified: Secondary | ICD-10-CM | POA: Diagnosis not present

## 2015-12-26 DIAGNOSIS — R197 Diarrhea, unspecified: Secondary | ICD-10-CM | POA: Insufficient documentation

## 2015-12-26 DIAGNOSIS — R112 Nausea with vomiting, unspecified: Secondary | ICD-10-CM | POA: Diagnosis present

## 2015-12-26 DIAGNOSIS — F17210 Nicotine dependence, cigarettes, uncomplicated: Secondary | ICD-10-CM | POA: Diagnosis not present

## 2015-12-26 DIAGNOSIS — R1110 Vomiting, unspecified: Secondary | ICD-10-CM

## 2015-12-26 DIAGNOSIS — G8929 Other chronic pain: Secondary | ICD-10-CM | POA: Diagnosis not present

## 2015-12-26 DIAGNOSIS — R109 Unspecified abdominal pain: Secondary | ICD-10-CM

## 2015-12-26 LAB — COMPREHENSIVE METABOLIC PANEL
ALK PHOS: 143 U/L — AB (ref 38–126)
ALT: 16 U/L (ref 14–54)
ANION GAP: 12 (ref 5–15)
AST: 21 U/L (ref 15–41)
Albumin: 4.8 g/dL (ref 3.5–5.0)
BUN: 21 mg/dL — ABNORMAL HIGH (ref 6–20)
CALCIUM: 9.5 mg/dL (ref 8.9–10.3)
CO2: 19 mmol/L — AB (ref 22–32)
Chloride: 108 mmol/L (ref 101–111)
Creatinine, Ser: 0.99 mg/dL (ref 0.44–1.00)
GFR calc non Af Amer: 60 mL/min — ABNORMAL LOW (ref >60)
Glucose, Bld: 158 mg/dL — ABNORMAL HIGH (ref 65–99)
Potassium: 3.6 mmol/L (ref 3.5–5.1)
SODIUM: 139 mmol/L (ref 135–145)
Total Bilirubin: 1.1 mg/dL (ref 0.3–1.2)
Total Protein: 8.2 g/dL — ABNORMAL HIGH (ref 6.5–8.1)

## 2015-12-26 LAB — CBC
HCT: 46 % (ref 36.0–46.0)
HEMOGLOBIN: 15.6 g/dL — AB (ref 12.0–15.0)
MCH: 31.6 pg (ref 26.0–34.0)
MCHC: 33.9 g/dL (ref 30.0–36.0)
MCV: 93.1 fL (ref 78.0–100.0)
PLATELETS: 349 10*3/uL (ref 150–400)
RBC: 4.94 MIL/uL (ref 3.87–5.11)
RDW: 14.3 % (ref 11.5–15.5)
WBC: 8.1 10*3/uL (ref 4.0–10.5)

## 2015-12-26 MED ORDER — ONDANSETRON HCL 4 MG/2ML IJ SOLN
4.0000 mg | Freq: Once | INTRAMUSCULAR | Status: AC
  Administered 2015-12-26: 4 mg via INTRAVENOUS

## 2015-12-26 MED ORDER — ONDANSETRON 4 MG PO TBDP: ORAL_TABLET | ORAL | Status: DC

## 2015-12-26 MED ORDER — SODIUM CHLORIDE 0.9 % IV BOLUS (SEPSIS)
1000.0000 mL | Freq: Once | INTRAVENOUS | Status: AC
  Administered 2015-12-26: 1000 mL via INTRAVENOUS

## 2015-12-26 MED ORDER — ONDANSETRON HCL 4 MG/2ML IJ SOLN
4.0000 mg | Freq: Once | INTRAMUSCULAR | Status: DC
  Filled 2015-12-26: qty 2

## 2015-12-26 MED ORDER — LORAZEPAM 2 MG/ML IJ SOLN
1.0000 mg | Freq: Once | INTRAMUSCULAR | Status: AC
  Administered 2015-12-26: 1 mg via INTRAVENOUS
  Filled 2015-12-26: qty 1

## 2015-12-26 MED ORDER — ACETAMINOPHEN 325 MG PO TABS
650.0000 mg | ORAL_TABLET | Freq: Once | ORAL | Status: AC
  Administered 2015-12-26: 650 mg via ORAL
  Filled 2015-12-26: qty 2

## 2015-12-26 NOTE — ED Notes (Signed)
C/o n/v/d x today-pt arrived with emesis covered clothes-taken to tx area-disrobed and given gown-NAD-steady gait

## 2015-12-26 NOTE — ED Provider Notes (Signed)
CSN: [id number]     Arrival date & time 12/26/15  1505 History   First MD Initiated Contact with Patient 12/26/15 1547     Chief Complaint  Patient presents with  . Emesis     (Consider location/radiation/quality/duration/timing/severity/associated sxs/prior Treatment) HPI  60 year old female with multiple episodes of vomiting and diarrhea that started this morning around 8 AM. She has been having issues like this for 17 years since she had her gallbladder taken out. Patient states this is not different than typical. No blood in emesis or diarrhea. Has felt some chills and a subjective fever. Has been around a family that has had similar symptoms. Has chronic abdominal pain in her upper abdomen that is not worse than typical. She has this pain daily. Patient denies urinary symptoms.  Past Medical History  Diagnosis Date  . Hypertension   . Abdominal pain 2000    chronic abdominal pain with nausea and vomiting  . Allergy   . Abnormal liver function    Past Surgical History  Procedure Laterality Date  . Abdominal hysterectomy    . Cholecystectomy     Family History  Problem Relation Age of Onset  . Hypertension Father   . Cancer - Other Father     Bladder  . Hypertension Mother   . Diabetes Maternal Grandmother   . Cancer - Other Maternal Grandmother     Pancreatic  . Cancer - Other Paternal Grandmother     Bone  . Cancer Father   . Cancer Father   . Cancer Father    Social History  Substance Use Topics  . Smoking status: Current Every Day Smoker -- 1.00 packs/day    Types: Cigarettes  . Smokeless tobacco: Never Used  . Alcohol Use: Yes     Comment: socially   OB History    No data available     Review of Systems  Constitutional: Negative for fever.  Gastrointestinal: Positive for nausea, vomiting, abdominal pain and diarrhea. Negative for blood in stool.  Genitourinary: Negative for dysuria.  All other systems reviewed and are negative.     Allergies   Avelox; Compazine; Ibuprofen; Morphine and related; and Promethazine hcl  Home Medications   Prior to Admission medications   Medication Sig Start Date End Date Taking? Authorizing Provider  amLODipine (NORVASC) 10 MG tablet Take 1 tablet (10 mg total) by mouth daily. 12/18/15   Tiffany L Reed, DO  Cholecalciferol (VITAMIN D) 2000 UNITS CAPS Take 1 capsule (2,000 Units total) by mouth daily. 08/31/14   Tiffany L Reed, DO  lisinopril (PRINIVIL,ZESTRIL) 20 MG tablet Take one tablet by mouth once daily for blood pressure 11/12/15   Tiffany L Reed, DO  LORazepam (ATIVAN) 0.5 MG tablet Take one tablet every 6 hours as needed for anxiety 12/13/15   Tiffany L Reed, DO  metoprolol tartrate (LOPRESSOR) 25 MG tablet Take 1 tablet (25 mg total) by mouth 2 (two) times daily. 03/08/15   Tiffany L Reed, DO  omeprazole (PRILOSEC) 20 MG capsule Take one capsule by mouth once daily for stomach 12/18/15   Tiffany L Reed, DO  oxyCODONE (OXYCONTIN) 10 mg 12 hr tablet Take one tablet by mouth every 12 hours for pain 12/06/15   Tiffany L Reed, DO  simvastatin (ZOCOR) 20 MG tablet Take 1 tablet (20 mg total) by mouth at bedtime. 03/02/15   Tiffany L Reed, DO  zolpidem (AMBIEN) 10 MG tablet Take 1 tablet (10 mg total) by mouth at bedtime as needed. For  sleep 12/21/15   Tiffany L Reed, DO   BP 123/81 mmHg  Pulse 113  Temp(Src) 99.3 F (37.4 C) (Oral)  Resp 16  Ht 5\' 5"  (1.651 m)  Wt 155 lb (70.308 kg)  BMI 25.79 kg/m2  SpO2 99% Physical Exam  Constitutional: She is oriented to person, place, and time. She appears well-developed and well-nourished.  HENT:  Head: Normocephalic and atraumatic.  Right Ear: External ear normal.  Left Ear: External ear normal.  Nose: Nose normal.  Mouth/Throat: Mucous membranes are dry.  Eyes: Right eye exhibits no discharge. Left eye exhibits no discharge.  Cardiovascular: Regular rhythm and normal heart sounds.  Tachycardia present.   HR in low 100s  Pulmonary/Chest: Effort normal and  breath sounds normal.  Abdominal: Soft. She exhibits no distension. There is no tenderness.  Neurological: She is alert and oriented to person, place, and time.  Skin: Skin is warm and dry.  Nursing note and vitals reviewed.   ED Course  Procedures (including critical care time) Labs Review Labs Reviewed  CBC - Abnormal; Notable for the following:    Hemoglobin 15.6 (*)    All other components within normal limits  COMPREHENSIVE METABOLIC PANEL - Abnormal; Notable for the following:    CO2 19 (*)    Glucose, Bld 158 (*)    BUN 21 (*)    Total Protein 8.2 (*)    Alkaline Phosphatase 143 (*)    GFR calc non Af Amer 60 (*)    All other components within normal limits    Imaging Review No results found. I have personally reviewed and evaluated these images and lab results as part of my medical decision-making.   EKG Interpretation None      MDM   Final diagnoses:  Vomiting and diarrhea  Chronic abdominal pain    This appears to be an exacerbation of the patient's chronic vomiting and diarrhea. She is requesting IV Ativan for her nausea. She has not vomited while in the ED. She seems to frequently ask for IV Ativan whenever she comes into the ED. Has been hydrated with IV fluids and tachycardia is improving. Electrolytes show no significant change or abnormalities. Plan to discharge home with antibiotics and follow-up with PCP.    Sherwood Gambler, MD 12/26/15 [phone]

## 2016-01-04 ENCOUNTER — Other Ambulatory Visit: Payer: Self-pay | Admitting: *Deleted

## 2016-01-04 MED ORDER — OXYCODONE HCL ER 10 MG PO T12A: EXTENDED_RELEASE_TABLET | ORAL | Status: DC

## 2016-01-04 NOTE — Telephone Encounter (Signed)
Patient requested and will pick up 

## 2016-01-04 NOTE — Telephone Encounter (Signed)
Patient notified to pick up 

## 2016-01-18 ENCOUNTER — Other Ambulatory Visit: Payer: Self-pay | Admitting: *Deleted

## 2016-01-18 MED ORDER — ZOLPIDEM TARTRATE 10 MG PO TABS: 10.0000 mg | ORAL_TABLET | Freq: Every evening | ORAL | Status: DC | PRN

## 2016-01-18 NOTE — Telephone Encounter (Signed)
[facility] 

## 2016-01-31 ENCOUNTER — Telehealth: Payer: Self-pay

## 2016-01-31 NOTE — Telephone Encounter (Signed)
Patient called to find out when her refill was due for her oxycodone, I told her it was due on the 12 of June and when it was ready for pick up someone would call her.

## 2016-02-01 ENCOUNTER — Other Ambulatory Visit: Payer: Self-pay

## 2016-02-01 MED ORDER — OXYCODONE HCL ER 10 MG PO T12A: EXTENDED_RELEASE_TABLET | ORAL | Status: DC

## 2016-02-01 NOTE — Telephone Encounter (Signed)
Spoke with patient and advised rx ready for pick-up and it will be at the front desk.  

## 2016-02-12 ENCOUNTER — Other Ambulatory Visit: Payer: Self-pay | Admitting: *Deleted

## 2016-02-12 DIAGNOSIS — F411 Generalized anxiety disorder: Secondary | ICD-10-CM

## 2016-02-12 MED ORDER — LORAZEPAM 0.5 MG PO TABS: ORAL_TABLET | ORAL | Status: DC

## 2016-02-12 NOTE — Telephone Encounter (Signed)
[facility] 

## 2016-02-18 ENCOUNTER — Other Ambulatory Visit: Payer: Self-pay | Admitting: *Deleted

## 2016-02-18 MED ORDER — ZOLPIDEM TARTRATE 10 MG PO TABS: 10.0000 mg | ORAL_TABLET | Freq: Every evening | ORAL | Status: DC | PRN

## 2016-02-18 NOTE — Telephone Encounter (Signed)
[facility] 

## 2016-02-29 ENCOUNTER — Other Ambulatory Visit: Payer: Self-pay

## 2016-02-29 MED ORDER — OXYCODONE HCL ER 10 MG PO T12A: EXTENDED_RELEASE_TABLET | ORAL | Status: DC

## 2016-03-19 ENCOUNTER — Other Ambulatory Visit: Payer: Self-pay | Admitting: Internal Medicine

## 2016-03-20 ENCOUNTER — Other Ambulatory Visit: Payer: Self-pay | Admitting: *Deleted

## 2016-03-27 ENCOUNTER — Encounter: Payer: Self-pay | Admitting: Internal Medicine

## 2016-03-27 ENCOUNTER — Ambulatory Visit (INDEPENDENT_AMBULATORY_CARE_PROVIDER_SITE_OTHER): Payer: PRIVATE HEALTH INSURANCE | Admitting: Internal Medicine

## 2016-03-27 VITALS — BP 140/80 | HR 104 | Temp 98.5°F | Wt 154.0 lb

## 2016-03-27 DIAGNOSIS — R1010 Upper abdominal pain, unspecified: Secondary | ICD-10-CM | POA: Diagnosis not present

## 2016-03-27 DIAGNOSIS — R0989 Other specified symptoms and signs involving the circulatory and respiratory systems: Secondary | ICD-10-CM | POA: Diagnosis not present

## 2016-03-27 DIAGNOSIS — R740 Nonspecific elevation of levels of transaminase and lactic acid dehydrogenase [LDH]: Secondary | ICD-10-CM | POA: Diagnosis not present

## 2016-03-27 DIAGNOSIS — R1011 Right upper quadrant pain: Secondary | ICD-10-CM

## 2016-03-27 DIAGNOSIS — F17200 Nicotine dependence, unspecified, uncomplicated: Secondary | ICD-10-CM

## 2016-03-27 DIAGNOSIS — G8929 Other chronic pain: Secondary | ICD-10-CM

## 2016-03-27 DIAGNOSIS — R7401 Elevation of levels of liver transaminase levels: Secondary | ICD-10-CM

## 2016-03-27 DIAGNOSIS — M8580 Other specified disorders of bone density and structure, unspecified site: Secondary | ICD-10-CM | POA: Diagnosis not present

## 2016-03-27 DIAGNOSIS — R030 Elevated blood-pressure reading, without diagnosis of hypertension: Secondary | ICD-10-CM | POA: Diagnosis not present

## 2016-03-27 DIAGNOSIS — I10 Essential (primary) hypertension: Secondary | ICD-10-CM

## 2016-03-27 DIAGNOSIS — R000 Tachycardia, unspecified: Secondary | ICD-10-CM

## 2016-03-27 MED ORDER — METOPROLOL TARTRATE 25 MG PO TABS: 25.0000 mg | ORAL_TABLET | Freq: Two times a day (BID) | ORAL | 3 refills | Status: DC

## 2016-03-27 MED ORDER — SIMVASTATIN 20 MG PO TABS: 20.0000 mg | ORAL_TABLET | Freq: Every day | ORAL | 3 refills | Status: DC

## 2016-03-27 MED ORDER — OXYCODONE HCL ER 10 MG PO T12A: EXTENDED_RELEASE_TABLET | ORAL | 0 refills | Status: DC

## 2016-03-27 NOTE — Progress Notes (Signed)
Location:  [facility] clinic Provider:  Sammi Stolarz L. Mariea Clonts, D.O., C.M.D.  Code Status: full code Goals of Care:  Advanced Directives 03/27/2016  Does patient have an advance directive? No  Would patient like information on creating an advanced directive? -     Chief Complaint  Patient presents with  . Medical Management of Chronic Issues    40mth follow-up    HPI: Patient is a 60 y.o. female seen today for medical management of chronic diseases.    Has plans for dentures after a tooth broke off.  Abdominal pain:  Had one episode 12/26/15.  Whole household had a virus and terrible diarrhea.  BUN/cr elevated at ED.    Went to [employer] with a patient.    BP and HR up this am.  Didn't take pills yet.  Came straight from work.    Stressed with her father who is losing weight.  Mom is the same.  Still smoking but not much b/c she cannot smoke at work.    Past Medical History:  Diagnosis Date  . Abdominal pain 2000   chronic abdominal pain with nausea and vomiting  . Abnormal liver function   . Allergy   . Hypertension     Past Surgical History:  Procedure Laterality Date  . ABDOMINAL HYSTERECTOMY    . CHOLECYSTECTOMY      Allergies  Allergen Reactions  . Avelox [Moxifloxacin Hcl In Nacl]     Low blood pressure  . Compazine [Prochlorperazine Edisylate] Other (See Comments)    Caused lockjaw  . Ibuprofen Hives  . Morphine And Related Nausea And Vomiting  . Promethazine Hcl Nausea And Vomiting      Medication List       Accurate as of 03/27/16  8:57 AM. Always use your most recent med list.          amLODipine 10 MG tablet Commonly known as:  NORVASC Take 1 tablet (10 mg total) by mouth daily.   lisinopril 20 MG tablet Commonly known as:  PRINIVIL,ZESTRIL Take one tablet by mouth once daily for blood pressure   LORazepam 0.5 MG tablet Commonly known as:  ATIVAN Take one tablet every 6 hours as needed for anxiety   metoprolol tartrate 25 MG tablet Commonly  known as:  LOPRESSOR Take 1 tablet (25 mg total) by mouth 2 (two) times daily.   omeprazole 20 MG capsule Commonly known as:  PRILOSEC Take one capsule by mouth once daily for stomach   oxyCODONE 10 mg 12 hr tablet Commonly known as:  OXYCONTIN Take one tablet by mouth every 12 hours for pain   simvastatin 20 MG tablet Commonly known as:  ZOCOR Take 1 tablet (20 mg total) by mouth at bedtime.   Vitamin D 2000 units Caps Take 1 capsule (2,000 Units total) by mouth daily.   zolpidem 10 MG tablet Commonly known as:  AMBIEN take 1 tablet by mouth at bedtime for sleep       Review of Systems:  Review of Systems  Constitutional: Negative for chills and fever.  HENT: Negative for congestion.   Eyes: Negative for blurred vision.  Respiratory: Negative for shortness of breath.   Cardiovascular: Negative for chest pain, palpitations and leg swelling.  Gastrointestinal: Positive for abdominal pain and nausea. Negative for blood in stool, constipation, diarrhea, heartburn, melena and vomiting.  Genitourinary: Negative for dysuria.  Musculoskeletal: Negative for falls.  Skin: Negative for itching and rash.  Neurological: Negative for dizziness, loss of consciousness  and headaches.  Psychiatric/Behavioral: Negative for memory loss. The patient is nervous/anxious.     Health Maintenance  Topic Date Due  . ZOSTAVAX  09/19/2013  . INFLUENZA VACCINE  03/25/2016  . COLONOSCOPY  08/25/2016  . MAMMOGRAM  01/16/2017  . PAP SMEAR  10/10/2018  . TETANUS/TDAP  09/27/2025  . Hepatitis C Screening  Completed  . HIV Screening  Completed    Physical Exam: Vitals:   03/27/16 0852  BP: 140/88  Pulse: (!) 104  Temp: 98.5 F (36.9 C)  TempSrc: Oral  SpO2: 98%  Weight: 154 lb (69.9 kg)   Body mass index is 25.63 kg/m. Physical Exam  Constitutional: She is oriented to person, place, and time. She appears well-developed and well-nourished. No distress.  Cardiovascular: Intact distal  pulses.   tachycardic  Pulmonary/Chest: Effort normal and breath sounds normal. No respiratory distress.  Abdominal: Soft. Bowel sounds are normal.  Musculoskeletal: Normal range of motion.  Neurological: She is alert and oriented to person, place, and time.  Skin: Skin is warm and dry.  Psychiatric: She has a normal mood and affect.  anxious    Labs reviewed: Basic Metabolic Panel:  Recent Labs  09/28/15 0945 09/28/15 1855 12/26/15 1525  NA 142 140 139  K 3.7 3.0* 3.6  CL 100 101 108  CO2 24 24 19*  GLUCOSE 96 162* 158*  BUN 13 19 21*  CREATININE 0.69 0.76 0.99  CALCIUM 9.8 9.4 9.5   Liver Function Tests:  Recent Labs  09/28/15 0945 09/28/15 1855 12/26/15 1525  AST 17 22 21   ALT 14 16 16   ALKPHOS 163* 142* 143*  BILITOT 0.7 1.1 1.1  PROT 6.9 7.5 8.2*  ALBUMIN 4.5 4.5 4.8   No results for input(s): LIPASE, AMYLASE in the last 8760 hours. No results for input(s): AMMONIA in the last 8760 hours. CBC:  Recent Labs  09/28/15 0945 09/28/15 1855 12/26/15 1525  WBC 10.7 13.6* 8.1  NEUTROABS 7.2* 11.9*  --   HGB  --  13.3 15.6*  HCT 41.8 40.5 46.0  MCV 93 92.3 93.1  PLT 442* 383 349   Lipid Panel:  Recent Labs  09/28/15 0945  CHOL 223*  HDL 67  LDLCALC 113*  TRIG 216*  CHOLHDL 3.3   Lab Results  Component Value Date   HGBA1C 5.7 (H) 09/28/2015    Assessment/Plan 1. Essential hypertension -bp at upper limits today -she has not taken her meds yet as she just left work -cont same meds -also smoked and anxious at doctor - metoprolol tartrate (LOPRESSOR) 25 MG tablet; Take 1 tablet (25 mg total) by mouth 2 (two) times daily.  Dispense: 180 tablet; Refill: 3  2. Tachycardia with hypertension - cont bp med - metoprolol tartrate (LOPRESSOR) 25 MG tablet; Take 1 tablet (25 mg total) by mouth 2 (two) times daily.  Dispense: 180 tablet; Refill: 3  3. Tobacco use disorder - ongoing, says she is smoking less over time b/c of her job  4. Chronic RUQ  pain -has spells of this, only been to ED for it once since last visit -cont with her pain medication, nausea medication -last episode was actually in context of a viral gastroenteritis  5. Elevated transaminase level -f/u labs day of next appt, chronic also and etiology was never found   6. Osteopenia -per 2015 bone density--is taking her vitamin D supplement, but still smoking, not really exercising either  Labs/tests ordered: will plan to do cbc, cmp, flp, hba1c for hyperglycemia  net time Next appt:  07/31/2016 for med mgt with labs day of  Taelyn Nemes L. Yailene Badia, D.O. [facility] [address], [place] [address] Cell Phone (Mon-Fri 8am-5pm):  [phone] On Call:  [phone] & follow prompts after 5pm & weekends Office Phone:  [phone] Office Fax:  [phone]

## 2016-04-18 ENCOUNTER — Other Ambulatory Visit: Payer: Self-pay | Admitting: Internal Medicine

## 2016-04-18 DIAGNOSIS — G8929 Other chronic pain: Secondary | ICD-10-CM

## 2016-04-18 DIAGNOSIS — R1011 Right upper quadrant pain: Principal | ICD-10-CM

## 2016-04-24 ENCOUNTER — Other Ambulatory Visit: Payer: Self-pay | Admitting: Internal Medicine

## 2016-04-24 DIAGNOSIS — F411 Generalized anxiety disorder: Secondary | ICD-10-CM

## 2016-04-25 ENCOUNTER — Other Ambulatory Visit: Payer: Self-pay

## 2016-04-25 MED ORDER — OXYCODONE HCL ER 10 MG PO T12A: EXTENDED_RELEASE_TABLET | ORAL | 0 refills | Status: DC

## 2016-05-18 ENCOUNTER — Other Ambulatory Visit: Payer: Self-pay | Admitting: Internal Medicine

## 2016-05-18 DIAGNOSIS — R1011 Right upper quadrant pain: Principal | ICD-10-CM

## 2016-05-18 DIAGNOSIS — G8929 Other chronic pain: Secondary | ICD-10-CM

## 2016-05-23 ENCOUNTER — Other Ambulatory Visit: Payer: Self-pay | Admitting: *Deleted

## 2016-05-23 MED ORDER — ZOLPIDEM TARTRATE 10 MG PO TABS: ORAL_TABLET | ORAL | 0 refills | Status: DC

## 2016-05-23 NOTE — Telephone Encounter (Signed)
Patient requested refill to be faxed to pharmacy.  

## 2016-05-29 ENCOUNTER — Other Ambulatory Visit: Payer: Self-pay

## 2016-05-29 MED ORDER — OXYCODONE HCL ER 10 MG PO T12A: EXTENDED_RELEASE_TABLET | ORAL | 0 refills | Status: DC

## 2016-05-29 NOTE — Telephone Encounter (Signed)
Left message on machine that rx is ready for pick-up, and it will be at our front desk.  

## 2016-05-30 ENCOUNTER — Other Ambulatory Visit: Payer: Self-pay

## 2016-06-20 ENCOUNTER — Other Ambulatory Visit: Payer: Self-pay | Admitting: Internal Medicine

## 2016-06-25 ENCOUNTER — Encounter: Payer: Self-pay | Admitting: Family Medicine

## 2016-06-25 ENCOUNTER — Ambulatory Visit (INDEPENDENT_AMBULATORY_CARE_PROVIDER_SITE_OTHER): Payer: PRIVATE HEALTH INSURANCE

## 2016-06-25 ENCOUNTER — Telehealth: Payer: Self-pay | Admitting: Family Medicine

## 2016-06-25 ENCOUNTER — Ambulatory Visit (INDEPENDENT_AMBULATORY_CARE_PROVIDER_SITE_OTHER): Payer: Self-pay | Admitting: Family Medicine

## 2016-06-25 VITALS — BP 124/78 | HR 92 | Temp 98.3°F | Resp 18 | Ht 65.0 in | Wt 154.0 lb

## 2016-06-25 DIAGNOSIS — R05 Cough: Secondary | ICD-10-CM | POA: Diagnosis not present

## 2016-06-25 DIAGNOSIS — F17200 Nicotine dependence, unspecified, uncomplicated: Secondary | ICD-10-CM

## 2016-06-25 DIAGNOSIS — R059 Cough, unspecified: Secondary | ICD-10-CM

## 2016-06-25 DIAGNOSIS — M546 Pain in thoracic spine: Secondary | ICD-10-CM

## 2016-06-25 NOTE — Telephone Encounter (Signed)
Discussed xray result with patient. She verbalized understanding.  Notified her that her pain in the left upper back looks to be from arthritis and mild scoliosis.

## 2016-06-25 NOTE — Patient Instructions (Addendum)
     IF you received an x-ray today, you will receive an invoice from [facility]. Please contact [facility] at [phone] with questions or concerns regarding your invoice.   IF you received labwork today, you will receive an invoice from [facility]. Please contact [facility] at [phone] with questions or concerns regarding your invoice.   Our billing staff will not be able to assist you with questions regarding bills from these companies.  You will be contacted with the lab results as soon as they are available. The fastest way to get your results is to activate your My Chart account. Instructions are located on the last page of this paperwork. If you have not heard from us regarding the results in 2 weeks, please contact this office.      Cough, Adult Coughing is a reflex that clears your throat and your airways. Coughing helps to heal and protect your lungs. It is normal to cough occasionally, but a cough that happens with other symptoms or lasts a long time may be a sign of a condition that needs treatment. A cough may last only 2-3 weeks (acute), or it may last longer than 8 weeks (chronic). CAUSES Coughing is commonly caused by:  Breathing in substances that irritate your lungs.  A viral or bacterial respiratory infection.  Allergies.  Asthma.  Postnasal drip.  Smoking.  Acid backing up from the stomach into the esophagus (gastroesophageal reflux).  Certain medicines.  Chronic lung problems, including COPD (or rarely, lung cancer).  Other medical conditions such as heart failure. HOME CARE INSTRUCTIONS  Pay attention to any changes in your symptoms. Take these actions to help with your discomfort:  Take medicines only as told by your health care provider.  If you were prescribed an antibiotic medicine, take it as told by your health care provider. Do not stop taking the antibiotic even if you start to feel  better.  Talk with your health care provider before you take a cough suppressant medicine.  Drink enough fluid to keep your urine clear or pale yellow.  If the air is dry, use a cold steam vaporizer or humidifier in your bedroom or your home to help loosen secretions.  Avoid anything that causes you to cough at work or at home.  If your cough is worse at night, try sleeping in a semi-upright position.  Avoid cigarette smoke. If you smoke, quit smoking. If you need help quitting, ask your health care provider.  Avoid caffeine.  Avoid alcohol.  Rest as needed. SEEK MEDICAL CARE IF:   You have new symptoms.  You cough up pus.  Your cough does not get better after 2-3 weeks, or your cough gets worse.  You cannot control your cough with suppressant medicines and you are losing sleep.  You develop pain that is getting worse or pain that is not controlled with pain medicines.  You have a fever.  You have unexplained weight loss.  You have night sweats. SEEK IMMEDIATE MEDICAL CARE IF:  You cough up blood.  You have difficulty breathing.  Your heartbeat is very fast.   This information is not intended to replace advice given to you by your health care provider. Make sure you discuss any questions you have with your health care provider.   Document Released: 02/07/2011 Document Revised: 05/02/2015 Document Reviewed: 10/18/2014 Elsevier Interactive Patient Education 2016 Elsevier Inc.  

## 2016-06-25 NOTE — Progress Notes (Signed)
Chief Complaint  Patient presents with  . URI    3 WEEKS  . Cough    HPI   Upper Respiratory Infection: Patient complains of symptoms of a URI. Symptoms include cough. Onset of symptoms was 3 weeks ago, got better and now is getting worse. She also c/o congestion, sneezing, sore throat and wheezing for the past 3 weeks .  She also has some left upper back pain.  She is drinking plenty of fluids. Evaluation to date: none. Treatment to date: mucinex.She smokes 10 cigarettes a day and denies any history of asthma.   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   Tobacco Use Pt reports that she has been smoking and is not ready to quit She smokes 10 cigarettes a day She denies shortness of breath Reports occasoinal wheeze She states that she is not worried about it.  Her father is 63 and is still smoking.    Left side thoracic back pain She reports that since coughing for 3 weeks she has been having left side thoracic back pain that is 3/10 Reports that it is there all the time She denies history of trauma or injury She thinks it is from the coughing  Past Medical History:  Diagnosis Date  . Abdominal pain 2000   chronic abdominal pain with nausea and vomiting  . Abnormal liver function   . Allergy   . Hypertension     Current Outpatient Prescriptions  Medication Sig Dispense Refill  . amLODipine (NORVASC) 10 MG tablet take 1 tablet by mouth daily 30 tablet 5  . Cholecalciferol (VITAMIN D) 2000 UNITS CAPS Take 1 capsule (2,000 Units total) by mouth daily. 30 capsule 5  . lisinopril (PRINIVIL,ZESTRIL) 20 MG tablet take 1 tablet by mouth once daily for blood pressure 30 tablet 5  . LORazepam (ATIVAN) 0.5 MG tablet TAKE 1 TABLET EVERY 6 HOURS AS NEEDED FOR ANXIETY. 120 tablet 0  . metoprolol tartrate (LOPRESSOR) 25 MG tablet Take 1 tablet (25 mg total) by mouth 2 (two) times daily. 180 tablet 3  . omeprazole (PRILOSEC) 20 MG capsule take 1 capsule by  mouth once daily 30 capsule 5  . oxyCODONE (OXYCONTIN) 10 mg 12 hr tablet Take one tablet by mouth every 12 hours for pain 60 tablet 0  . simvastatin (ZOCOR) 20 MG tablet Take 1 tablet (20 mg total) by mouth at bedtime. 90 tablet 3  . zolpidem (AMBIEN) 10 MG tablet take 1 tablet by mouth at bedtime if needed for sleep 30 tablet 0   No current facility-administered medications for this visit.     Allergies:  Allergies  Allergen Reactions  . Avelox [Moxifloxacin Hcl In Nacl]     Low blood pressure  . Compazine [Prochlorperazine Edisylate] Other (See Comments)    Caused lockjaw  . Ibuprofen Hives  . Morphine And Related Nausea And Vomiting  . Promethazine Hcl Nausea And Vomiting    Past Surgical History:  Procedure Laterality Date  . ABDOMINAL HYSTERECTOMY    . CHOLECYSTECTOMY      Social History   Social History  . Marital status: Divorced    Spouse name: N/A  . Number of children: N/A  . Years of education: N/A   Social History Main Topics  . Smoking status: Current Every Day Smoker    Packs/day: 1.00    Types: Cigarettes  . Smokeless tobacco: Never Used  . Alcohol use Yes     Comment: socially  .  Drug use: No  . Sexual activity: Not Asked   Other Topics Concern  . None   Social History Narrative  . None    ROS  Objective: Vitals:   06/25/16 1001  BP: 124/78  Pulse: 92  Resp: 18  Temp: 98.3 F (36.8 C)  TempSrc: Oral  SpO2: 98%  Weight: 154 lb (69.9 kg)  Height: 5\' 5"  (1.651 m)    Physical Exam  General: alert, oriented, in NAD Head: normocephalic, atraumatic, no sinus tenderness Eyes: EOM intact, no scleral icterus or conjunctival injection Ears: TM clear bilaterally Throat: no pharyngeal exudate or erythema Lymph: no posterior auricular, submental or cervical lymph adenopathy Heart: normal rate, normal sinus rhythm, no murmurs Lungs: clear to auscultation bilaterally, no wheezing Back: non-tender in the left thoracic back     Assessment  and Plan Theresa Carey was seen today for uri and cough.  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

## 2016-06-27 ENCOUNTER — Other Ambulatory Visit: Payer: Self-pay | Admitting: *Deleted

## 2016-06-27 MED ORDER — OXYCODONE HCL ER 10 MG PO T12A: EXTENDED_RELEASE_TABLET | ORAL | 0 refills | Status: DC

## 2016-06-27 NOTE — Telephone Encounter (Signed)
Patient requested and will pick up 

## 2016-07-03 ENCOUNTER — Other Ambulatory Visit: Payer: Self-pay | Admitting: Internal Medicine

## 2016-07-03 DIAGNOSIS — F411 Generalized anxiety disorder: Secondary | ICD-10-CM

## 2016-07-11 ENCOUNTER — Telehealth: Payer: Self-pay

## 2016-07-11 NOTE — Telephone Encounter (Signed)
Noted  

## 2016-07-11 NOTE — Telephone Encounter (Signed)
Message left on triage voicemail as a FYI for Dr.Reed and assistant. Patient will get all of her top teeth pulled and will probably get a pain medication from the dentist.  Patient was told to inform Dr.Reed anytime she gets pain medication elsewhere.

## 2016-07-22 ENCOUNTER — Other Ambulatory Visit: Payer: Self-pay | Admitting: Internal Medicine

## 2016-07-24 IMAGING — CR DG RIBS W/ CHEST 3+V*L*
5 series · 5 of 5 positions shown · non-contrast
Comparison: Acute abdominal series [DATE] 09/14/2011

CLINICAL DATA: Fall, left side pain

EXAM:
LEFT RIBS AND CHEST - 3+ VIEW

[w ribs ap/pa upper left]
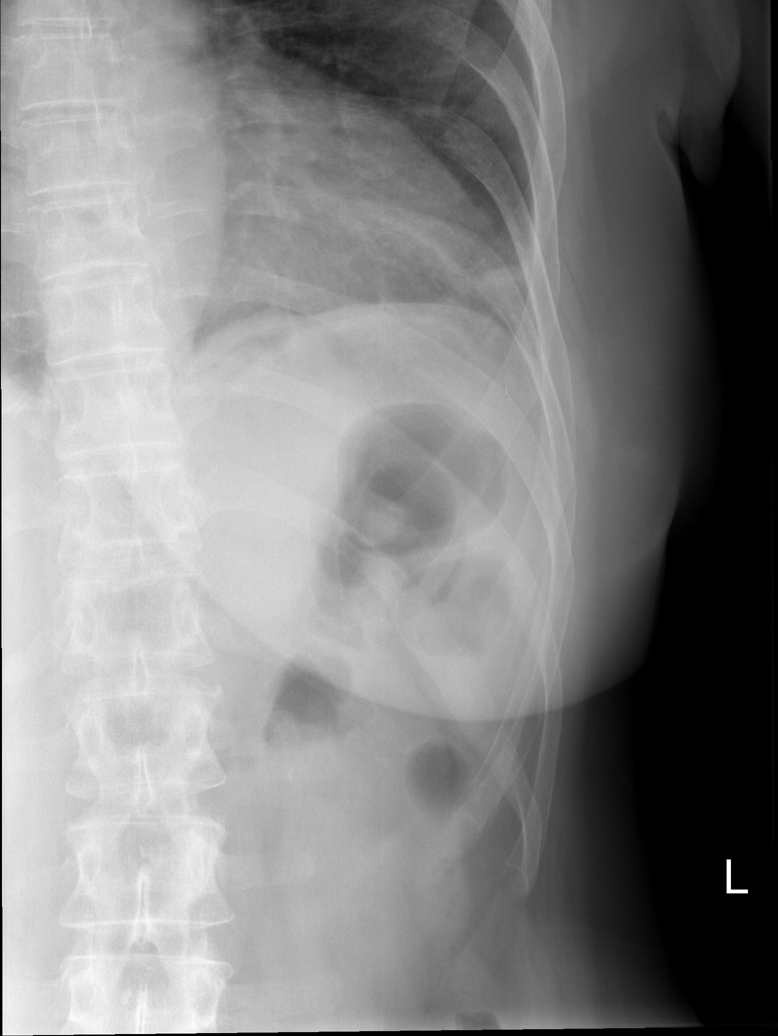

[w ribs ap/pa lower left]
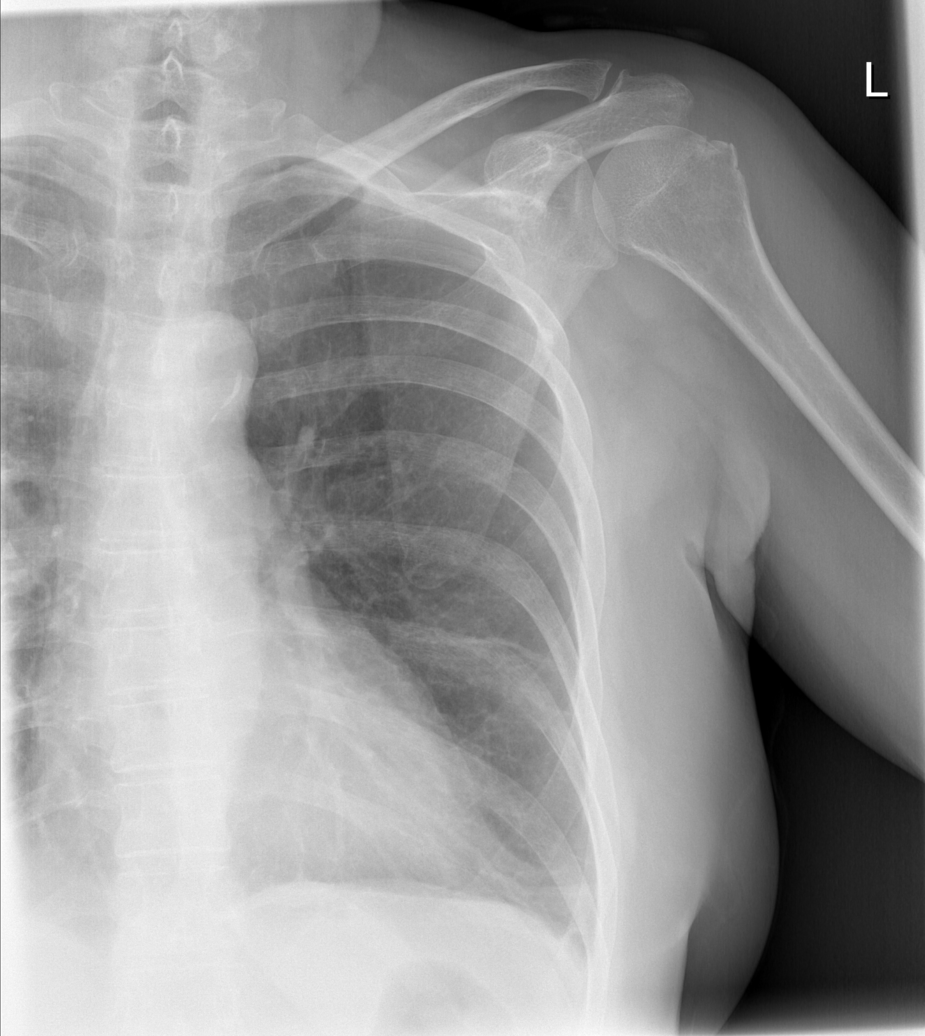

[w ribs oblique left]
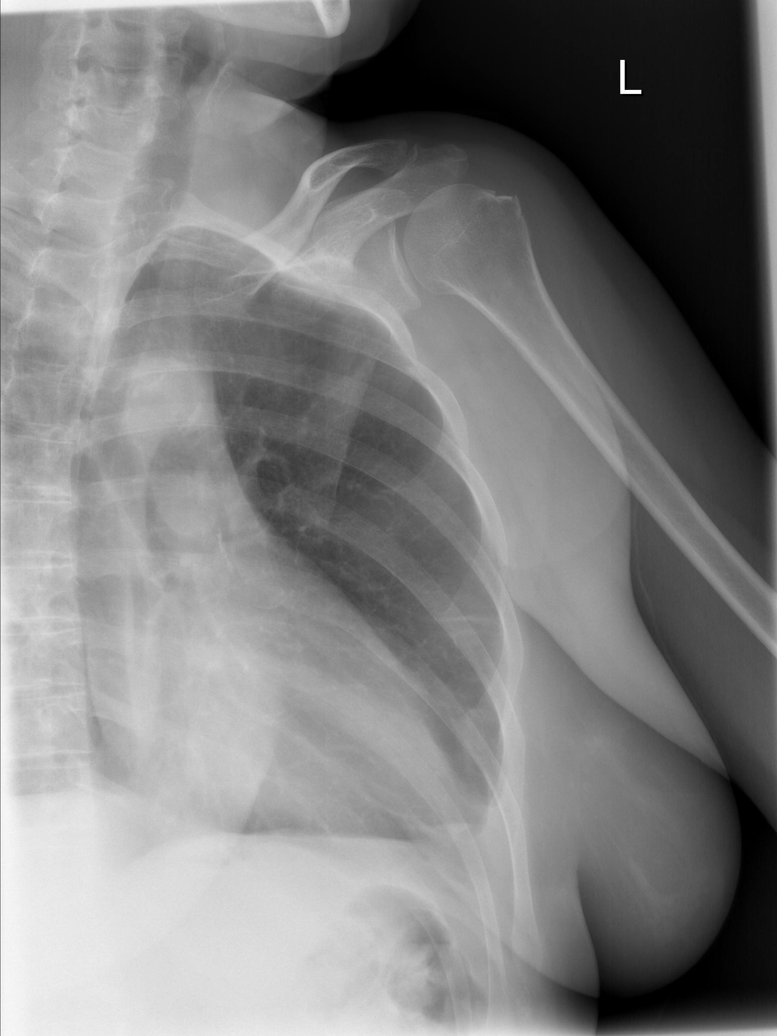

[w ribs oblique left *]
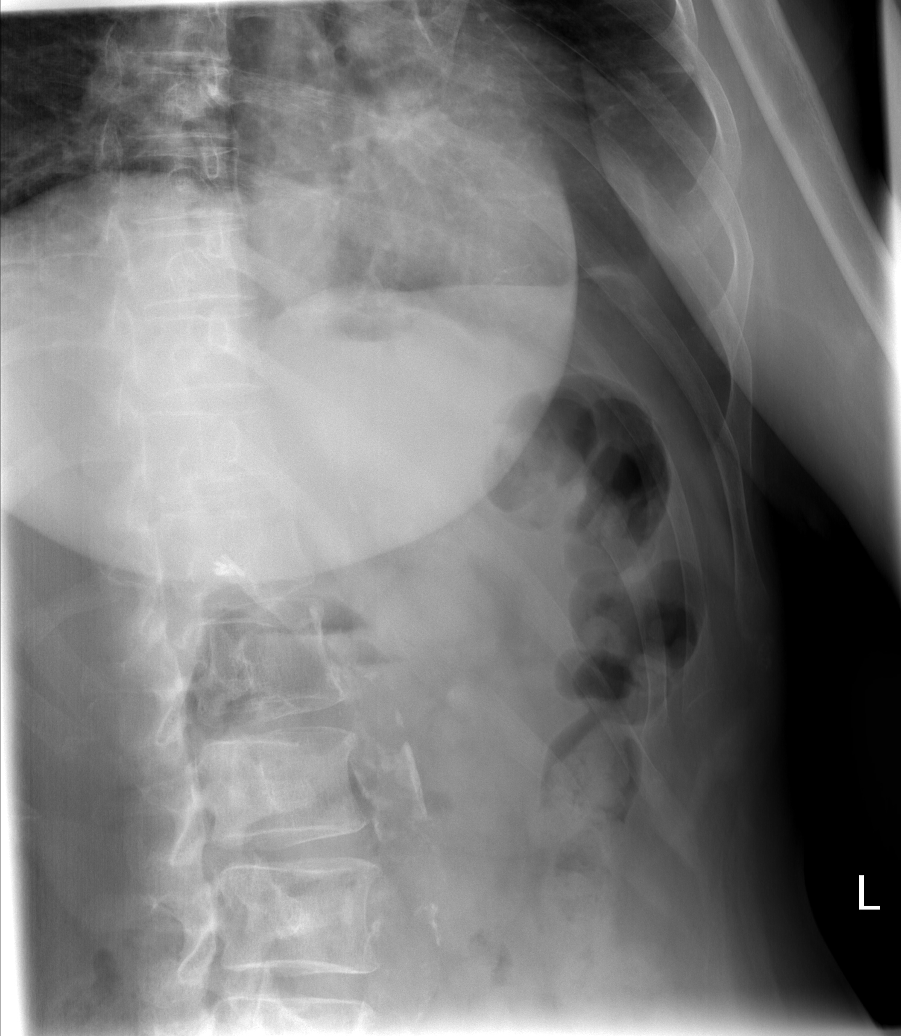

[w chest pa]
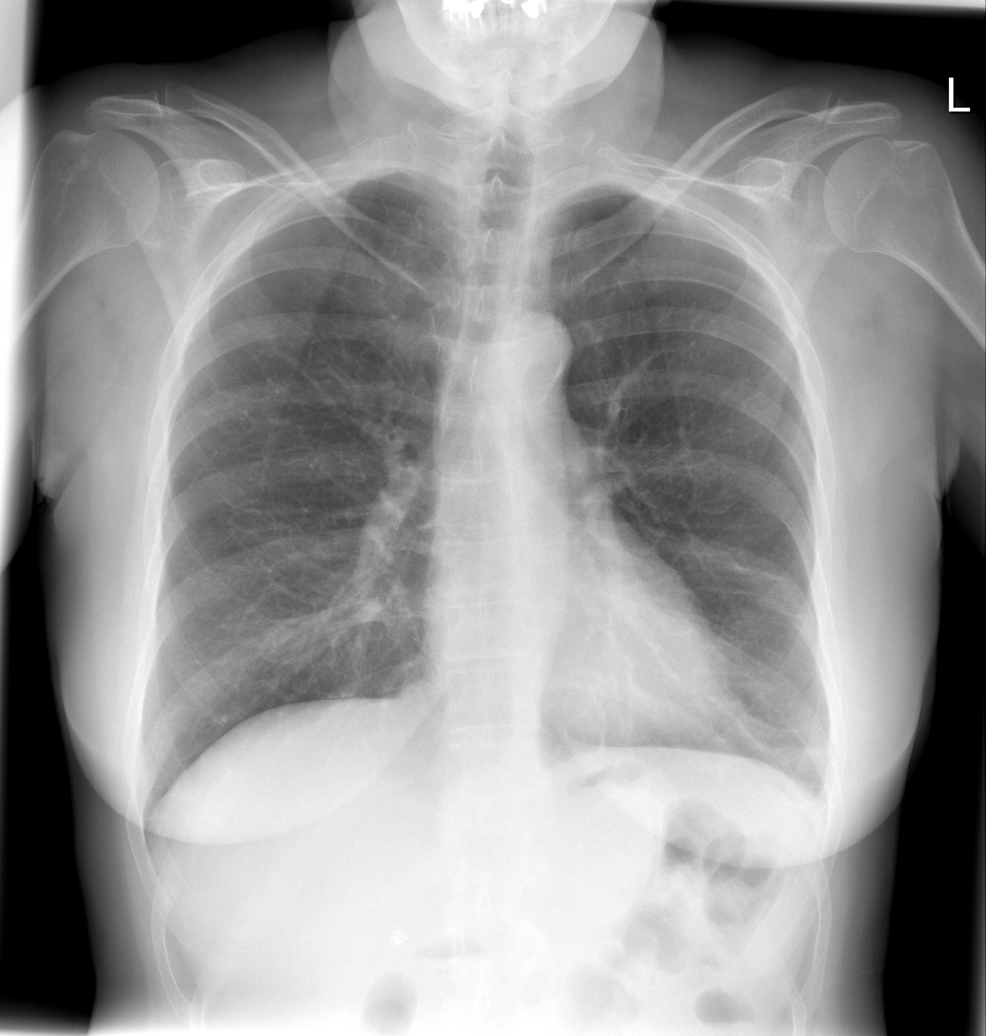

[5 of 5 positions shown; findings below may reference images not displayed]

FINDINGS: Cardiac and mediastinal contours are within normal limits.
Atherosclerotic calcifications present within the transverse aorta.
The lungs are clear. No pleural effusion, pneumothorax or focal
airspace consolidation. Trace linear opacities in the left lower
lung likely reflect areas of subsegmental atelectasis. Unremarkable
visualized upper abdominal bowel gas pattern. Surgical clips in the
right upper quadrant suggest prior cholecystectomy. No acute osseous
abnormality.
IMPRESSION: 1. Although no displaced rib fracture is identified, there is
evidence of subsegmental atelectasis and perhaps a trace effusion
versus subpleural thickening in the left lower lobe. Findings raise
suspicion for possible occult or nondisplaced rib fracture and
associated splinting.
2. Otherwise, no acute cardiopulmonary process.
3. Aortic atherosclerosis.

## 2016-07-24 IMAGING — CR DG HUMERUS 2V *L*
2 series · 2 of 2 positions shown · non-contrast
Comparison: None.

CLINICAL DATA: Fall.  Shoulder pain.

EXAM:
LEFT HUMERUS - 2+ VIEW

[w humerus ap left *]
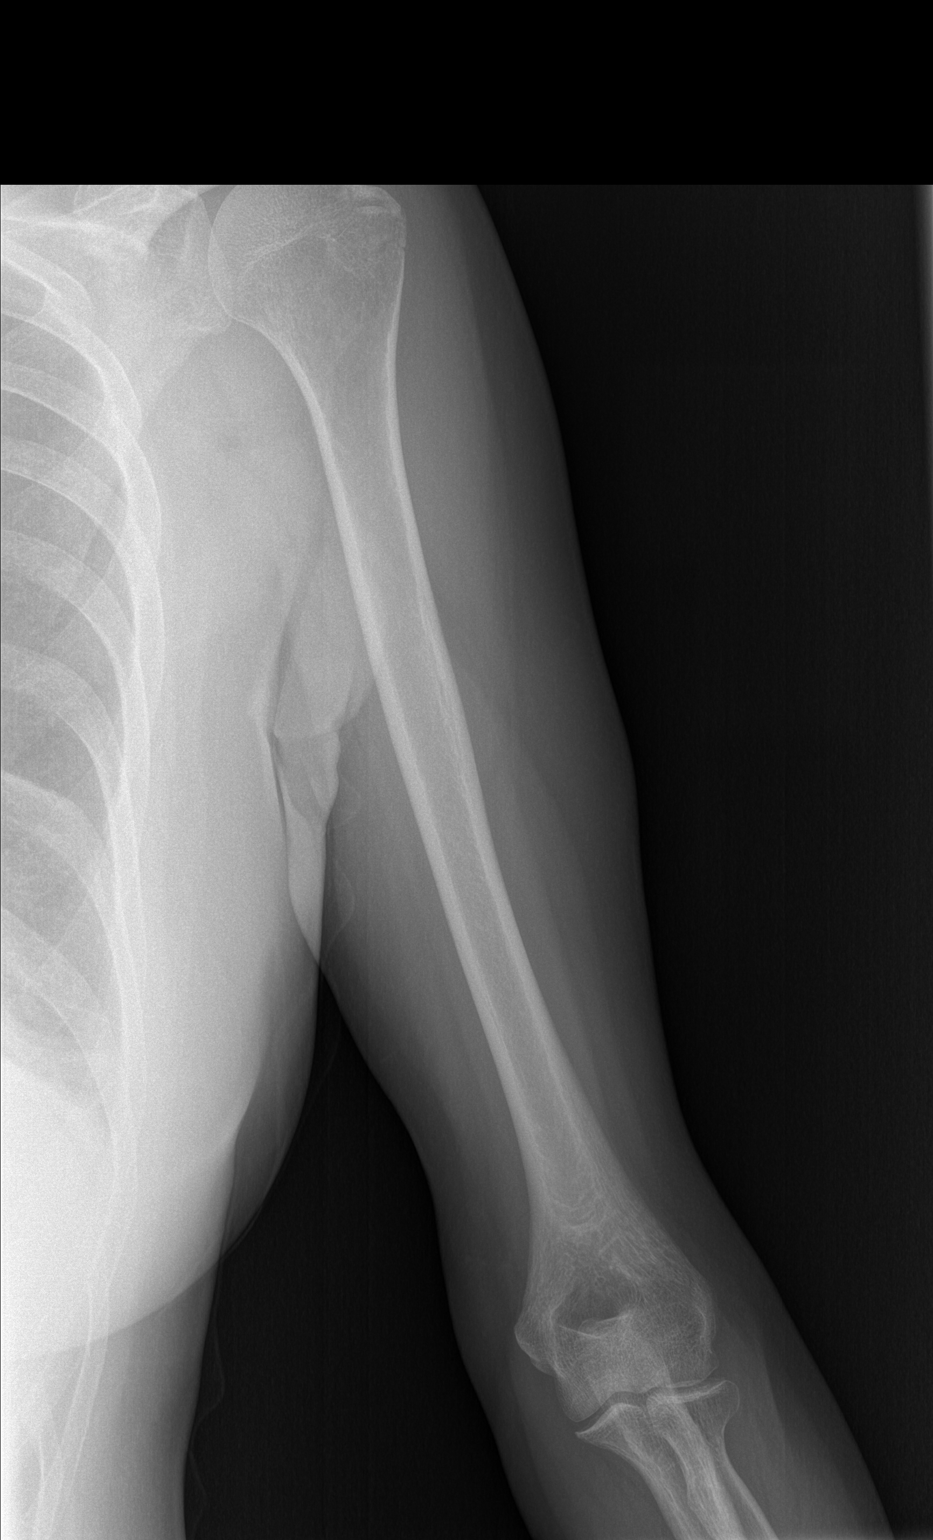

[w humerus lat left *]
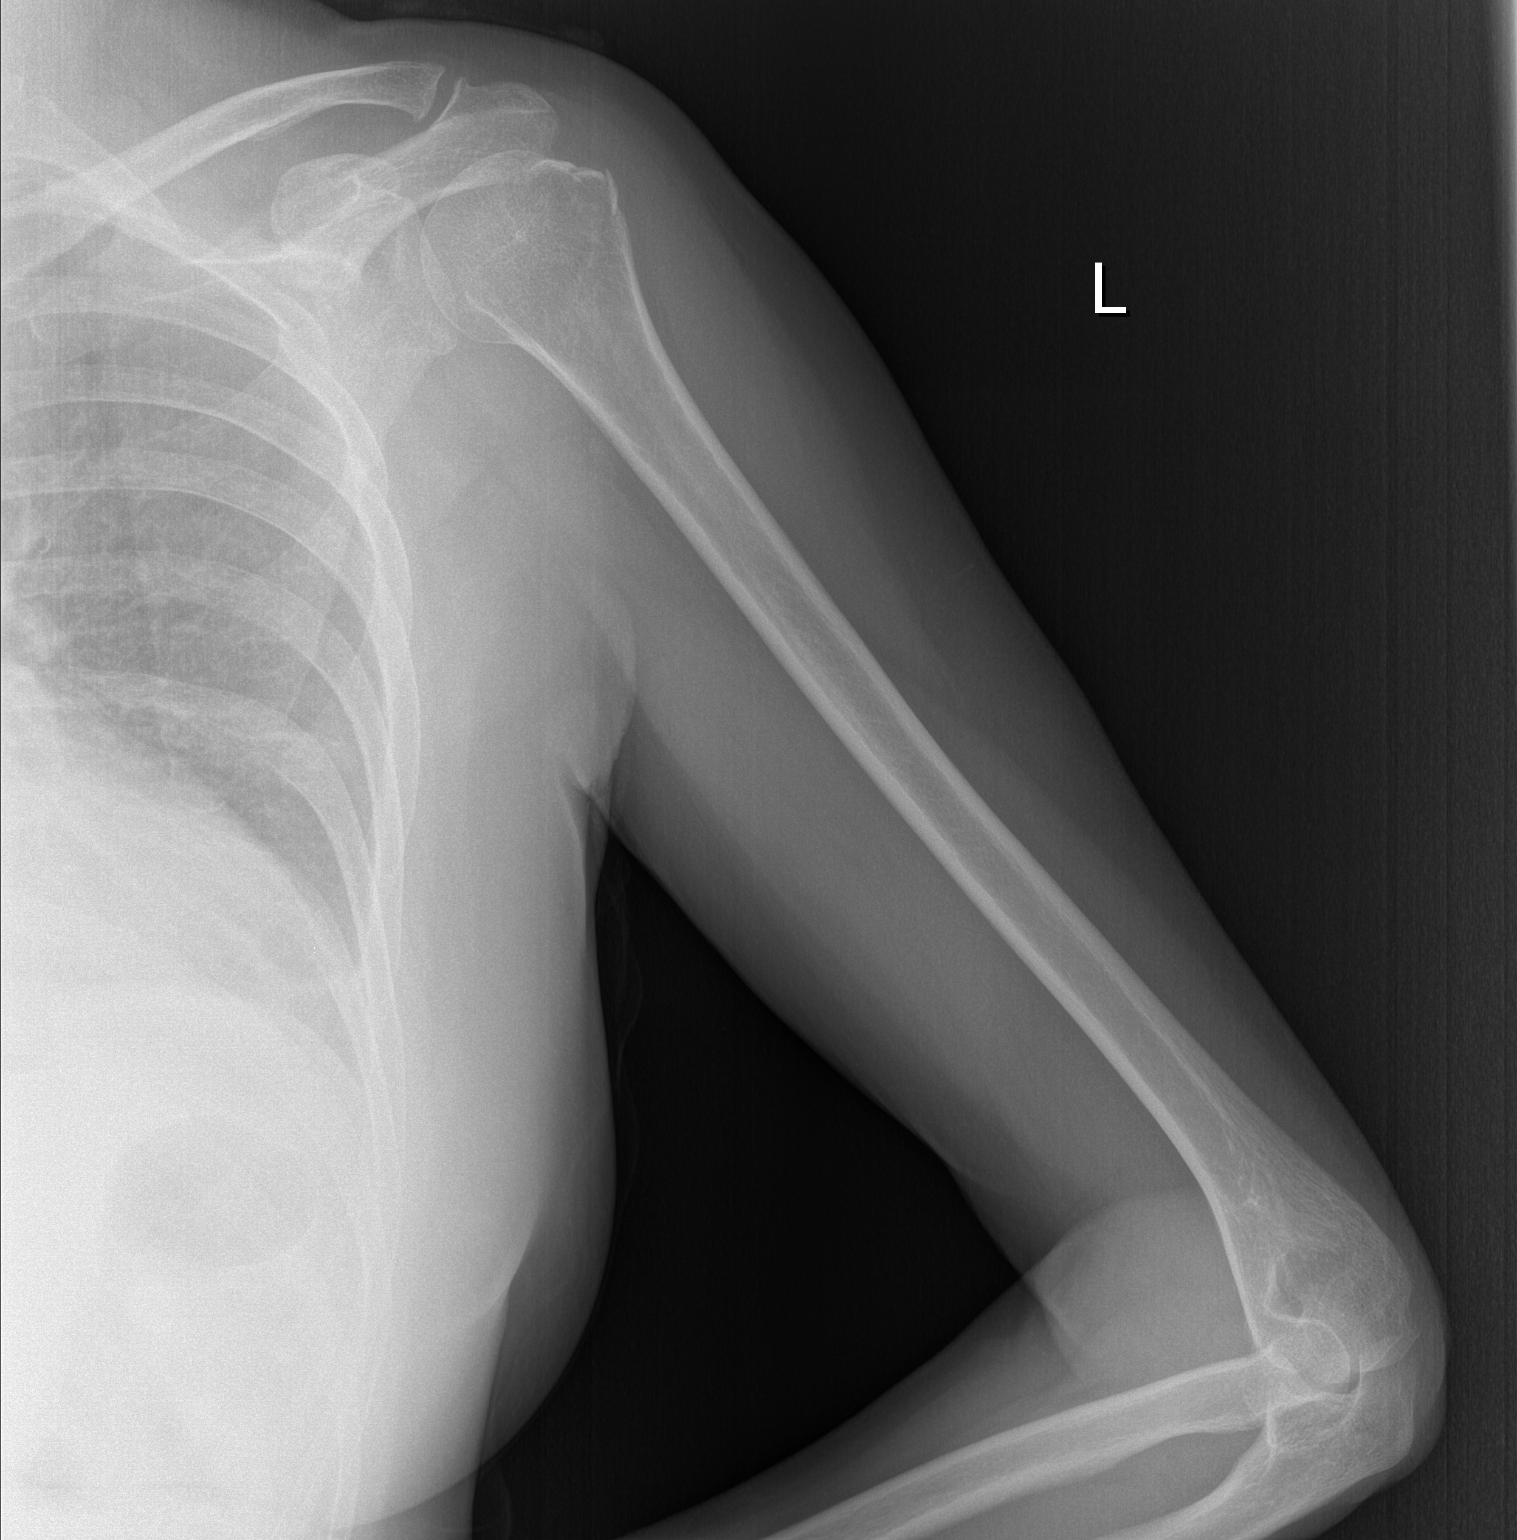

[2 of 2 positions shown; findings below may reference images not displayed]

FINDINGS: There is a fracture through the lateral humeral head/ neck in the
region of the greater tuberosity. No visible fracture crossing the
humeral neck. No subluxation or dislocation.
IMPRESSION: Fracture in the region of the greater tuberosity laterally. No
visible fracture across in the humeral neck.

## 2016-07-25 ENCOUNTER — Other Ambulatory Visit: Payer: Self-pay | Admitting: *Deleted

## 2016-07-25 MED ORDER — OXYCODONE HCL ER 10 MG PO T12A: EXTENDED_RELEASE_TABLET | ORAL | 0 refills | Status: DC

## 2016-07-25 NOTE — Telephone Encounter (Signed)
Patient requested and will pick up 

## 2016-07-26 ENCOUNTER — Emergency Department (HOSPITAL_BASED_OUTPATIENT_CLINIC_OR_DEPARTMENT_OTHER)
Admission: EM | Admit: 2016-07-26 | Discharge: 2016-07-26 | Disposition: A | Discharge status: Home / Self Care | Payer: PRIVATE HEALTH INSURANCE | Attending: Emergency Medicine | Admitting: Emergency Medicine

## 2016-07-26 ENCOUNTER — Encounter (HOSPITAL_BASED_OUTPATIENT_CLINIC_OR_DEPARTMENT_OTHER): Payer: Self-pay | Admitting: *Deleted

## 2016-07-26 DIAGNOSIS — T8140XA Infection following a procedure, unspecified, initial encounter: Secondary | ICD-10-CM

## 2016-07-26 DIAGNOSIS — I10 Essential (primary) hypertension: Secondary | ICD-10-CM | POA: Diagnosis not present

## 2016-07-26 DIAGNOSIS — Z79899 Other long term (current) drug therapy: Secondary | ICD-10-CM | POA: Diagnosis not present

## 2016-07-26 DIAGNOSIS — F17210 Nicotine dependence, cigarettes, uncomplicated: Secondary | ICD-10-CM | POA: Insufficient documentation

## 2016-07-26 DIAGNOSIS — Y658 Other specified misadventures during surgical and medical care: Secondary | ICD-10-CM | POA: Insufficient documentation

## 2016-07-26 DIAGNOSIS — T814XXA Infection following a procedure, initial encounter: Secondary | ICD-10-CM | POA: Insufficient documentation

## 2016-07-26 MED ORDER — AMOXICILLIN 500 MG PO CAPS: 500.0000 mg | ORAL_CAPSULE | Freq: Three times a day (TID) | ORAL | 0 refills | Status: DC

## 2016-07-26 MED ORDER — AMOXICILLIN 500 MG PO CAPS
1000.0000 mg | ORAL_CAPSULE | Freq: Once | ORAL | Status: AC
  Administered 2016-07-26: 1000 mg via ORAL

## 2016-07-26 NOTE — ED Provider Notes (Addendum)
[facility] Provider Note: Georgena Spurling, MD, FACEP  CSN: [id number] MRN: [id number] ARRIVAL: 07/26/16 at [place]: [place] Problem   HISTORY OF PRESENT ILLNESS  Theresa Carey is a 60 y.o. female who had 14 upper teeth pulled 3 weeks ago (07/11/16 per records) and had new dentures placed. This morning at work she noticed foul tasting, purulent appearing drainage from her extraction sockets primarily on the left side. She has removed her dentures and has been taking ibuprofen for the pain, which is moderate. She is also on OxyContin for chronic pain.   Past Medical History:  Diagnosis Date  . Abdominal pain 2000   chronic abdominal pain with nausea and vomiting  . Abnormal liver function   . Allergy   . Hypertension     Past Surgical History:  Procedure Laterality Date  . ABDOMINAL HYSTERECTOMY    . CHOLECYSTECTOMY      Family History  Problem Relation Age of Onset  . Hypertension Father   . Cancer - Other Father     Bladder  . Cancer Father   . Hypertension Mother   . Diabetes Maternal Grandmother   . Cancer - Other Maternal Grandmother     Pancreatic  . Cancer - Other Paternal Grandmother     Bone    Social History  Substance Use Topics  . Smoking status: Current Every Day Smoker    Packs/day: 1.00    Types: Cigarettes  . Smokeless tobacco: Never Used  . Alcohol use Yes     Comment: socially    Prior to Admission medications   Medication Sig Start Date End Date Taking? Authorizing Provider  amLODipine (NORVASC) 10 MG tablet take 1 tablet by mouth daily 05/19/16   Tiffany L Reed, DO  Cholecalciferol (VITAMIN D) 2000 UNITS CAPS Take 1 capsule (2,000 Units total) by mouth daily. 08/31/14   Tiffany L Reed, DO  lisinopril (PRINIVIL,ZESTRIL) 20 MG tablet take 1 tablet by mouth once daily for blood pressure 05/19/16   Tiffany L Reed, DO  LORazepam (ATIVAN) 0.5 MG tablet take 1 tablet by mouth every 6 hours if needed for  anxiety 07/03/16   Tiffany L Reed, DO  metoprolol tartrate (LOPRESSOR) 25 MG tablet Take 1 tablet (25 mg total) by mouth 2 (two) times daily. 03/27/16   Tiffany L Reed, DO  omeprazole (PRILOSEC) 20 MG capsule take 1 capsule by mouth once daily 05/19/16   Gayland Curry, DO  oxyCODONE (OXYCONTIN) 10 mg 12 hr tablet Take one tablet by mouth every 12 hours for pain 07/25/16   Tiffany L Reed, DO  simvastatin (ZOCOR) 20 MG tablet Take 1 tablet (20 mg total) by mouth at bedtime. 03/27/16   Tiffany L Reed, DO  zolpidem (AMBIEN) 10 MG tablet take 1 tablet by mouth at bedtime if needed for sleep 07/22/16   Lauree Chandler, NP    Allergies Avelox [moxifloxacin hcl in nacl]; Compazine [prochlorperazine edisylate]; Ibuprofen; Morphine and related; and Promethazine hcl   REVIEW OF SYSTEMS  Negative except as noted here or in the History of Present Illness.   PHYSICAL EXAMINATION  Initial Vital Signs Blood pressure 169/99, pulse 106, temperature 98.9 F (37.2 C), temperature source Oral, resp. rate 17, height 5\' 5"  (1.651 m), weight 145 lb (65.8 kg), SpO2 99 %.  Examination General: Well-developed, well-nourished female in no acute distress; appearance consistent with age of record HENT: normocephalic; multiple upper extraction sockets with purulent appearing drainage, primarily  on the left with soft tissue erythema and tenderness Eyes: pupils equal, round and reactive to light; extraocular muscles intact Neck: supple; no lymphadenopathy Heart: regular rate and rhythm Lungs: clear to auscultation bilaterally Abdomen: soft; nondistended Extremities: No deformity; full range of motion Neurologic: Awake, alert and oriented; motor function intact in all extremities and symmetric; no facial droop Skin: Warm and dry Psychiatric: Normal mood and affect   RESULTS  Summary of this visit's results, reviewed by myself:   EKG Interpretation  Date/Time:    Ventricular Rate:    PR Interval:    QRS  Duration:   QT Interval:    QTC Calculation:   R Axis:     Text Interpretation:        Laboratory Studies: No results found for this or any previous visit (from the past 24 hour(s)). Imaging Studies: No results found.  ED COURSE  Nursing notes and initial vitals signs, including pulse oximetry, reviewed.  Vitals:   07/26/16 0522 07/26/16 0523  BP: 169/99   Pulse: 106   Resp: 17   Temp: 98.9 F (37.2 C)   TempSrc: Oral   SpO2: 99%   Weight:  145 lb (65.8 kg)  Height:  5\' 5"  (1.651 m)   We will start on amoxicillin. She was advised to contact her dentist, Dr. Galvin Proffer, Monday.  PROCEDURES    ED DIAGNOSES     ICD-9-CM ICD-10-CM   1. Postoperative infection, initial encounter 998.59 T81.4XXA        Shanon Rosser, MD 07/26/16 [id number]    Shanon Rosser, MD 07/26/16 [phone]

## 2016-07-26 NOTE — ED Notes (Signed)
ED Provider at bedside. 

## 2016-07-26 NOTE — ED Triage Notes (Signed)
Pt reports she felt a drainage from her gums tonight. She took out her dentures (had 14 upper teeth remove and fitted for dentures 3 weeks ago) and noticed a purulent drainage from the left side of her mouth. Denies fevers. Has been taking ibuprofen for pain.

## 2016-07-28 ENCOUNTER — Other Ambulatory Visit: Payer: Self-pay | Admitting: Internal Medicine

## 2016-07-28 ENCOUNTER — Other Ambulatory Visit: Payer: PRIVATE HEALTH INSURANCE

## 2016-07-28 DIAGNOSIS — R739 Hyperglycemia, unspecified: Secondary | ICD-10-CM

## 2016-07-28 DIAGNOSIS — I10 Essential (primary) hypertension: Secondary | ICD-10-CM

## 2016-07-28 DIAGNOSIS — E876 Hypokalemia: Secondary | ICD-10-CM

## 2016-07-28 LAB — LIPID PANEL
Cholesterol: 178 mg/dL (ref <200)
HDL: 52 mg/dL (ref >50)
LDL Cholesterol: 103 mg/dL — ABNORMAL HIGH (ref <100)
Total CHOL/HDL Ratio: 3.4 Ratio (ref <5.0)
Triglycerides: 113 mg/dL (ref <150)
VLDL: 23 mg/dL (ref <30)

## 2016-07-28 LAB — CBC WITH DIFFERENTIAL/PLATELET
Basophils Absolute: 0 cells/uL (ref 0–200)
Basophils Relative: 0 %
Eosinophils Absolute: 188 cells/uL (ref 15–500)
Eosinophils Relative: 2 %
HCT: 40.4 % (ref 35.0–45.0)
Hemoglobin: 13 g/dL (ref 11.7–15.5)
Lymphocytes Relative: 41 %
Lymphs Abs: 3854 cells/uL (ref 850–3900)
MCH: 31.2 pg (ref 27.0–33.0)
MCHC: 32.2 g/dL (ref 32.0–36.0)
MCV: 96.9 fL (ref 80.0–100.0)
MPV: 9.9 fL (ref 7.5–12.5)
Monocytes Absolute: 658 cells/uL (ref 200–950)
Monocytes Relative: 7 %
Neutro Abs: 4700 cells/uL (ref 1500–7800)
Neutrophils Relative %: 50 %
Platelets: 411 10*3/uL — ABNORMAL HIGH (ref 140–400)
RBC: 4.17 MIL/uL (ref 3.80–5.10)
RDW: 14.1 % (ref 11.0–15.0)
WBC: 9.4 10*3/uL (ref 3.8–10.8)

## 2016-07-28 LAB — COMPLETE METABOLIC PANEL WITH GFR
ALT: 45 U/L — ABNORMAL HIGH (ref 6–29)
AST: 65 U/L — ABNORMAL HIGH (ref 10–35)
Albumin: 3.9 g/dL (ref 3.6–5.1)
Alkaline Phosphatase: 227 U/L — ABNORMAL HIGH (ref 33–130)
BUN: 14 mg/dL (ref 7–25)
CO2: 25 mmol/L (ref 20–31)
Calcium: 9.2 mg/dL (ref 8.6–10.4)
Chloride: 104 mmol/L (ref 98–110)
Creat: 0.63 mg/dL (ref 0.50–0.99)
GFR, Est African American: >89 mL/min (ref >=60)
GFR, Est Non African American: >89 mL/min (ref >=60)
Glucose, Bld: 105 mg/dL — ABNORMAL HIGH (ref 65–99)
Potassium: 4 mmol/L (ref 3.5–5.3)
Sodium: 139 mmol/L (ref 135–146)
Total Bilirubin: 0.3 mg/dL (ref 0.2–1.2)
Total Protein: 6.4 g/dL (ref 6.1–8.1)

## 2016-07-29 LAB — HEMOGLOBIN A1C
Hgb A1c MFr Bld: 5.3 % (ref <5.7)
Mean Plasma Glucose: 105 mg/dL

## 2016-07-31 ENCOUNTER — Ambulatory Visit (INDEPENDENT_AMBULATORY_CARE_PROVIDER_SITE_OTHER): Payer: PRIVATE HEALTH INSURANCE | Admitting: Internal Medicine

## 2016-07-31 ENCOUNTER — Encounter: Payer: Self-pay | Admitting: Internal Medicine

## 2016-07-31 VITALS — BP 120/70 | HR 90 | Temp 98.3°F | Wt 152.0 lb

## 2016-07-31 DIAGNOSIS — G8929 Other chronic pain: Secondary | ICD-10-CM | POA: Diagnosis not present

## 2016-07-31 DIAGNOSIS — I10 Essential (primary) hypertension: Secondary | ICD-10-CM

## 2016-07-31 DIAGNOSIS — R1011 Right upper quadrant pain: Secondary | ICD-10-CM

## 2016-07-31 DIAGNOSIS — R740 Nonspecific elevation of levels of transaminase and lactic acid dehydrogenase [LDH]: Secondary | ICD-10-CM | POA: Diagnosis not present

## 2016-07-31 DIAGNOSIS — R739 Hyperglycemia, unspecified: Secondary | ICD-10-CM

## 2016-07-31 DIAGNOSIS — K047 Periapical abscess without sinus: Secondary | ICD-10-CM | POA: Diagnosis not present

## 2016-07-31 DIAGNOSIS — R7401 Elevation of levels of liver transaminase levels: Secondary | ICD-10-CM

## 2016-07-31 DIAGNOSIS — F17200 Nicotine dependence, unspecified, uncomplicated: Secondary | ICD-10-CM

## 2016-07-31 NOTE — Progress Notes (Signed)
Location:  [facility] clinic Provider:  Ashli Selders L. Mariea Clonts, D.O., C.M.D.  Code Status: full code Goals of Care:  Advanced Directives 07/26/2016  Does Patient Have a Medical Advance Directive? No  Would patient like information on creating a medical advance directive? -     Chief Complaint  Patient presents with  . Medical Management of Chronic Issues    75mh follow-up    HPI: Patient is a 60y.o. female seen today for medical management of chronic diseases.    Pt just had dental work--11/17.  14 teeth extracted.  Then had dentures in right away. Did get hydrocodone from there for pain.    On Friday night, a bunch of pus came out of her mouth and it was draining down the left side of her mouth.  She feels air passing through her back tooth.  More of the pus came out again this am on the way here.  Has areas of exposed bone.  She can't eat a lot of the foods she likes.    She went back to the dentist Monday (for the 5th visit since the surgery) and he looked at the left side only.  He gave her flagyl at that time.  One every 6 hrs for a week.  She thinks she needs to go back to oral surgery.  She now had DDD of her thoracic spine with mild scoliosis on xray from ED 11/1.  Past Medical History:  Diagnosis Date  . Abdominal pain 2000   chronic abdominal pain with nausea and vomiting  . Abnormal liver function   . Allergy   . Hypertension     Past Surgical History:  Procedure Laterality Date  . ABDOMINAL HYSTERECTOMY    . CHOLECYSTECTOMY      Allergies  Allergen Reactions  . Avelox [Moxifloxacin Hcl In Nacl]     Low blood pressure  . Compazine [Prochlorperazine Edisylate] Other (See Comments)    Caused lockjaw  . Ibuprofen Hives  . Morphine And Related Nausea And Vomiting  . Promethazine Hcl Nausea And Vomiting      Medication List       Accurate as of 07/31/16  8:59 AM. Always use your most recent med list.          amLODipine 10 MG tablet Commonly known as:   NORVASC take 1 tablet by mouth daily   amoxicillin 500 MG capsule Commonly known as:  AMOXIL Take 1 capsule (500 mg total) by mouth 3 (three) times daily.   lisinopril 20 MG tablet Commonly known as:  PRINIVIL,ZESTRIL take 1 tablet by mouth once daily for blood pressure   LORazepam 0.5 MG tablet Commonly known as:  ATIVAN take 1 tablet by mouth every 6 hours if needed for anxiety   metoprolol tartrate 25 MG tablet Commonly known as:  LOPRESSOR Take 1 tablet (25 mg total) by mouth 2 (two) times daily.   metroNIDAZOLE 250 MG tablet Commonly known as:  FLAGYL take 1 tablet by mouth every 6 hours if needed until finished   omeprazole 20 MG capsule Commonly known as:  PRILOSEC take 1 capsule by mouth once daily   oxyCODONE 10 mg 12 hr tablet Commonly known as:  OXYCONTIN Take one tablet by mouth every 12 hours for pain   simvastatin 20 MG tablet Commonly known as:  ZOCOR Take 1 tablet (20 mg total) by mouth at bedtime.   Vitamin D 2000 units Caps Take 1 capsule (2,000 Units total) by mouth daily.  zolpidem 10 MG tablet Commonly known as:  AMBIEN take 1 tablet by mouth at bedtime if needed for sleep       Review of Systems:  Review of Systems  Constitutional: Negative for chills, fever and malaise/fatigue.  HENT: Positive for sinus pain. Negative for sore throat.        Mouth sore, drainage from left upper extraction area (from 3 wks ago)  Eyes: Negative for blurred vision.  Respiratory: Positive for cough. Negative for sputum production and shortness of breath.   Cardiovascular: Negative for chest pain and palpitations.  Gastrointestinal: Positive for abdominal pain and nausea. Negative for heartburn and vomiting.  Genitourinary: Negative for dysuria.  Musculoskeletal: Negative for falls.  Skin: Negative for itching and rash.  Neurological: Negative for dizziness, loss of consciousness and weakness.  Endo/Heme/Allergies: Does not bruise/bleed easily.    Psychiatric/Behavioral: Negative for depression and memory loss. The patient is nervous/anxious.     Health Maintenance  Topic Date Due  . ZOSTAVAX  09/19/2013  . COLONOSCOPY  08/25/2016  . MAMMOGRAM  01/16/2017  . PAP SMEAR  10/10/2018  . TETANUS/TDAP  09/27/2025  . INFLUENZA VACCINE  Completed  . Hepatitis C Screening  Completed  . HIV Screening  Completed    Physical Exam: Vitals:   07/31/16 0832  BP: 120/70  Pulse: 90  Temp: 98.3 F (36.8 C)  TempSrc: Oral  SpO2: 97%  Weight: 152 lb (68.9 kg)   Body mass index is 25.29 kg/m. Physical Exam  Constitutional: She is oriented to person, place, and time. She appears well-developed and well-nourished.  HENT:  Teeth have been extracted in mandible; area of bone visible on both sides; in the back on the left, there is a hole in the tissue, swelling remains inside, no drainage seen by me (but pt reports spitting it into a soda can in the car before coming in)  Cardiovascular: Normal rate, regular rhythm, normal heart sounds and intact distal pulses.   Pulmonary/Chest: Effort normal and breath sounds normal. No respiratory distress.  Abdominal: Soft. Bowel sounds are normal. She exhibits no distension. There is no tenderness.  Musculoskeletal: Normal range of motion.  Neurological: She is alert and oriented to person, place, and time.  Skin: Skin is warm and dry.  Psychiatric: She has a normal mood and affect.   Labs reviewed: Basic Metabolic Panel:  Recent Labs  09/28/15 1855 12/26/15 1525 07/28/16 0836  NA 140 139 139  K 3.0* 3.6 4.0  CL 101 108 104  CO2 24 19* 25  GLUCOSE 162* 158* 105*  BUN 19 21* 14  CREATININE 0.76 0.99 0.63  CALCIUM 9.4 9.5 9.2   Liver Function Tests:  Recent Labs  09/28/15 1855 12/26/15 1525 07/28/16 0836  AST 22 21 65*  ALT 16 16 45*  ALKPHOS 142* 143* 227*  BILITOT 1.1 1.1 0.3  PROT 7.5 8.2* 6.4  ALBUMIN 4.5 4.8 3.9   No results for input(s): LIPASE, AMYLASE in the last 8760  hours. No results for input(s): AMMONIA in the last 8760 hours. CBC:  Recent Labs  09/28/15 0945 09/28/15 1855 12/26/15 1525 07/28/16 0836  WBC 10.7 13.6* 8.1 9.4  NEUTROABS 7.2* 11.9*  --  4,700  HGB  --  13.3 15.6* 13.0  HCT 41.8 40.5 46.0 40.4  MCV 93 92.3 93.1 96.9  PLT 442* 383 349 411*   Lipid Panel:  Recent Labs  09/28/15 0945 07/28/16 0836  CHOL 223* 178  HDL 67 52  LDLCALC 113*  103*  TRIG 216* 113  CHOLHDL 3.3 3.4   Lab Results  Component Value Date   HGBA1C 5.3 07/28/2016    Assessment/Plan 1. Abscess, dental -complete course of flagyl -recommended she see oral surgery if she is not getting resolution from her regular dentist -if unable to see oral surgery soon or get seen again by dentist, come back here if infection persists for more abx  2. Tobacco use disorder -ongoing despite above though reports smoking less  3. Essential hypertension - bp well controlled with current regimen, cont same and monitor - Basic metabolic panel; Future  4. Elevated transaminase level - previously has had only alk phos elevated -denies any significant alcohol intake so suspect due to increased pain meds lately with hydrocodone/apap, oxy/apap and oxycontin -counseled on my concerns with APAP excess--not to exceed 3 g in 24 hr -f/u  Hepatic Function Panel; Future before next visit  5. Chronic RUQ pain -has spells of this -seems to resolve with nausea meds/gi cocktails as ED is no longer giving pain meds b/c she's on enough outpatient - Hepatic Function Panel; Future  6. Hyperglycemia - better this time, monitor, diet limited right now due to dental issues  - Hemoglobin A1c; Future - Basic metabolic panel; Future  Labs/tests ordered:   Orders Placed This Encounter  Procedures  . Hepatic Function Panel    Standing Status:   Future    Standing Expiration Date:   03/31/2017  . Hemoglobin A1c    Standing Status:   Future    Standing Expiration Date:   03/31/2017    . Basic metabolic panel    Standing Status:   Future    Standing Expiration Date:   03/31/2017    Next appt:  12/04/2016 med mgt, labs before  Waymond Meador L. Roseanna Koplin, D.O. [facility] [address], [place] [address] Cell Phone (Mon-Fri 8am-5pm):  [phone] On Call:  [phone] & follow prompts after 5pm & weekends Office Phone:  [phone] Office Fax:  [phone]

## 2016-08-20 ENCOUNTER — Other Ambulatory Visit: Payer: Self-pay | Admitting: Nurse Practitioner

## 2016-08-21 ENCOUNTER — Other Ambulatory Visit: Payer: Self-pay | Admitting: Nurse Practitioner

## 2016-08-27 ENCOUNTER — Other Ambulatory Visit: Payer: Self-pay | Admitting: *Deleted

## 2016-08-27 MED ORDER — OXYCODONE HCL ER 10 MG PO T12A: EXTENDED_RELEASE_TABLET | ORAL | 0 refills | Status: DC

## 2016-08-27 NOTE — Telephone Encounter (Signed)
Patient requested and will pick up 

## 2016-08-28 ENCOUNTER — Other Ambulatory Visit: Payer: Self-pay | Admitting: Internal Medicine

## 2016-08-28 DIAGNOSIS — F411 Generalized anxiety disorder: Secondary | ICD-10-CM

## 2016-09-22 ENCOUNTER — Other Ambulatory Visit: Payer: Self-pay | Admitting: Internal Medicine

## 2016-09-26 ENCOUNTER — Other Ambulatory Visit: Payer: Self-pay | Admitting: *Deleted

## 2016-09-26 MED ORDER — OXYCODONE HCL ER 10 MG PO T12A: EXTENDED_RELEASE_TABLET | ORAL | 0 refills | Status: DC

## 2016-09-26 NOTE — Telephone Encounter (Signed)
Patient requested and will pick up 

## 2016-10-21 ENCOUNTER — Other Ambulatory Visit: Payer: Self-pay | Admitting: Internal Medicine

## 2016-10-24 ENCOUNTER — Other Ambulatory Visit: Payer: Self-pay | Admitting: *Deleted

## 2016-10-24 ENCOUNTER — Telehealth: Payer: Self-pay

## 2016-10-24 MED ORDER — OXYCODONE HCL ER 10 MG PO T12A: EXTENDED_RELEASE_TABLET | ORAL | 0 refills | Status: DC

## 2016-10-24 NOTE — Telephone Encounter (Signed)
Left message to notify patient that prescription is ready for pick up.

## 2016-10-24 NOTE — Telephone Encounter (Signed)
Patient Requested and will pick up 

## 2016-11-05 ENCOUNTER — Other Ambulatory Visit: Payer: PRIVATE HEALTH INSURANCE

## 2016-11-05 DIAGNOSIS — R7401 Elevation of levels of liver transaminase levels: Secondary | ICD-10-CM

## 2016-11-05 DIAGNOSIS — I10 Essential (primary) hypertension: Secondary | ICD-10-CM

## 2016-11-05 DIAGNOSIS — R1011 Right upper quadrant pain: Secondary | ICD-10-CM

## 2016-11-05 DIAGNOSIS — R740 Nonspecific elevation of levels of transaminase and lactic acid dehydrogenase [LDH]: Principal | ICD-10-CM

## 2016-11-05 DIAGNOSIS — R739 Hyperglycemia, unspecified: Secondary | ICD-10-CM

## 2016-11-05 DIAGNOSIS — G8929 Other chronic pain: Secondary | ICD-10-CM

## 2016-11-05 LAB — HEPATIC FUNCTION PANEL
ALT: 37 U/L — ABNORMAL HIGH (ref 6–29)
AST: 17 U/L (ref 10–35)
Albumin: 4.1 g/dL (ref 3.6–5.1)
Alkaline Phosphatase: 145 U/L — ABNORMAL HIGH (ref 33–130)
Bilirubin, Direct: 0.1 mg/dL (ref <=0.2)
Indirect Bilirubin: 0.3 mg/dL (ref 0.2–1.2)
Total Bilirubin: 0.4 mg/dL (ref 0.2–1.2)
Total Protein: 6.7 g/dL (ref 6.1–8.1)

## 2016-11-05 LAB — BASIC METABOLIC PANEL
BUN: 11 mg/dL (ref 7–25)
CO2: 26 mmol/L (ref 20–31)
Calcium: 9.4 mg/dL (ref 8.6–10.4)
Chloride: 100 mmol/L (ref 98–110)
Creat: 0.72 mg/dL (ref 0.50–0.99)
Glucose, Bld: 100 mg/dL — ABNORMAL HIGH (ref 65–99)
Potassium: 3.8 mmol/L (ref 3.5–5.3)
Sodium: 137 mmol/L (ref 135–146)

## 2016-11-06 ENCOUNTER — Encounter: Payer: Self-pay | Admitting: *Deleted

## 2016-11-06 LAB — HEMOGLOBIN A1C
Hgb A1c MFr Bld: 5.2 % (ref <5.7)
Mean Plasma Glucose: 103 mg/dL

## 2016-11-07 ENCOUNTER — Ambulatory Visit (INDEPENDENT_AMBULATORY_CARE_PROVIDER_SITE_OTHER): Payer: PRIVATE HEALTH INSURANCE | Admitting: Internal Medicine

## 2016-11-07 ENCOUNTER — Encounter: Payer: Self-pay | Admitting: Internal Medicine

## 2016-11-07 VITALS — BP 110/70 | HR 75 | Temp 97.8°F | Wt 150.0 lb

## 2016-11-07 DIAGNOSIS — R740 Nonspecific elevation of levels of transaminase and lactic acid dehydrogenase [LDH]: Secondary | ICD-10-CM | POA: Diagnosis not present

## 2016-11-07 DIAGNOSIS — R1011 Right upper quadrant pain: Secondary | ICD-10-CM | POA: Diagnosis not present

## 2016-11-07 DIAGNOSIS — M545 Low back pain, unspecified: Secondary | ICD-10-CM

## 2016-11-07 DIAGNOSIS — F17200 Nicotine dependence, unspecified, uncomplicated: Secondary | ICD-10-CM | POA: Diagnosis not present

## 2016-11-07 DIAGNOSIS — G8929 Other chronic pain: Secondary | ICD-10-CM

## 2016-11-07 DIAGNOSIS — F411 Generalized anxiety disorder: Secondary | ICD-10-CM | POA: Diagnosis not present

## 2016-11-07 DIAGNOSIS — R739 Hyperglycemia, unspecified: Secondary | ICD-10-CM | POA: Diagnosis not present

## 2016-11-07 DIAGNOSIS — I10 Essential (primary) hypertension: Secondary | ICD-10-CM | POA: Diagnosis not present

## 2016-11-07 DIAGNOSIS — R7401 Elevation of levels of liver transaminase levels: Secondary | ICD-10-CM

## 2016-11-07 MED ORDER — LORAZEPAM 0.5 MG PO TABS: 0.5000 mg | ORAL_TABLET | Freq: Four times a day (QID) | ORAL | 0 refills | Status: DC | PRN

## 2016-11-07 NOTE — Progress Notes (Signed)
Location:  [facility] clinic Provider:  Antwine Agosto L. Mariea Clonts, D.O., C.M.D.  Code Status: full code Goals of Care:  Advanced Directives 11/07/2016  Does Patient Have a Medical Advance Directive? No  Would patient like information on creating a medical advance directive? -   Chief Complaint  Patient presents with  . Medical Management of Chronic Issues    64mth follow-up    HPI: Patient is a 61 y.o. female seen today for medical management of chronic diseases.    She is having swelling of her lower left side and some pain in her right lower back.  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  She has to sit due to pain.  No known injury.  Has not tried anything so far besides her regular medication oxycontin 10mg  q 12 hrs.  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  She's had a few weeks of this discomfort.  She stopped taking regular aleve.    Mentions one light colored clay stool and dark urine then, but normal since.  Liver labs improved since last visit.  She's not drinking anymore.  Weight down only 2 lbs.    She's got to travel up to [place] to visit her parents due to her father's cognition declining and mom having a fall.  BP normal today.  Has been home since her job relaxed and took her medication.  Still smoking but less than 1/2 ppd.  Not ready to quit.  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  She's still been eating them.   Past Medical History:  Diagnosis Date  . Abdominal pain 2000   chronic abdominal pain with nausea and vomiting  . Abnormal liver function   . Allergy   . Hypertension     Past Surgical History:  Procedure Laterality Date  . ABDOMINAL HYSTERECTOMY    . CHOLECYSTECTOMY      Allergies  Allergen Reactions  . Avelox [Moxifloxacin Hcl In Nacl]     Low blood pressure  . Compazine [Prochlorperazine Edisylate] Other (See Comments)    Caused lockjaw  . Ibuprofen Hives  .  Morphine And Related Nausea And Vomiting  . Promethazine Hcl Nausea And Vomiting    Allergies as of 11/07/2016      Reactions   Avelox [moxifloxacin Hcl In Nacl]    Low blood pressure   Compazine [prochlorperazine Edisylate] Other (See Comments)   Caused lockjaw   Ibuprofen Hives   Morphine And Related Nausea And Vomiting   Promethazine Hcl Nausea And Vomiting      Medication List       Accurate as of 11/07/16 10:11 AM. Always use your most recent med list.          amLODipine 10 MG tablet Commonly known as:  NORVASC take 1 tablet by mouth daily   lisinopril 20 MG tablet Commonly known as:  PRINIVIL,ZESTRIL take 1 tablet by mouth once daily for blood pressure   LORazepam 0.5 MG tablet Commonly known as:  ATIVAN Take 1 tablet (0.5 mg total) by mouth every 6 (six) hours as needed.   metoprolol tartrate 25 MG tablet Commonly known as:  LOPRESSOR Take 1 tablet (25 mg total) by mouth 2 (two) times daily.   omeprazole 20 MG capsule Commonly known as:  PRILOSEC take 1 capsule by mouth once daily   oxyCODONE 10 mg 12 hr tablet Commonly  known as:  OXYCONTIN Take one tablet by mouth every 12 hours for pain   simvastatin 20 MG tablet Commonly known as:  ZOCOR Take 1 tablet (20 mg total) by mouth at bedtime.   Vitamin D 2000 units Caps Take 1 capsule (2,000 Units total) by mouth daily.   zolpidem 10 MG tablet Commonly known as:  AMBIEN take 1 tablet by mouth at bedtime if needed for sleep       Review of Systems:  Review of Systems  Constitutional: Negative for chills and fever.  Respiratory: Negative for cough and shortness of breath.   Cardiovascular: Negative for chest pain, palpitations and leg swelling.  Gastrointestinal: Positive for abdominal pain. Negative for blood in stool, constipation, diarrhea, heartburn, melena, nausea and vomiting.  Genitourinary: Negative for dysuria.  Musculoskeletal: Positive for back pain. Negative for falls.  Neurological:  Negative for dizziness.  Psychiatric/Behavioral: Negative for memory loss. The patient is nervous/anxious.     Health Maintenance  Topic Date Due  . COLONOSCOPY  08/25/2016  . MAMMOGRAM  01/16/2017  . PAP SMEAR  10/10/2018  . TETANUS/TDAP  09/27/2025  . INFLUENZA VACCINE  Completed  . Hepatitis C Screening  Completed  . HIV Screening  Completed    Physical Exam: Vitals:   11/07/16 0955  BP: 110/70  Pulse: 75  Temp: 97.8 F (36.6 C)  TempSrc: Oral  SpO2: 98%  Weight: 150 lb (68 kg)   Body mass index is 24.96 kg/m. Physical Exam  Constitutional: She is oriented to person, place, and time. She appears well-developed and well-nourished. No distress.  Cardiovascular: Normal rate, regular rhythm, normal heart sounds and intact distal pulses.   Pulmonary/Chest: Effort normal and breath sounds normal. She has no wheezes.  Abdominal: Soft. Bowel sounds are normal. She exhibits no distension and no mass. There is no tenderness. There is no guarding.  Musculoskeletal: Normal range of motion. She exhibits tenderness.  Warmth over left flank/SI region, swelling over right posterior hip area  Neurological: She is alert and oriented to person, place, and time.  Skin: Skin is warm and dry. Capillary refill takes less than 2 seconds.  Psychiatric: She has a normal mood and affect.    Labs reviewed: Basic Metabolic Panel:  Recent Labs  12/26/15 1525 07/28/16 0836 11/05/16 0832  NA 139 139 137  K 3.6 4.0 3.8  CL 108 104 100  CO2 19* 25 26  GLUCOSE 158* 105* 100*  BUN 21* 14 11  CREATININE 0.99 0.63 0.72  CALCIUM 9.5 9.2 9.4   Liver Function Tests:  Recent Labs  12/26/15 1525 07/28/16 0836 11/05/16 0832  AST 21 65* 17  ALT 16 45* 37*  ALKPHOS 143* 227* 145*  BILITOT 1.1 0.3 0.4  PROT 8.2* 6.4 6.7  ALBUMIN 4.8 3.9 4.1   No results for input(s): LIPASE, AMYLASE in the last 8760 hours. No results for input(s): AMMONIA in the last 8760 hours. CBC:  Recent Labs   12/26/15 1525 07/28/16 0836  WBC 8.1 9.4  NEUTROABS  --  4,700  HGB 15.6* 13.0  HCT 46.0 40.4  MCV 93.1 96.9  PLT 349 411*   Lipid Panel:  Recent Labs  07/28/16 0836  CHOL 178  HDL 52  LDLCALC 103*  TRIG 113  CHOLHDL 3.4   Lab Results  Component Value Date   HGBA1C 5.2 11/05/2016   Assessment/Plan 1. 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 Anxiety state - LORazepam (ATIVAN) 0.5 MG tablet; Take 1 tablet (0.5 mg total) by mouth every 6 (six) hours as needed.  Dispense: 120 tablet; Refill: 0  3. Tobacco use disorder - cont to work on cutting down--does smoke less than a 1/2 ppd, but not ready to stop   4. Chronic RUQ pain - liver panel was improved with stopping alcohol since last visit, but still elevated -mentioned a single episode of clay-colored stool and dark urine, but this resolved by her next bm and urination--?stone in pancreatic duct (had chole) - f/u labs including pancreatic enzymes and liver panel - Hepatic Function Panel; Future  5. Essential hypertension - bp at goal today when she did not come directly from work - CBC with Differential/Platelet; Future  6. Elevated transaminase level - chronic, mixed etiology, some due to alcohol and some post-chole syndrome longstanding - Hepatic Function Panel; Future - Lipase; Future - Amylase; Future  7. Hyperglycemia -improved, but cont to monitor - Hemoglobin A1c; Future  Labs/tests ordered:  Orders Placed This Encounter  Procedures  . Hepatic Function Panel    Standing Status:   Future    Standing Expiration Date:   05/10/2017  . Lipase    Standing Status:   Future    Standing Expiration Date:   11/07/2017  . Hemoglobin A1c      Standing Status:   Future    Standing Expiration Date:   11/07/2017  . CBC with Differential/Platelet    Standing Status:   Future    Standing Expiration Date:   11/07/2017  . Amylase    Standing Status:   Future    Standing Expiration Date:   11/07/2017   Next appt:  02/12/2017  Refujio Haymer L. Cashae Weich, D.O. [facility] [address], [place] [address] Cell Phone (Mon-Fri 8am-5pm):  [phone] On Call:  [phone] & follow prompts after 5pm & weekends Office Phone:  [phone] Office Fax:  [phone]

## 2016-11-17 ENCOUNTER — Other Ambulatory Visit: Payer: Self-pay | Admitting: Internal Medicine

## 2016-11-20 ENCOUNTER — Telehealth: Payer: Self-pay

## 2016-11-20 NOTE — Telephone Encounter (Signed)
Patient called back to see if Dr. Mariea Clonts has responded to message.

## 2016-11-20 NOTE — Telephone Encounter (Signed)
Hydrocodone also has liver metabolism and most opioid pain meds do.  I'm not sure what else to suggest.

## 2016-11-20 NOTE — Telephone Encounter (Signed)
Patient would like to know if her Oxycodone could be changed to Vicodin (something without tylenol-liver concern)  Please advise

## 2016-11-24 ENCOUNTER — Telehealth: Payer: Self-pay

## 2016-11-24 ENCOUNTER — Other Ambulatory Visit: Payer: Self-pay | Admitting: Internal Medicine

## 2016-11-24 DIAGNOSIS — G8929 Other chronic pain: Secondary | ICD-10-CM

## 2016-11-24 DIAGNOSIS — R1011 Right upper quadrant pain: Principal | ICD-10-CM

## 2016-11-24 NOTE — Telephone Encounter (Signed)
Patient states that she will stay on current meds until she can come in to talk with Dr. Mariea Clonts.

## 2016-11-24 NOTE — Telephone Encounter (Signed)
Left message for patient to return call to office. 

## 2016-11-24 NOTE — Telephone Encounter (Signed)
Patient called and stated that she would like to try Oxycodone 10mg  every 6 hours. Please advise.

## 2016-11-25 NOTE — Telephone Encounter (Signed)
Ok to change when she is next due for her refill. Oxycodone 10mg  po q6h prn severe pain.

## 2016-11-25 NOTE — Telephone Encounter (Signed)
What was her reasoning? She is currently on long acting oxycodone 10mg  po q12 h.

## 2016-11-25 NOTE — Telephone Encounter (Signed)
Spoke with patient she stated because the medication she is on is to expensive. She is requesting to be on plain oxycodone. Please advise

## 2016-11-26 ENCOUNTER — Other Ambulatory Visit: Payer: Self-pay | Admitting: *Deleted

## 2016-11-26 MED ORDER — OXYCODONE HCL ER 10 MG PO T12A: EXTENDED_RELEASE_TABLET | ORAL | 0 refills | Status: DC

## 2016-11-26 NOTE — Telephone Encounter (Signed)
Patient called and will pick up

## 2016-11-27 ENCOUNTER — Other Ambulatory Visit: Payer: Self-pay | Admitting: Internal Medicine

## 2016-11-27 NOTE — Telephone Encounter (Signed)
Patient's medication list has been updated to reflect medication changes.

## 2016-12-02 ENCOUNTER — Other Ambulatory Visit: Payer: PRIVATE HEALTH INSURANCE

## 2016-12-04 ENCOUNTER — Ambulatory Visit: Payer: PRIVATE HEALTH INSURANCE | Admitting: Internal Medicine

## 2016-12-17 ENCOUNTER — Other Ambulatory Visit: Payer: Self-pay | Admitting: Nurse Practitioner

## 2016-12-19 ENCOUNTER — Other Ambulatory Visit: Payer: Self-pay | Admitting: *Deleted

## 2016-12-19 MED ORDER — ZOLPIDEM TARTRATE 10 MG PO TABS: ORAL_TABLET | ORAL | 0 refills | Status: DC

## 2016-12-19 NOTE — Telephone Encounter (Signed)
Patient requested to be sent to pharmacy.  

## 2016-12-25 ENCOUNTER — Other Ambulatory Visit: Payer: Self-pay

## 2016-12-25 ENCOUNTER — Telehealth: Payer: Self-pay

## 2016-12-25 MED ORDER — OXYCODONE HCL 10 MG PO TABS: 10.0000 mg | ORAL_TABLET | Freq: Four times a day (QID) | ORAL | 0 refills | Status: DC

## 2016-12-25 NOTE — Telephone Encounter (Signed)
Patient called requesting a refill of Oxycodone 10 mg tablet.  Printed prescription, waiting to be signed by provider.

## 2017-01-02 ENCOUNTER — Telehealth: Payer: Self-pay

## 2017-01-02 DIAGNOSIS — F411 Generalized anxiety disorder: Secondary | ICD-10-CM

## 2017-01-02 MED ORDER — LORAZEPAM 0.5 MG PO TABS: 0.5000 mg | ORAL_TABLET | Freq: Four times a day (QID) | ORAL | 0 refills | Status: DC | PRN

## 2017-01-02 NOTE — Telephone Encounter (Signed)
Jan with [facility] pharmacy called to request refill on Lorazepam

## 2017-01-15 ENCOUNTER — Other Ambulatory Visit: Payer: Self-pay | Admitting: Internal Medicine

## 2017-01-23 ENCOUNTER — Other Ambulatory Visit: Payer: Self-pay | Admitting: *Deleted

## 2017-01-23 MED ORDER — OXYCODONE HCL 10 MG PO TABS: 10.0000 mg | ORAL_TABLET | Freq: Four times a day (QID) | ORAL | 0 refills | Status: DC

## 2017-01-23 NOTE — Telephone Encounter (Signed)
Patient requested and will pick up 

## 2017-02-09 ENCOUNTER — Other Ambulatory Visit: Payer: PRIVATE HEALTH INSURANCE

## 2017-02-09 DIAGNOSIS — G8929 Other chronic pain: Secondary | ICD-10-CM

## 2017-02-09 DIAGNOSIS — I10 Essential (primary) hypertension: Secondary | ICD-10-CM

## 2017-02-09 DIAGNOSIS — R7401 Elevation of levels of liver transaminase levels: Secondary | ICD-10-CM

## 2017-02-09 DIAGNOSIS — R1011 Right upper quadrant pain: Principal | ICD-10-CM

## 2017-02-09 DIAGNOSIS — R740 Nonspecific elevation of levels of transaminase and lactic acid dehydrogenase [LDH]: Secondary | ICD-10-CM

## 2017-02-09 DIAGNOSIS — R739 Hyperglycemia, unspecified: Secondary | ICD-10-CM

## 2017-02-09 LAB — CBC WITH DIFFERENTIAL/PLATELET
Basophils Absolute: 0 cells/uL (ref 0–200)
Basophils Relative: 0 %
Eosinophils Absolute: 170 cells/uL (ref 15–500)
Eosinophils Relative: 2 %
HCT: 40.9 % (ref 35.0–45.0)
Hemoglobin: 13 g/dL (ref 11.7–15.5)
Lymphocytes Relative: 34 %
Lymphs Abs: 2890 cells/uL (ref 850–3900)
MCH: 30.2 pg (ref 27.0–33.0)
MCHC: 31.8 g/dL — ABNORMAL LOW (ref 32.0–36.0)
MCV: 94.9 fL (ref 80.0–100.0)
MPV: 10.7 fL (ref 7.5–12.5)
Monocytes Absolute: 595 cells/uL (ref 200–950)
Monocytes Relative: 7 %
Neutro Abs: 4845 cells/uL (ref 1500–7800)
Neutrophils Relative %: 57 %
Platelets: 367 10*3/uL (ref 140–400)
RBC: 4.31 MIL/uL (ref 3.80–5.10)
RDW: 14.2 % (ref 11.0–15.0)
WBC: 8.5 10*3/uL (ref 3.8–10.8)

## 2017-02-09 LAB — HEPATIC FUNCTION PANEL
ALT: 15 U/L (ref 6–29)
AST: 13 U/L (ref 10–35)
Albumin: 3.9 g/dL (ref 3.6–5.1)
Alkaline Phosphatase: 165 U/L — ABNORMAL HIGH (ref 33–130)
Bilirubin, Direct: 0.1 mg/dL (ref <=0.2)
Indirect Bilirubin: 0.2 mg/dL (ref 0.2–1.2)
Total Bilirubin: 0.3 mg/dL (ref 0.2–1.2)
Total Protein: 6.5 g/dL (ref 6.1–8.1)

## 2017-02-09 LAB — AMYLASE: Amylase: 23 U/L (ref 21–101)

## 2017-02-09 LAB — LIPASE: Lipase: 13 U/L (ref 7–60)

## 2017-02-10 LAB — HEMOGLOBIN A1C
Hgb A1c MFr Bld: 5.1 % (ref <5.7)
Mean Plasma Glucose: 100 mg/dL

## 2017-02-12 ENCOUNTER — Ambulatory Visit (INDEPENDENT_AMBULATORY_CARE_PROVIDER_SITE_OTHER): Payer: PRIVATE HEALTH INSURANCE | Admitting: Internal Medicine

## 2017-02-12 ENCOUNTER — Encounter: Payer: Self-pay | Admitting: Internal Medicine

## 2017-02-12 ENCOUNTER — Encounter: Payer: Self-pay | Admitting: *Deleted

## 2017-02-12 VITALS — BP 126/80 | HR 78 | Temp 98.1°F | Wt 150.0 lb

## 2017-02-12 DIAGNOSIS — I10 Essential (primary) hypertension: Secondary | ICD-10-CM | POA: Diagnosis not present

## 2017-02-12 DIAGNOSIS — F17200 Nicotine dependence, unspecified, uncomplicated: Secondary | ICD-10-CM

## 2017-02-12 DIAGNOSIS — R1011 Right upper quadrant pain: Secondary | ICD-10-CM | POA: Diagnosis not present

## 2017-02-12 DIAGNOSIS — Z636 Dependent relative needing care at home: Secondary | ICD-10-CM

## 2017-02-12 DIAGNOSIS — Z716 Tobacco abuse counseling: Secondary | ICD-10-CM

## 2017-02-12 DIAGNOSIS — G8929 Other chronic pain: Secondary | ICD-10-CM

## 2017-02-12 DIAGNOSIS — Z1211 Encounter for screening for malignant neoplasm of colon: Secondary | ICD-10-CM

## 2017-02-12 NOTE — Progress Notes (Signed)
Location:  [facility] clinic Provider:  Envy Meno L. Mariea Clonts, D.O., C.M.D.  Code Status: DNR Goals of Care:  Advanced Directives 11/07/2016  Does Patient Have a Medical Advance Directive? No  Would patient like information on creating a medical advance directive? -    Chief Complaint  Patient presents with  . Medical Management of Chronic Issues    25mth follow-up    HPI: Patient is a 60 y.o. female seen today for medical management of chronic diseases.    She's doing ok.  No new concerns.  Dog has diabetes.  Father with dementia declining and she has to go up to visit in [place].    Still smoking.  10 cigarettes/day typically.  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    BP at goal today.  On lisinopril, norvasc, lopressor.    She has changed pharmacies.    Reviewed labs with her.    Needs some screenings done, but insurance was not covering well.  Agrees to try cologuard.    Past Medical History:  Diagnosis Date  . Abdominal pain 2000   chronic abdominal pain with nausea and vomiting  . Abnormal liver function   . Allergy   . Hypertension     Past Surgical History:  Procedure Laterality Date  . ABDOMINAL HYSTERECTOMY    . CHOLECYSTECTOMY      Allergies  Allergen Reactions  . Avelox [Moxifloxacin Hcl In Nacl]     Low blood pressure  . Compazine [Prochlorperazine Edisylate] Other (See Comments)    Caused lockjaw  . Ibuprofen Hives  . Morphine And Related Nausea And Vomiting  . Promethazine Hcl Nausea And Vomiting    Allergies as of 02/12/2017      Reactions   Avelox [moxifloxacin Hcl In Nacl]    Low blood pressure   Compazine [prochlorperazine Edisylate] Other (See Comments)   Caused lockjaw   Ibuprofen Hives   Morphine And Related Nausea And Vomiting   Promethazine Hcl Nausea And Vomiting      Medication List       Accurate as of 02/12/17  8:28 AM. Always use your most recent med list.          amLODipine 10 MG tablet Commonly known as:   NORVASC take 1 tablet by mouth once daily   lisinopril 20 MG tablet Commonly known as:  PRINIVIL,ZESTRIL take 1 tablet by mouth once daily for blood pressure   LORazepam 0.5 MG tablet Commonly known as:  ATIVAN Take 1 tablet (0.5 mg total) by mouth every 6 (six) hours as needed.   metoprolol tartrate 25 MG tablet Commonly known as:  LOPRESSOR Take 1 tablet (25 mg total) by mouth 2 (two) times daily.   omeprazole 20 MG capsule Commonly known as:  PRILOSEC take 1 capsule by mouth once daily   Oxycodone HCl 10 MG Tabs Take 1 tablet (10 mg total) by mouth every 6 (six) hours.   simvastatin 20 MG tablet Commonly known as:  ZOCOR Take 1 tablet (20 mg total) by mouth at bedtime.   Vitamin D 2000 units Caps Take 1 capsule (2,000 Units total) by mouth daily.   zolpidem 10 MG tablet Commonly known as:  AMBIEN take 1 tablet by mouth at bedtime if needed for sleep       Review of Systems:  Review of Systems  Constitutional: Negative for chills and fever.  HENT: Negative for congestion and hearing loss.   Eyes: Negative for blurred vision.  Respiratory: Negative for shortness of breath.   Cardiovascular: Negative for chest pain, palpitations and leg swelling.  Gastrointestinal: Positive for abdominal pain and nausea. Negative for blood in stool, constipation, diarrhea, heartburn, melena and vomiting.  Genitourinary: Negative for dysuria.  Musculoskeletal: Negative for back pain, falls and myalgias.  Skin: Negative for itching and rash.  Neurological: Negative for dizziness and loss of consciousness.  Endo/Heme/Allergies: Does not bruise/bleed easily.  Psychiatric/Behavioral: Negative for depression and memory loss. The patient is not nervous/anxious and does not have insomnia.     Health Maintenance  Topic Date Due  . COLONOSCOPY  08/25/2016  . MAMMOGRAM  01/16/2017  . INFLUENZA VACCINE  03/25/2017  . PAP SMEAR  10/10/2018  . TETANUS/TDAP  09/27/2025  . Hepatitis C  Screening  Completed  . HIV Screening  Completed    Physical Exam: Vitals:   02/12/17 0811  BP: 126/80  Pulse: 78  Temp: 98.1 F (36.7 C)  TempSrc: Oral  SpO2: 98%  Weight: 150 lb (68 kg)   Body mass index is 24.96 kg/m. Physical Exam  Constitutional: She is oriented to person, place, and time. She appears well-developed and well-nourished. No distress.  Cardiovascular: Normal rate, regular rhythm, normal heart sounds and intact distal pulses.   Pulmonary/Chest: Effort normal and breath sounds normal. No respiratory distress.  Abdominal: Soft. Bowel sounds are normal. She exhibits no distension and no mass. There is tenderness. There is no rebound and no guarding. No hernia.  Musculoskeletal: Normal range of motion.  Neurological: She is alert and oriented to person, place, and time.  Skin: Skin is warm and dry.  Psychiatric: She has a normal mood and affect.    Labs reviewed: Basic Metabolic Panel:  Recent Labs  07/28/16 0836 11/05/16 0832  NA 139 137  K 4.0 3.8  CL 104 100  CO2 25 26  GLUCOSE 105* 100*  BUN 14 11  CREATININE 0.63 0.72  CALCIUM 9.2 9.4   Liver Function Tests:  Recent Labs  07/28/16 0836 11/05/16 0832 02/09/17 0826  AST 65* 17 13  ALT 45* 37* 15  ALKPHOS 227* 145* 165*  BILITOT 0.3 0.4 0.3  PROT 6.4 6.7 6.5  ALBUMIN 3.9 4.1 3.9    Recent Labs  02/09/17 0826  LIPASE 13  AMYLASE 23   No results for input(s): AMMONIA in the last 8760 hours. CBC:  Recent Labs  07/28/16 0836 02/09/17 0826  WBC 9.4 8.5  NEUTROABS 4,700 4,845  HGB 13.0 13.0  HCT 40.4 40.9  MCV 96.9 94.9  PLT 411* 367   Lipid Panel:  Recent Labs  07/28/16 0836  CHOL 178  HDL 52  LDLCALC 103*  TRIG 113  CHOLHDL 3.4   Lab Results  Component Value Date   HGBA1C 5.1 02/09/2017   Assessment/Plan 1. Essential hypertension -bp at goal, cont same regimen and monitor  2. Chronic RUQ pain - no recent flare-ups - cont chronic pain regimen--updated  contract due to change in pharmacy due to cost - Hepatic function panel; Future  3. Tobacco abuse counseling - ongoing, still smoking about 1/2ppd, unchanged, discussed again benefits of quitting  - CBC with Differential/Platelet; Future  4. Tobacco use disorder - ongoing as above - CBC with Differential/Platelet; Future  5. Caregiver stress Ongoing, dad is having more difficulty with behaviors   6. Colon cancer screening -would like to do cologuard, we discussed that once she gets info from [place] today, she can call the insurance to check on coverage/cost  Labs/tests ordered:   Orders Placed This Encounter  Procedures  . Hepatic function panel    Standing Status:   Future    Standing Expiration Date:   08/14/2017  . CBC with Differential/Platelet    Standing Status:   Future    Standing Expiration Date:   08/14/2017   Next appt:   3 mos med mgt  Shashana Fullington L. Jacee Enerson, D.O. [facility] [address], [place] [address] Cell Phone (Mon-Fri 8am-5pm):  [phone] On Call:  [phone] & follow prompts after 5pm & weekends Office Phone:  [phone] Office Fax:  [phone]

## 2017-02-16 ENCOUNTER — Other Ambulatory Visit: Payer: Self-pay | Admitting: *Deleted

## 2017-02-16 MED ORDER — ZOLPIDEM TARTRATE 10 MG PO TABS: ORAL_TABLET | ORAL | 0 refills | Status: DC

## 2017-02-16 NOTE — Telephone Encounter (Signed)
Patient requested to be faxed to [facility].

## 2017-02-19 ENCOUNTER — Telehealth: Payer: Self-pay | Admitting: *Deleted

## 2017-02-19 NOTE — Telephone Encounter (Signed)
Received notice that pt refused cologuard due to insurance issue. ( fax scanned in)

## 2017-02-23 ENCOUNTER — Other Ambulatory Visit: Payer: Self-pay

## 2017-02-23 MED ORDER — OXYCODONE HCL 10 MG PO TABS: 10.0000 mg | ORAL_TABLET | Freq: Four times a day (QID) | ORAL | 0 refills | Status: DC

## 2017-03-17 ENCOUNTER — Other Ambulatory Visit: Payer: Self-pay | Admitting: Nurse Practitioner

## 2017-03-25 ENCOUNTER — Other Ambulatory Visit: Payer: Self-pay | Admitting: *Deleted

## 2017-03-25 MED ORDER — OXYCODONE HCL 10 MG PO TABS: 10.0000 mg | ORAL_TABLET | Freq: Four times a day (QID) | ORAL | 0 refills | Status: DC

## 2017-03-25 MED ORDER — SIMVASTATIN 20 MG PO TABS: 20.0000 mg | ORAL_TABLET | Freq: Every day | ORAL | 3 refills | Status: DC

## 2017-03-25 NOTE — Telephone Encounter (Signed)
Patient requested and will pick up 

## 2017-03-25 NOTE — Telephone Encounter (Signed)
Patient requested refill to be sent to pharmacy.  

## 2017-03-30 ENCOUNTER — Other Ambulatory Visit: Payer: Self-pay

## 2017-03-30 DIAGNOSIS — F411 Generalized anxiety disorder: Secondary | ICD-10-CM

## 2017-03-30 MED ORDER — LORAZEPAM 0.5 MG PO TABS: 0.5000 mg | ORAL_TABLET | Freq: Four times a day (QID) | ORAL | 0 refills | Status: DC | PRN

## 2017-03-30 NOTE — Telephone Encounter (Signed)
Patient called, left message on voicemail. [facility]. Printed twice, first one was marked phone, second printed for Sherrie Mustache, NP to sign. Called patient left message on voicemail that we received her message, printed Rx will fax it to [facility].

## 2017-04-16 ENCOUNTER — Other Ambulatory Visit: Payer: Self-pay | Admitting: Nurse Practitioner

## 2017-04-16 NOTE — Telephone Encounter (Signed)
rx called into pharmacy

## 2017-04-24 ENCOUNTER — Other Ambulatory Visit: Payer: Self-pay | Admitting: *Deleted

## 2017-04-24 MED ORDER — OXYCODONE HCL 10 MG PO TABS: 10.0000 mg | ORAL_TABLET | Freq: Four times a day (QID) | ORAL | 0 refills | Status: DC

## 2017-04-24 NOTE — Telephone Encounter (Signed)
Patient requested and will pick up [place]CCS[place] Database Checked.  

## 2017-05-15 ENCOUNTER — Other Ambulatory Visit: Payer: Self-pay | Admitting: Internal Medicine

## 2017-05-20 ENCOUNTER — Telehealth: Payer: Self-pay | Admitting: *Deleted

## 2017-05-20 MED ORDER — OXYCODONE HCL 10 MG PO TABS: 10.0000 mg | ORAL_TABLET | Freq: Four times a day (QID) | ORAL | 0 refills | Status: DC

## 2017-05-20 NOTE — Telephone Encounter (Signed)
Patient called and stated that she is going to [place] tomorrow morning and won't be back in town until 05/29/17. Patient is wanting to know if she can pick up her pain medication Rx today since she is going to be out of town. Last Refilled 04/24/17. Please Advise.

## 2017-05-20 NOTE — Telephone Encounter (Signed)
Printed and placed for signature.

## 2017-05-20 NOTE — Telephone Encounter (Signed)
Ok this one time if Dr. Eulas Post is willing to sign it.

## 2017-06-04 ENCOUNTER — Other Ambulatory Visit: Payer: Self-pay | Admitting: Nurse Practitioner

## 2017-06-04 DIAGNOSIS — F411 Generalized anxiety disorder: Secondary | ICD-10-CM

## 2017-06-04 NOTE — Telephone Encounter (Signed)
A medication refill request was received for lorazepam 0.5 mg tablets. Rx called in to pharmacy for #120 with no RF.   [place] data base was check before this medication was filled.

## 2017-06-05 ENCOUNTER — Other Ambulatory Visit: Payer: Self-pay | Admitting: Nurse Practitioner

## 2017-06-05 DIAGNOSIS — F411 Generalized anxiety disorder: Secondary | ICD-10-CM

## 2017-06-15 ENCOUNTER — Other Ambulatory Visit: Payer: PRIVATE HEALTH INSURANCE

## 2017-06-18 ENCOUNTER — Ambulatory Visit: Payer: PRIVATE HEALTH INSURANCE | Admitting: Internal Medicine

## 2017-07-01 ENCOUNTER — Other Ambulatory Visit: Payer: Self-pay | Admitting: Internal Medicine

## 2017-07-01 DIAGNOSIS — G8929 Other chronic pain: Secondary | ICD-10-CM

## 2017-07-01 DIAGNOSIS — R1011 Right upper quadrant pain: Principal | ICD-10-CM

## 2017-07-01 MED ORDER — OXYCODONE HCL 10 MG PO TABS: 10.0000 mg | ORAL_TABLET | Freq: Four times a day (QID) | ORAL | 0 refills | Status: DC

## 2017-07-01 NOTE — Telephone Encounter (Signed)
Patient requested and will pick up [place] Verified Patient aware she needs to schedule appointment.

## 2017-07-03 ENCOUNTER — Telehealth: Payer: Self-pay | Admitting: Internal Medicine

## 2017-07-03 NOTE — Telephone Encounter (Signed)
Patient came to pick up her prescription this morning and she asked me to let you know that she is currently on a leave of absence and is in [place] because her mother fell and broke her arm and back and she has been there taking care of her. She said she was here now for the weekend but she is not sure when she will be back as she has been taking care of both of her parents while she has been in [place]. Informed patient I would let you know

## 2017-07-04 ENCOUNTER — Other Ambulatory Visit: Payer: Self-pay | Admitting: Internal Medicine

## 2017-07-05 ENCOUNTER — Other Ambulatory Visit: Payer: Self-pay | Admitting: Internal Medicine

## 2017-07-05 NOTE — Telephone Encounter (Signed)
Noted.  I know her parents have been ill.

## 2017-07-06 ENCOUNTER — Other Ambulatory Visit: Payer: Self-pay | Admitting: Internal Medicine

## 2017-07-06 ENCOUNTER — Other Ambulatory Visit: Payer: Self-pay | Admitting: *Deleted

## 2017-07-06 MED ORDER — LISINOPRIL 20 MG PO TABS: 20.0000 mg | ORAL_TABLET | Freq: Every day | ORAL | 0 refills | Status: DC

## 2017-07-06 NOTE — Telephone Encounter (Signed)
[facility] 

## 2017-08-03 ENCOUNTER — Ambulatory Visit: Payer: PRIVATE HEALTH INSURANCE | Admitting: Internal Medicine

## 2017-08-06 ENCOUNTER — Telehealth: Payer: Self-pay | Admitting: *Deleted

## 2017-08-06 DIAGNOSIS — F411 Generalized anxiety disorder: Secondary | ICD-10-CM

## 2017-08-06 MED ORDER — AMLODIPINE BESYLATE 10 MG PO TABS: 10.0000 mg | ORAL_TABLET | Freq: Every day | ORAL | 0 refills | Status: DC

## 2017-08-06 NOTE — Telephone Encounter (Signed)
Patient called and stated that she is still in [place] with her parents. They are not doing well. Needs Rx faxed up there for her medications [facility] [phone]. Pharmacy updated. Has appointment on 08/20/17 confirmed appointment with patient.  Rx Pended NCCSRS Database Verified.

## 2017-08-07 MED ORDER — ZOLPIDEM TARTRATE 10 MG PO TABS: 10.0000 mg | ORAL_TABLET | Freq: Every day | ORAL | 0 refills | Status: DC

## 2017-08-07 MED ORDER — LORAZEPAM 0.5 MG PO TABS: 0.5000 mg | ORAL_TABLET | Freq: Four times a day (QID) | ORAL | 0 refills | Status: DC | PRN

## 2017-08-07 MED ORDER — OXYCODONE HCL 10 MG PO TABS: 10.0000 mg | ORAL_TABLET | Freq: Four times a day (QID) | ORAL | 0 refills | Status: DC

## 2017-08-07 NOTE — Telephone Encounter (Signed)
Patient called and stated that her Rx's were not sent to the pharmacy and she is just calling to check if they have been sent. Please Advise. Rx's are Pending.

## 2017-08-07 NOTE — Telephone Encounter (Signed)
I've signed them now.  They looked different and I was trying to determine how to sign them.

## 2017-08-20 ENCOUNTER — Ambulatory Visit (INDEPENDENT_AMBULATORY_CARE_PROVIDER_SITE_OTHER): Payer: PRIVATE HEALTH INSURANCE | Admitting: Internal Medicine

## 2017-08-20 ENCOUNTER — Encounter: Payer: Self-pay | Admitting: Internal Medicine

## 2017-08-20 VITALS — BP 150/90 | HR 82 | Temp 98.2°F | Wt 134.0 lb

## 2017-08-20 DIAGNOSIS — M67432 Ganglion, left wrist: Secondary | ICD-10-CM | POA: Diagnosis not present

## 2017-08-20 DIAGNOSIS — I10 Essential (primary) hypertension: Secondary | ICD-10-CM | POA: Diagnosis not present

## 2017-08-20 DIAGNOSIS — F17209 Nicotine dependence, unspecified, with unspecified nicotine-induced disorders: Secondary | ICD-10-CM

## 2017-08-20 DIAGNOSIS — F439 Reaction to severe stress, unspecified: Secondary | ICD-10-CM | POA: Diagnosis not present

## 2017-08-20 DIAGNOSIS — R1011 Right upper quadrant pain: Secondary | ICD-10-CM | POA: Diagnosis not present

## 2017-08-20 DIAGNOSIS — Z716 Tobacco abuse counseling: Secondary | ICD-10-CM | POA: Diagnosis not present

## 2017-08-20 DIAGNOSIS — D367 Benign neoplasm of other specified sites: Secondary | ICD-10-CM | POA: Diagnosis not present

## 2017-08-20 DIAGNOSIS — G8929 Other chronic pain: Secondary | ICD-10-CM

## 2017-08-20 LAB — CBC WITH DIFFERENTIAL/PLATELET
Basophils Absolute: 73 cells/uL (ref 0–200)
Basophils Relative: 0.9 %
Eosinophils Absolute: 113 cells/uL (ref 15–500)
Eosinophils Relative: 1.4 %
HCT: 38.1 % (ref 35.0–45.0)
Hemoglobin: 13.1 g/dL (ref 11.7–15.5)
Lymphs Abs: 2908 cells/uL (ref 850–3900)
MCH: 31.7 pg (ref 27.0–33.0)
MCHC: 34.4 g/dL (ref 32.0–36.0)
MCV: 92.3 fL (ref 80.0–100.0)
MPV: 10.5 fL (ref 7.5–12.5)
Monocytes Relative: 7.5 %
Neutro Abs: 4398 cells/uL (ref 1500–7800)
Neutrophils Relative %: 54.3 %
Platelets: 392 10*3/uL (ref 140–400)
RBC: 4.13 10*6/uL (ref 3.80–5.10)
RDW: 14.3 % (ref 11.0–15.0)
Total Lymphocyte: 35.9 %
WBC mixed population: 608 cells/uL (ref 200–950)
WBC: 8.1 10*3/uL (ref 3.8–10.8)

## 2017-08-20 LAB — HEPATIC FUNCTION PANEL
AG Ratio: 1.9 (calc) (ref 1.0–2.5)
ALT: 33 U/L — ABNORMAL HIGH (ref 6–29)
AST: 47 U/L — ABNORMAL HIGH (ref 10–35)
Albumin: 4.1 g/dL (ref 3.6–5.1)
Alkaline phosphatase (APISO): 256 U/L — ABNORMAL HIGH (ref 33–130)
Bilirubin, Direct: 0.3 mg/dL — ABNORMAL HIGH (ref 0.0–0.2)
Globulin: 2.2 g/dL (calc) (ref 1.9–3.7)
Indirect Bilirubin: 0.4 mg/dL (calc) (ref 0.2–1.2)
Total Bilirubin: 0.7 mg/dL (ref 0.2–1.2)
Total Protein: 6.3 g/dL (ref 6.1–8.1)

## 2017-08-20 MED ORDER — ESCITALOPRAM OXALATE 10 MG PO TABS: 10.0000 mg | ORAL_TABLET | Freq: Every day | ORAL | 3 refills | Status: DC

## 2017-08-20 NOTE — Progress Notes (Signed)
Location:  [facility] clinic Provider:  Margot Oriordan L. Mariea Clonts, D.O., C.M.D.  Code Status: full code Goals of Care:  Advanced Directives 11/07/2016  Does Patient Have a Medical Advance Directive? No  Would patient like information on creating a medical advance directive? -   Chief Complaint  Patient presents with  . Medical Management of Chronic Issues    30mth follow-up    HPI: Patient is a 61 y.o. female seen today for medical management of chronic diseases.  She missed her last appt due to snow.  She's been up in [place] taking care of her parents after her mom fell.    Has lost a lot of weight and bp up.  Down 16 lbs.  Has a lot of stress.    His mother is very demanding--she fell and broke her back and wrist.  She had to leave her job and is collecting early security.  Father is 54 and can mostly do for himself.  Sister and niece are helping too. There are fewer appts now.     Has been to the ED twice with abdominal pain.    Can't smoke in the house.  Her father smokes though.  Mother counts how much she smokes.  Has also drank "a little bit" and says her liver numbers will be high.    Cyst on left upper arm is painful.  Also has ganglion cyst on wrist on left.  Past Medical History:  Diagnosis Date  . Abdominal pain 2000   chronic abdominal pain with nausea and vomiting  . Abnormal liver function   . Allergy   . Hypertension     Past Surgical History:  Procedure Laterality Date  . ABDOMINAL HYSTERECTOMY    . CHOLECYSTECTOMY      Allergies  Allergen Reactions  . Avelox [Moxifloxacin Hcl In Nacl]     Low blood pressure  . Compazine [Prochlorperazine Edisylate] Other (See Comments)    Caused lockjaw  . Ibuprofen Hives  . Morphine And Related Nausea And Vomiting  . Promethazine Hcl Nausea And Vomiting    Outpatient Encounter Medications as of 08/20/2017  Medication Sig  . amLODipine (NORVASC) 10 MG tablet Take 1 tablet (10 mg total) by mouth daily.  . Cholecalciferol (VITAMIN  D) 2000 UNITS CAPS Take 1 capsule (2,000 Units total) by mouth daily.  Marland Kitchen lisinopril (PRINIVIL,ZESTRIL) 20 MG tablet Take 1 tablet (20 mg total) daily by mouth.  Marland Kitchen LORazepam (ATIVAN) 0.5 MG tablet Take 1 tablet (0.5 mg total) by mouth every 6 (six) hours as needed.  . metoprolol tartrate (LOPRESSOR) 25 MG tablet Take 1 tablet (25 mg total) by mouth 2 (two) times daily.  Marland Kitchen omeprazole (PRILOSEC) 20 MG capsule TAKE 1 CAPSULE BY MOUTH ONCE DAILY  . Oxycodone HCl 10 MG TABS Take 1 tablet (10 mg total) by mouth every 6 (six) hours.  . simvastatin (ZOCOR) 20 MG tablet Take 1 tablet (20 mg total) by mouth at bedtime.  Marland Kitchen zolpidem (AMBIEN) 10 MG tablet Take 1 tablet (10 mg total) by mouth at bedtime.   No facility-administered encounter medications on file as of 08/20/2017.     Review of Systems:  Review of Systems  Constitutional: Positive for weight loss. Negative for chills, fever and malaise/fatigue.  HENT: Negative for congestion.   Eyes: Negative for blurred vision.  Respiratory: Positive for cough. Negative for shortness of breath.        Continued tobacco abuse  Cardiovascular: Negative for chest pain, palpitations and leg swelling.  Gastrointestinal: Positive for abdominal pain, nausea and vomiting. Negative for blood in stool, constipation, diarrhea, heartburn and melena.  Genitourinary: Negative for dysuria.  Musculoskeletal: Negative for falls.  Neurological: Negative for dizziness and weakness.  Psychiatric/Behavioral: Positive for depression. Negative for memory loss. The patient is nervous/anxious.        Drinking alcohol and smoking    Health Maintenance  Topic Date Due  . COLONOSCOPY  08/25/2016  . MAMMOGRAM  01/16/2017  . PAP SMEAR  10/10/2018  . TETANUS/TDAP  09/27/2025  . INFLUENZA VACCINE  Completed  . Hepatitis C Screening  Completed  . HIV Screening  Completed    Physical Exam: Vitals:   08/20/17 1516  BP: (!) 150/90  Pulse: 82  Temp: 98.2 F (36.8 C)    TempSrc: Oral  SpO2: 98%  Weight: 134 lb (60.8 kg)   Body mass index is 22.3 kg/m. Physical Exam  Constitutional: She is oriented to person, place, and time. No distress.  Cardiovascular: Normal rate, regular rhythm, normal heart sounds and intact distal pulses.  Pulmonary/Chest: Effort normal and breath sounds normal. No respiratory distress.  Abdominal: Soft. Bowel sounds are normal. She exhibits no distension. There is no tenderness.  Musculoskeletal: Normal range of motion.  Neurological: She is alert and oriented to person, place, and time.  Skin: Skin is warm and dry.  Left upper arm with palpable subcutaneous cystic nodule; another ganglion cyst over tendon of left wrist  Psychiatric:  Anxious and pressured speech    Labs reviewed: Basic Metabolic Panel: Recent Labs    11/05/16 0832  NA 137  K 3.8  CL 100  CO2 26  GLUCOSE 100*  BUN 11  CREATININE 0.72  CALCIUM 9.4   Liver Function Tests: Recent Labs    11/05/16 0832 02/09/17 0826  AST 17 13  ALT 37* 15  ALKPHOS 145* 165*  BILITOT 0.4 0.3  PROT 6.7 6.5  ALBUMIN 4.1 3.9   Recent Labs    02/09/17 0826  LIPASE 13  AMYLASE 23   No results for input(s): AMMONIA in the last 8760 hours. CBC: Recent Labs    02/09/17 0826  WBC 8.5  NEUTROABS 4,845  HGB 13.0  HCT 40.9  MCV 94.9  PLT 367   Lipid Panel: No results for input(s): CHOL, HDL, LDLCALC, TRIG, CHOLHDL, LDLDIRECT in the last 8760 hours. Lab Results  Component Value Date   HGBA1C 5.1 02/09/2017    Assessment/Plan 1. Stress - ongoing from being primary caregiver for parents, apparently mother is very demanding - escitalopram (LEXAPRO) 10 MG tablet; Take 1 tablet (10 mg total) by mouth daily.  Dispense: 90 tablet; Refill: 3  2. Chronic RUQ pain - is now drinking more alcohol so likely worse--counseled on this - Hepatic function panel  3. Tobacco use disorder - ongoing, not ready to quit with current stress levels - CBC with  Differential/Platelet  4. Essential hypertension -bp elevated today, extra anxious and has been smoking - no change as due to stress and smoking just before visit  5. Ganglion cyst of wrist, left -will ask for referral if she decides to have it removed  6. Cyst, dermoid, arm, left -painful at times, will ask for referral if she wants it removed  7. Tobacco abuse counseling -counseled for at least 3 mins on smoking cessation but pt declined - CBC with Differential/Platelet  Labs/tests ordered:  No orders of the defined types were placed in this encounter. labs done that were already ordered for before  visit  Next appt:  12/21/2017 med mgt  Sinead Hockman L. Anistyn Graddy, D.O. [facility] [address], [place] [address] Cell Phone (Mon-Fri 8am-5pm):  [phone] On Call:  [phone] & follow prompts after 5pm & weekends Office Phone:  [phone] Office Fax:  [phone]

## 2017-08-24 ENCOUNTER — Encounter: Payer: Self-pay | Admitting: *Deleted

## 2017-09-04 ENCOUNTER — Other Ambulatory Visit: Payer: Self-pay | Admitting: *Deleted

## 2017-09-04 DIAGNOSIS — F411 Generalized anxiety disorder: Secondary | ICD-10-CM

## 2017-09-04 MED ORDER — ZOLPIDEM TARTRATE 10 MG PO TABS: 10.0000 mg | ORAL_TABLET | Freq: Every day | ORAL | 0 refills | Status: DC

## 2017-09-04 MED ORDER — LORAZEPAM 0.5 MG PO TABS: 0.5000 mg | ORAL_TABLET | Freq: Four times a day (QID) | ORAL | 0 refills | Status: DC | PRN

## 2017-09-04 MED ORDER — OXYCODONE HCL 10 MG PO TABS: 10.0000 mg | ORAL_TABLET | Freq: Four times a day (QID) | ORAL | 0 refills | Status: DC

## 2017-09-04 NOTE — Telephone Encounter (Signed)
Patient requested refill. Patient still in [place] taking care of parents.  [place] Confirmed Pended Rx and sent to Dr. Mariea Clonts for approval.

## 2017-09-30 ENCOUNTER — Telehealth: Payer: Self-pay | Admitting: *Deleted

## 2017-09-30 NOTE — Telephone Encounter (Signed)
Patient notified and agreed. Yes in note Theresa Carey stated that her dad had a diagnosis of Flu/Pneumonia. His Dr. Had given his wife [name], did not give to [name] because she was not a patient.  Daughter also wanted to let you know she has lost 27 pounds thus far.

## 2017-09-30 NOTE — Telephone Encounter (Signed)
Did anyone in the family actually have a flu diagnosis?  Not much to do about fatigue over a month later.  She can use robitussin expectorant in the day and suppressant at night for cough.

## 2017-09-30 NOTE — Telephone Encounter (Signed)
Patient called and stated that her whole family has been sick. Stated that her dad was diagnosed with Flu/Pneumonia. Patient started running fever on 12/28 but no fever now but is feeling fatigue. Has cough, non productive with some wheezing. Patient is wanting to know what she can take to help the fatigue and cough. Please Advise.

## 2017-10-01 NOTE — Telephone Encounter (Signed)
She needs to stop drinking and smoking which are contributing to her weight loss b/c she was not eating.  If she started with symptoms way back on 12/28, tamiflu will not help her this far out.

## 2017-10-07 ENCOUNTER — Other Ambulatory Visit: Payer: Self-pay | Admitting: *Deleted

## 2017-10-07 MED ORDER — OXYCODONE HCL 10 MG PO TABS: 10.0000 mg | ORAL_TABLET | Freq: Four times a day (QID) | ORAL | 0 refills | Status: DC

## 2017-10-07 MED ORDER — ZOLPIDEM TARTRATE 10 MG PO TABS: 10.0000 mg | ORAL_TABLET | Freq: Every day | ORAL | 0 refills | Status: DC

## 2017-10-07 NOTE — Telephone Encounter (Signed)
Patient called and requested refill to be sent to [place] [facility]. Still caring for her parents.  [place] Confirmed Pended Rx and sent to Dr. Mariea Clonts to review and approve.

## 2017-10-27 ENCOUNTER — Other Ambulatory Visit: Payer: Self-pay | Admitting: *Deleted

## 2017-10-27 DIAGNOSIS — F411 Generalized anxiety disorder: Secondary | ICD-10-CM

## 2017-10-27 MED ORDER — LORAZEPAM 0.5 MG PO TABS
0.5000 mg | ORAL_TABLET | Freq: Four times a day (QID) | ORAL | 0 refills | Status: DC | PRN
Start: 2017-10-27 — End: 2017-12-21

## 2017-10-27 NOTE — Telephone Encounter (Signed)
Patient requested. Phoned to pharmacy.  

## 2017-11-05 ENCOUNTER — Other Ambulatory Visit: Payer: Self-pay | Admitting: *Deleted

## 2017-11-05 MED ORDER — OXYCODONE HCL 10 MG PO TABS: 10.0000 mg | ORAL_TABLET | Freq: Four times a day (QID) | ORAL | 0 refills | Status: DC

## 2017-11-05 MED ORDER — ZOLPIDEM TARTRATE 10 MG PO TABS: 10.0000 mg | ORAL_TABLET | Freq: Every day | ORAL | 0 refills | Status: DC

## 2017-11-05 NOTE — Telephone Encounter (Signed)
Patient requested refill [place] Verified [facility] 10/07/17 Pharmacy Confirmed Pended Rx's and sent to Dr. Mariea Clonts for approval.

## 2017-11-09 ENCOUNTER — Encounter: Payer: Self-pay | Admitting: Internal Medicine

## 2017-11-09 ENCOUNTER — Ambulatory Visit (INDEPENDENT_AMBULATORY_CARE_PROVIDER_SITE_OTHER): Payer: PRIVATE HEALTH INSURANCE | Admitting: Internal Medicine

## 2017-11-09 ENCOUNTER — Ambulatory Visit: Payer: PRIVATE HEALTH INSURANCE | Admitting: Internal Medicine

## 2017-11-09 VITALS — BP 124/60 | HR 80 | Temp 98.5°F | Wt 128.0 lb

## 2017-11-09 DIAGNOSIS — F439 Reaction to severe stress, unspecified: Secondary | ICD-10-CM

## 2017-11-09 DIAGNOSIS — R7401 Elevation of levels of liver transaminase levels: Secondary | ICD-10-CM

## 2017-11-09 DIAGNOSIS — G8929 Other chronic pain: Secondary | ICD-10-CM | POA: Diagnosis not present

## 2017-11-09 DIAGNOSIS — R112 Nausea with vomiting, unspecified: Secondary | ICD-10-CM

## 2017-11-09 DIAGNOSIS — R1011 Right upper quadrant pain: Secondary | ICD-10-CM | POA: Diagnosis not present

## 2017-11-09 DIAGNOSIS — Z79899 Other long term (current) drug therapy: Secondary | ICD-10-CM

## 2017-11-09 DIAGNOSIS — F17200 Nicotine dependence, unspecified, uncomplicated: Secondary | ICD-10-CM

## 2017-11-09 DIAGNOSIS — F1010 Alcohol abuse, uncomplicated: Secondary | ICD-10-CM

## 2017-11-09 DIAGNOSIS — R740 Nonspecific elevation of levels of transaminase and lactic acid dehydrogenase [LDH]: Secondary | ICD-10-CM

## 2017-11-09 MED ORDER — ESCITALOPRAM OXALATE 20 MG PO TABS: 20.0000 mg | ORAL_TABLET | Freq: Every day | ORAL | 3 refills | Status: DC

## 2017-11-09 NOTE — Patient Instructions (Signed)
Please continue to stay away from alcohol!  It was really damaging your liver and not helping your stress in the long run.  Continue to cut back on cigarettes.  I'm concerned you will develop lung cancer.    It's important to get your mammogram and colonoscopy yourself.  If you get sick, you won't be any help to your parents and sister.

## 2017-11-09 NOTE — Progress Notes (Signed)
Location:  [facility] clinic Provider:  Chidinma Clites L. Mariea Clonts, D.O., C.M.D.  Code Status: full code Goals of Care:  Advanced Directives 11/07/2016  Does Patient Have a Medical Advance Directive? No  Would patient like information on creating a medical advance directive? -   Chief Complaint  Patient presents with  . Medical Management of Chronic Issues    75mth follow-up    HPI: Patient is a 62 y.o. female seen today for medical management of chronic diseases.    Needs mammogram and cscope.  Says she has to take care of her parents first--her dad has COPD on oxygen and needs a pacemaker--his dementia is worsening.  Her mother is demanding.    Says she quit drinking alcohol 5 days ago.  She saw her labs from December when she got back here from [place] (where her parents live).    She's still smoking but less b/c her dad still asks for a cigarette despite needing oxygen.  She's trying not to smoke in front of him.  She had the flu twice (after she saw me and then again) and she lost weight down to 118 lbs.  She's gained 10 lbs back.  Her sister got some help for her parents.    Her back is killing her across her lower back and her abdomen is hurting.    Lexapro helped a little, but not much.  Still so stressed caring for her parents as above.    Past Medical History:  Diagnosis Date  . Abdominal pain 2000   chronic abdominal pain with nausea and vomiting  . Abnormal liver function   . Allergy   . Hypertension     Past Surgical History:  Procedure Laterality Date  . ABDOMINAL HYSTERECTOMY    . CHOLECYSTECTOMY      Allergies  Allergen Reactions  . Avelox [Moxifloxacin Hcl In Nacl]     Low blood pressure  . Compazine [Prochlorperazine Edisylate] Other (See Comments)    Caused lockjaw  . Ibuprofen Hives  . Morphine And Related Nausea And Vomiting  . Promethazine Hcl Nausea And Vomiting    Outpatient Encounter Medications as of 11/09/2017  Medication Sig  . amLODipine (NORVASC) 10 MG  tablet Take 1 tablet (10 mg total) by mouth daily.  . Cholecalciferol (VITAMIN D) 2000 UNITS CAPS Take 1 capsule (2,000 Units total) by mouth daily.  Marland Kitchen escitalopram (LEXAPRO) 10 MG tablet Take 1 tablet (10 mg total) by mouth daily.  Marland Kitchen lisinopril (PRINIVIL,ZESTRIL) 20 MG tablet Take 1 tablet (20 mg total) daily by mouth.  Marland Kitchen LORazepam (ATIVAN) 0.5 MG tablet Take 1 tablet (0.5 mg total) by mouth every 6 (six) hours as needed.  . metoprolol tartrate (LOPRESSOR) 25 MG tablet Take 1 tablet (25 mg total) by mouth 2 (two) times daily.  Marland Kitchen omeprazole (PRILOSEC) 20 MG capsule TAKE 1 CAPSULE BY MOUTH ONCE DAILY  . Oxycodone HCl 10 MG TABS Take 1 tablet (10 mg total) by mouth every 6 (six) hours.  . simvastatin (ZOCOR) 20 MG tablet Take 1 tablet (20 mg total) by mouth at bedtime.  Marland Kitchen zolpidem (AMBIEN) 10 MG tablet Take 1 tablet (10 mg total) by mouth at bedtime.   No facility-administered encounter medications on file as of 11/09/2017.     Review of Systems:  Review of Systems  Constitutional: Positive for weight loss. Negative for chills, fever and malaise/fatigue.  HENT: Negative for congestion and hearing loss.   Eyes: Negative for blurred vision.  Respiratory: Negative for cough  and shortness of breath.   Cardiovascular: Negative for chest pain, palpitations and leg swelling.  Gastrointestinal: Positive for abdominal pain. Negative for blood in stool, constipation, diarrhea, heartburn, melena, nausea and vomiting.  Genitourinary: Negative for dysuria.  Musculoskeletal: Positive for back pain. Negative for falls.  Skin: Negative for itching and rash.  Neurological: Negative for dizziness, loss of consciousness and weakness.  Endo/Heme/Allergies: Bruises/bleeds easily.  Psychiatric/Behavioral: Positive for depression. Negative for memory loss. The patient is nervous/anxious and has insomnia.     Health Maintenance  Topic Date Due  . COLONOSCOPY  08/25/2016  . MAMMOGRAM  01/16/2017  . PAP SMEAR   10/10/2018  . TETANUS/TDAP  09/27/2025  . INFLUENZA VACCINE  Completed  . Hepatitis C Screening  Completed  . HIV Screening  Completed    Physical Exam: Vitals:   11/09/17 1438  BP: 124/60  Pulse: 80  Temp: 98.5 F (36.9 C)  TempSrc: Oral  SpO2: 94%  Weight: 128 lb (58.1 kg)   Body mass index is 21.3 kg/m. Physical Exam  Constitutional: She is oriented to person, place, and time. No distress.  Chronically ill appearing female  HENT:  Head: Normocephalic and atraumatic.  Cardiovascular: Normal rate, regular rhythm, normal heart sounds and intact distal pulses.  Pulmonary/Chest: Effort normal and breath sounds normal. No respiratory distress.  Abdominal: Bowel sounds are normal. She exhibits no distension and no mass. There is tenderness. There is no rebound and no guarding.  No palpable hepatosplenomegaly  Musculoskeletal: Normal range of motion. She exhibits tenderness.  Lower lumbar region and bilateral upper quadrants of abdomen (mild)  Neurological: She is alert and oriented to person, place, and time. No cranial nerve deficit.  Skin: Skin is warm and dry.  Psychiatric: She has a normal mood and affect.    Labs reviewed: Basic Metabolic Panel: No results for input(s): NA, K, CL, CO2, GLUCOSE, BUN, CREATININE, CALCIUM, MG, PHOS, TSH in the last 8760 hours. Liver Function Tests: Recent Labs    02/09/17 0826 08/20/17 1604  AST 13 47*  ALT 15 33*  ALKPHOS 165*  --   BILITOT 0.3 0.7  PROT 6.5 6.3  ALBUMIN 3.9  --    Recent Labs    02/09/17 0826  LIPASE 13  AMYLASE 23   No results for input(s): AMMONIA in the last 8760 hours. CBC: Recent Labs    02/09/17 0826 08/20/17 1604  WBC 8.5 8.1  NEUTROABS 4,845 4,398  HGB 13.0 13.1  HCT 40.9 38.1  MCV 94.9 92.3  PLT 367 392    Lab Results  Component Value Date   HGBA1C 5.1 02/09/2017    Assessment/Plan 1. Stress - only slightly better on 10mg  lexapro--will up to 20mg  (she'll take 2 of the 10mg  pills  until she uses those up) - escitalopram (LEXAPRO) 20 MG tablet; Take 1 tablet (20 mg total) by mouth daily.  Dispense: 90 tablet; Refill: 3  2. Elevated transaminase level - much worse on last labs due to excessive alcohol intake recently--was using as coping mechanism - recheck labs today as she reports quitting alcohol now 5 days ago - Hepatic function panel - Basic metabolic panel  3. Intractable vomiting with nausea, unspecified vomiting type - continues to have spells of this and has had prior full workups with GI, but no explanation provided - Hepatic function panel - Basic metabolic panel  4. Tobacco use disorder -ongoing, not ready to quit smoking also at this point, but smoking less than she once did  5. Chronic RUQ pain -ongoing, not helped with recent alcohol abuse, cont off alcohol - Hepatic function panel - Basic metabolic panel  6. Alcohol abuse - reports quitting 5 days ago and eating some better since (lost weight) - Hepatic function panel - Basic metabolic panel  Labs/tests ordered:  Check f/u liver tests today; check oral drug screen and blood alcohol next visit  Next appt:  02/15/2018 med mgt--f/u on liver, drug screens today   Louvina Cleary L. Daysen Gundrum, D.O. [facility] [address], [place] [address] Cell Phone (Mon-Fri 8am-5pm):  [phone] On Call:  [phone] & follow prompts after 5pm & weekends Office Phone:  [phone] Office Fax:  [phone]

## 2017-11-10 LAB — BASIC METABOLIC PANEL
BUN: 7 mg/dL (ref 7–25)
CO2: 29 mmol/L (ref 20–32)
Calcium: 9 mg/dL (ref 8.6–10.4)
Chloride: 105 mmol/L (ref 98–110)
Creat: 0.61 mg/dL (ref 0.50–0.99)
Glucose, Bld: 96 mg/dL (ref 65–139)
Potassium: 3.1 mmol/L — ABNORMAL LOW (ref 3.5–5.3)
Sodium: 141 mmol/L (ref 135–146)

## 2017-11-10 LAB — HEPATIC FUNCTION PANEL
AG Ratio: 1.8 (calc) (ref 1.0–2.5)
ALT: 49 U/L — ABNORMAL HIGH (ref 6–29)
AST: 32 U/L (ref 10–35)
Albumin: 3.9 g/dL (ref 3.6–5.1)
Alkaline phosphatase (APISO): 194 U/L — ABNORMAL HIGH (ref 33–130)
Bilirubin, Direct: 0.2 mg/dL (ref 0.0–0.2)
Globulin: 2.2 g/dL (calc) (ref 1.9–3.7)
Indirect Bilirubin: 0.4 mg/dL (calc) (ref 0.2–1.2)
Total Bilirubin: 0.6 mg/dL (ref 0.2–1.2)
Total Protein: 6.1 g/dL (ref 6.1–8.1)

## 2017-11-13 ENCOUNTER — Other Ambulatory Visit: Payer: Self-pay | Admitting: Internal Medicine

## 2017-12-04 ENCOUNTER — Other Ambulatory Visit: Payer: Self-pay | Admitting: *Deleted

## 2017-12-04 DIAGNOSIS — F439 Reaction to severe stress, unspecified: Secondary | ICD-10-CM

## 2017-12-04 MED ORDER — OXYCODONE HCL 10 MG PO TABS: 10.0000 mg | ORAL_TABLET | Freq: Four times a day (QID) | ORAL | 0 refills | Status: DC

## 2017-12-04 MED ORDER — ZOLPIDEM TARTRATE 10 MG PO TABS: 10.0000 mg | ORAL_TABLET | Freq: Every day | ORAL | 0 refills | Status: DC

## 2017-12-04 MED ORDER — ESCITALOPRAM OXALATE 20 MG PO TABS: 20.0000 mg | ORAL_TABLET | Freq: Every day | ORAL | 0 refills | Status: DC

## 2017-12-04 NOTE — Telephone Encounter (Signed)
Database checked and verified, for [place] pt's overdose risk score is 600

## 2017-12-21 ENCOUNTER — Other Ambulatory Visit: Payer: Self-pay | Admitting: *Deleted

## 2017-12-21 ENCOUNTER — Ambulatory Visit: Payer: PRIVATE HEALTH INSURANCE | Admitting: Internal Medicine

## 2017-12-21 DIAGNOSIS — F411 Generalized anxiety disorder: Secondary | ICD-10-CM

## 2017-12-21 MED ORDER — LORAZEPAM 0.5 MG PO TABS: 0.5000 mg | ORAL_TABLET | Freq: Four times a day (QID) | ORAL | 0 refills | Status: DC | PRN

## 2017-12-21 NOTE — Telephone Encounter (Signed)
Patient requested.  Phoned to pharmacy [facility] Verified.

## 2017-12-31 ENCOUNTER — Other Ambulatory Visit: Payer: Self-pay | Admitting: *Deleted

## 2017-12-31 MED ORDER — OXYCODONE HCL 10 MG PO TABS: 10.0000 mg | ORAL_TABLET | Freq: Four times a day (QID) | ORAL | 0 refills | Status: DC

## 2017-12-31 MED ORDER — ZOLPIDEM TARTRATE 10 MG PO TABS: 10.0000 mg | ORAL_TABLET | Freq: Every day | ORAL | 0 refills | Status: DC

## 2017-12-31 NOTE — Telephone Encounter (Signed)
Patient requested refills [place] Verified LR: 12/04/2017 Pharmacy Confirmed Pended Rxs and sent to Dr. Mariea Clonts for approval.

## 2018-01-02 ENCOUNTER — Other Ambulatory Visit: Payer: Self-pay | Admitting: Internal Medicine

## 2018-01-02 DIAGNOSIS — G8929 Other chronic pain: Secondary | ICD-10-CM

## 2018-01-02 DIAGNOSIS — F5101 Primary insomnia: Secondary | ICD-10-CM

## 2018-01-02 DIAGNOSIS — R1011 Right upper quadrant pain: Principal | ICD-10-CM

## 2018-01-02 MED ORDER — OXYCODONE HCL 10 MG PO TABS: 10.0000 mg | ORAL_TABLET | Freq: Four times a day (QID) | ORAL | 0 refills | Status: DC

## 2018-01-02 MED ORDER — ZOLPIDEM TARTRATE 10 MG PO TABS: 10.0000 mg | ORAL_TABLET | Freq: Every day | ORAL | 0 refills | Status: DC

## 2018-01-02 NOTE — Progress Notes (Signed)
Note, pt called on Saturday b/c Rx for oxycodone got sent to [facility] pharmacy when she remains in [place] taking care of her parents.  New order entered to send Rx to [facility].  Will call and cancel local oxycodone refill.  This was also the case for the ambien.  New order entered and old order canceled.

## 2018-01-07 ENCOUNTER — Other Ambulatory Visit: Payer: Self-pay | Admitting: Internal Medicine

## 2018-01-07 DIAGNOSIS — G8929 Other chronic pain: Secondary | ICD-10-CM

## 2018-01-07 DIAGNOSIS — R1011 Right upper quadrant pain: Principal | ICD-10-CM

## 2018-02-01 ENCOUNTER — Other Ambulatory Visit: Payer: Self-pay | Admitting: *Deleted

## 2018-02-01 DIAGNOSIS — G8929 Other chronic pain: Secondary | ICD-10-CM

## 2018-02-01 DIAGNOSIS — R1011 Right upper quadrant pain: Secondary | ICD-10-CM

## 2018-02-01 DIAGNOSIS — F5101 Primary insomnia: Secondary | ICD-10-CM

## 2018-02-01 MED ORDER — ZOLPIDEM TARTRATE 10 MG PO TABS: 10.0000 mg | ORAL_TABLET | Freq: Every day | ORAL | 0 refills | Status: DC

## 2018-02-01 MED ORDER — OXYCODONE HCL 10 MG PO TABS: 10.0000 mg | ORAL_TABLET | Freq: Four times a day (QID) | ORAL | 0 refills | Status: DC

## 2018-02-01 NOTE — Telephone Encounter (Signed)
Patient requested [place] Verified LR: 01/02/2018 Pharmacy Confirmed Pended Rx and sent to Dr. Mariea Clonts for approval.

## 2018-02-15 ENCOUNTER — Other Ambulatory Visit: Payer: Self-pay | Admitting: *Deleted

## 2018-02-15 ENCOUNTER — Ambulatory Visit: Payer: PRIVATE HEALTH INSURANCE | Admitting: Internal Medicine

## 2018-02-15 DIAGNOSIS — F411 Generalized anxiety disorder: Secondary | ICD-10-CM

## 2018-02-15 MED ORDER — LORAZEPAM 0.5 MG PO TABS
0.5000 mg | ORAL_TABLET | Freq: Four times a day (QID) | ORAL | 0 refills | Status: DC | PRN
Start: 2018-02-15 — End: 2018-04-16

## 2018-02-15 NOTE — Telephone Encounter (Signed)
Patient requested. Phoned to pharmacy in [place]

## 2018-02-22 ENCOUNTER — Encounter: Payer: Self-pay | Admitting: Internal Medicine

## 2018-02-22 ENCOUNTER — Ambulatory Visit: Payer: PRIVATE HEALTH INSURANCE | Admitting: Internal Medicine

## 2018-02-22 VITALS — BP 120/78 | HR 69 | Temp 98.7°F | Ht 65.0 in | Wt 123.0 lb

## 2018-02-22 DIAGNOSIS — F1010 Alcohol abuse, uncomplicated: Secondary | ICD-10-CM | POA: Diagnosis not present

## 2018-02-22 DIAGNOSIS — F17200 Nicotine dependence, unspecified, uncomplicated: Secondary | ICD-10-CM

## 2018-02-22 DIAGNOSIS — R634 Abnormal weight loss: Secondary | ICD-10-CM

## 2018-02-22 DIAGNOSIS — F5101 Primary insomnia: Secondary | ICD-10-CM

## 2018-02-22 DIAGNOSIS — R1011 Right upper quadrant pain: Secondary | ICD-10-CM

## 2018-02-22 DIAGNOSIS — F439 Reaction to severe stress, unspecified: Secondary | ICD-10-CM | POA: Diagnosis not present

## 2018-02-22 DIAGNOSIS — R740 Nonspecific elevation of levels of transaminase and lactic acid dehydrogenase [LDH]: Secondary | ICD-10-CM

## 2018-02-22 DIAGNOSIS — R7401 Elevation of levels of liver transaminase levels: Secondary | ICD-10-CM

## 2018-02-22 DIAGNOSIS — G8929 Other chronic pain: Secondary | ICD-10-CM

## 2018-02-22 NOTE — Progress Notes (Signed)
Location:  [facility] clinic Provider:  Reda Gettis L. Mariea Clonts, D.O., C.M.D.  Code Status: full code Goals of Care:  Advanced Directives 02/22/2018  Does Patient Have a Medical Advance Directive? No  Would patient like information on creating a medical advance directive? No - Patient declined     Chief Complaint  Patient presents with  . Medical Management of Chronic Issues    43mth follow-up    HPI: Patient is a 62 y.o. female seen today for medical management of chronic diseases.    Came back to check on her condo and for her doctor's appt.  She's still losing weight.  She has fallen off the wagon with drinking, but not having half as much as she has.  Still very stressed with her parents' care.  She still has not had her mammogram or colonoscopy.  Yesterday, had half a [place] mike's sub, three little chocolate candies, sweet tea and a banana.    Caregiver is not doing the shopping for her mom like she should.  She's gone from a 12 to a 6.  She's now 123 lbs and was 128 lbs in march--lost 5 more lbs in 4 mos.  Her neighbor does cook for her and bring her food.    She is doing walks instead of drinking. Takes her dad and the dog.    BP is fine.  Smoking--has cut down as she cannot smoke in the house.  Goes to bed at 7:30pm and gets up at 7:30am. Will eat candy in the middle of the night.  Does not smoke now in the middle of the night. She is not coughing or wheezing.    Past Medical History:  Diagnosis Date  . Abdominal pain 2000   chronic abdominal pain with nausea and vomiting  . Abnormal liver function   . Allergy   . Hypertension     Past Surgical History:  Procedure Laterality Date  . ABDOMINAL HYSTERECTOMY    . CHOLECYSTECTOMY      Allergies  Allergen Reactions  . Avelox [Moxifloxacin Hcl In Nacl]     Low blood pressure  . Compazine [Prochlorperazine Edisylate] Other (See Comments)    Caused lockjaw  . Ibuprofen Hives  . Morphine And Related Nausea And Vomiting  .  Promethazine Hcl Nausea And Vomiting    Outpatient Encounter Medications as of 02/22/2018  Medication Sig  . amLODipine (NORVASC) 10 MG tablet take 1 tablet by mouth once daily  . Cholecalciferol (VITAMIN D) 2000 UNITS CAPS Take 1 capsule (2,000 Units total) by mouth daily.  Marland Kitchen escitalopram (LEXAPRO) 20 MG tablet Take 1 tablet (20 mg total) by mouth daily.  Marland Kitchen lisinopril (PRINIVIL,ZESTRIL) 20 MG tablet Take 1 tablet (20 mg total) daily by mouth.  Marland Kitchen LORazepam (ATIVAN) 0.5 MG tablet Take 1 tablet (0.5 mg total) by mouth every 6 (six) hours as needed.  . metoprolol tartrate (LOPRESSOR) 25 MG tablet Take 1 tablet (25 mg total) by mouth 2 (two) times daily.  Marland Kitchen omeprazole (PRILOSEC) 20 MG capsule TAKE 1 CAPSULE BY MOUTH ONCE DAILY  . Oxycodone HCl 10 MG TABS Take 1 tablet (10 mg total) by mouth every 6 (six) hours.  . simvastatin (ZOCOR) 20 MG tablet Take 1 tablet (20 mg total) by mouth at bedtime.  Marland Kitchen zolpidem (AMBIEN) 10 MG tablet Take 1 tablet (10 mg total) by mouth at bedtime.   No facility-administered encounter medications on file as of 02/22/2018.     Review of Systems:  Review of Systems  Constitutional: Positive for malaise/fatigue and weight loss. Negative for chills and fever.  HENT: Negative for congestion and hearing loss.   Eyes: Negative for blurred vision.  Respiratory: Negative for cough, hemoptysis, sputum production, shortness of breath and wheezing.   Cardiovascular: Negative for chest pain, palpitations and leg swelling.  Gastrointestinal: Positive for abdominal pain, nausea and vomiting. Negative for blood in stool, constipation, diarrhea, heartburn and melena.  Genitourinary: Negative for dysuria.  Musculoskeletal: Negative for falls and joint pain.  Skin: Negative for itching and rash.  Neurological: Negative for dizziness and loss of consciousness.  Endo/Heme/Allergies: Bruises/bleeds easily.  Psychiatric/Behavioral: Positive for depression. Negative for memory loss. The  patient is nervous/anxious and has insomnia.     Health Maintenance  Topic Date Due  . COLONOSCOPY  08/25/2016  . MAMMOGRAM  01/16/2017  . INFLUENZA VACCINE  03/25/2018  . PAP SMEAR  10/10/2018  . TETANUS/TDAP  09/27/2025  . Hepatitis C Screening  Completed  . HIV Screening  Completed    Physical Exam: Vitals:   02/22/18 1514  BP: 120/78  Pulse: 69  Temp: 98.7 F (37.1 C)  TempSrc: Oral  SpO2: 98%  Weight: 123 lb (55.8 kg)  Height: 5\' 5"  (1.651 m)   Body mass index is 20.47 kg/m. Physical Exam  Constitutional: She is oriented to person, place, and time. No distress.  Thin female, loose skin from rapid weight loss  Cardiovascular: Normal rate, regular rhythm, normal heart sounds and intact distal pulses.  Pulmonary/Chest: Effort normal and breath sounds normal. No respiratory distress. She has no wheezes.  Abdominal: Bowel sounds are normal.  Neurological: She is alert and oriented to person, place, and time.  Skin: Skin is warm and dry.  Psychiatric:  Pressured speech    Labs reviewed: Basic Metabolic Panel: Recent Labs    11/09/17 1507  NA 141  K 3.1*  CL 105  CO2 29  GLUCOSE 96  BUN 7  CREATININE 0.61  CALCIUM 9.0   Liver Function Tests: Recent Labs    08/20/17 1604 11/09/17 1507  AST 47* 32  ALT 33* 49*  BILITOT 0.7 0.6  PROT 6.3 6.1   No results for input(s): LIPASE, AMYLASE in the last 8760 hours. No results for input(s): AMMONIA in the last 8760 hours. CBC: Recent Labs    08/20/17 1604  WBC 8.1  NEUTROABS 4,398  HGB 13.1  HCT 38.1  MCV 92.3  PLT 392   Lipid Panel: No results for input(s): CHOL, HDL, LDLCALC, TRIG, CHOLHDL, LDLDIRECT in the last 8760 hours. Lab Results  Component Value Date   HGBA1C 5.1 02/09/2017    Assessment/Plan Weight loss:  Unintentional, appetite poor and not eating much; counseled on importance of increased intake--her friend/neighbor is helping with this, to drink and smoke less and eat more, walk  instead of smoking or drinking for stress relief -I'm concerned that there is more to her weight loss and she needs her regular screenings -given Rxs to get her mammogram and her colonoscopy done in [place] since she is there almost full time and have reports sent to me -she says she's worried about it being colon cancer though the many CTs she's had over the years have not revealed any obvious masses for her RUQ pain  1. Primary insomnia -ongoing, continues on ambien  2. Stress -ongoing with parents both with dementia and mother who is said to be very demanding -traveling back and forth to [place] -continues on low dose ativan for panic attacks  3. Alcohol abuse -down to a glass of wine in two weeks by her report -had been drinking excessively at the beginning of the year  4. Tobacco use disorder -ongoing, but also less, not ready to quit altogether  5. Elevated transaminase level -improved some, cont to reduce alcohol intake  6. Chronic RUQ pain -ongoing, liver labs have improved some recently since decreasing alcohol intake -continues on oxycodone for severe pain -last drug screen consistent with meds she is on -filling rxs in [place] not here  Labs/tests ordered:  No orders of the defined types were placed in this encounter.   Next appt:  07/05/2018    Alee Gressman L. Imanol Bihl, D.O. [facility] [address], [place] [address] Cell Phone (Mon-Fri 8am-5pm):  [phone] On Call:  [phone] & follow prompts after 5pm & weekends Office Phone:  [phone] Office Fax:  [phone]

## 2018-03-02 ENCOUNTER — Other Ambulatory Visit: Payer: Self-pay

## 2018-03-02 DIAGNOSIS — G8929 Other chronic pain: Secondary | ICD-10-CM

## 2018-03-02 DIAGNOSIS — R1011 Right upper quadrant pain: Secondary | ICD-10-CM

## 2018-03-02 DIAGNOSIS — F5101 Primary insomnia: Secondary | ICD-10-CM

## 2018-03-02 MED ORDER — ZOLPIDEM TARTRATE 10 MG PO TABS: 10.0000 mg | ORAL_TABLET | Freq: Every day | ORAL | 0 refills | Status: DC

## 2018-03-02 MED ORDER — OXYCODONE HCL 10 MG PO TABS: 10.0000 mg | ORAL_TABLET | Freq: Four times a day (QID) | ORAL | 0 refills | Status: DC

## 2018-03-02 NOTE — Telephone Encounter (Signed)
Patient called to request refills on oxycodone and zolpidem. Patient would like these refills sent to the [facility] in [place]. I have check both the [place] and [place] for last fill date and both were last filled on 02/01/18. Request has been pended to provider for approval.

## 2018-03-21 ENCOUNTER — Other Ambulatory Visit: Payer: Self-pay | Admitting: Internal Medicine

## 2018-03-21 DIAGNOSIS — F439 Reaction to severe stress, unspecified: Secondary | ICD-10-CM

## 2018-03-31 ENCOUNTER — Other Ambulatory Visit: Payer: Self-pay | Admitting: *Deleted

## 2018-03-31 DIAGNOSIS — R1011 Right upper quadrant pain: Principal | ICD-10-CM

## 2018-03-31 DIAGNOSIS — G8929 Other chronic pain: Secondary | ICD-10-CM

## 2018-03-31 DIAGNOSIS — F5101 Primary insomnia: Secondary | ICD-10-CM

## 2018-03-31 NOTE — Telephone Encounter (Signed)
Patient requested [place] Database Verified LR: 03/02/2018 Pharmacy Verified Pended Rx and sent to Dr. Mariea Clonts for approval.

## 2018-03-31 NOTE — Telephone Encounter (Signed)
Can we verify [place]'s database also please since she is getting her prescriptions filled there now?  Thanks.

## 2018-04-01 ENCOUNTER — Other Ambulatory Visit: Payer: Self-pay | Admitting: Internal Medicine

## 2018-04-01 MED ORDER — OXYCODONE HCL 10 MG PO TABS: 10.0000 mg | ORAL_TABLET | Freq: Four times a day (QID) | ORAL | 0 refills | Status: DC

## 2018-04-01 MED ORDER — ZOLPIDEM TARTRATE 10 MG PO TABS: 10.0000 mg | ORAL_TABLET | Freq: Every day | ORAL | 0 refills | Status: DC

## 2018-04-01 NOTE — Telephone Encounter (Signed)
I did Verify [facility] Database and LR date is 03/02/2018. The verification I did was from the [place].   Message wasn't routed back to me so I am just seeing this message. Patient called back confirming refill request.

## 2018-04-12 LAB — HM MAMMOGRAPHY

## 2018-04-16 ENCOUNTER — Other Ambulatory Visit: Payer: Self-pay | Admitting: *Deleted

## 2018-04-16 DIAGNOSIS — F411 Generalized anxiety disorder: Secondary | ICD-10-CM

## 2018-04-16 MED ORDER — LORAZEPAM 0.5 MG PO TABS: 0.5000 mg | ORAL_TABLET | Freq: Four times a day (QID) | ORAL | 0 refills | Status: DC | PRN

## 2018-04-16 NOTE — Telephone Encounter (Signed)
Database checked and verified rx going to [facility]

## 2018-04-20 ENCOUNTER — Encounter: Payer: Self-pay | Admitting: *Deleted

## 2018-04-29 ENCOUNTER — Other Ambulatory Visit: Payer: Self-pay | Admitting: *Deleted

## 2018-04-29 DIAGNOSIS — Z86010 Personal history of colonic polyps: Secondary | ICD-10-CM | POA: Insufficient documentation

## 2018-04-29 DIAGNOSIS — G8929 Other chronic pain: Secondary | ICD-10-CM

## 2018-04-29 DIAGNOSIS — K219 Gastro-esophageal reflux disease without esophagitis: Secondary | ICD-10-CM | POA: Insufficient documentation

## 2018-04-29 DIAGNOSIS — F5101 Primary insomnia: Secondary | ICD-10-CM

## 2018-04-29 DIAGNOSIS — R1011 Right upper quadrant pain: Principal | ICD-10-CM

## 2018-04-29 MED ORDER — ZOLPIDEM TARTRATE 10 MG PO TABS: 10.0000 mg | ORAL_TABLET | Freq: Every day | ORAL | 0 refills | Status: DC

## 2018-04-29 MED ORDER — OXYCODONE HCL 10 MG PO TABS: 10.0000 mg | ORAL_TABLET | Freq: Four times a day (QID) | ORAL | 0 refills | Status: DC

## 2018-04-29 NOTE — Telephone Encounter (Signed)
Patient requested refill [place] Verified LR: 04/01/2018 (Patient going to take a leave from parents tomorrow and needs to refill a day early) Pharmacy Confirmed Pended Rx and sent to Dr. Mariea Clonts for approval.

## 2018-05-13 ENCOUNTER — Other Ambulatory Visit: Payer: Self-pay | Admitting: *Deleted

## 2018-05-14 ENCOUNTER — Other Ambulatory Visit: Payer: Self-pay | Admitting: *Deleted

## 2018-05-14 DIAGNOSIS — F411 Generalized anxiety disorder: Secondary | ICD-10-CM

## 2018-05-14 MED ORDER — LORAZEPAM 0.5 MG PO TABS: 0.5000 mg | ORAL_TABLET | Freq: Four times a day (QID) | ORAL | 0 refills | Status: DC | PRN

## 2018-05-14 NOTE — Telephone Encounter (Signed)
Patient requested refill.  [place] Verified LR: 04/18/18 Pended Rx and sent to Dr. Mariea Clonts for approval.

## 2018-05-27 ENCOUNTER — Other Ambulatory Visit: Payer: Self-pay | Admitting: *Deleted

## 2018-05-27 DIAGNOSIS — R1011 Right upper quadrant pain: Principal | ICD-10-CM

## 2018-05-27 DIAGNOSIS — G8929 Other chronic pain: Secondary | ICD-10-CM

## 2018-05-27 DIAGNOSIS — F5101 Primary insomnia: Secondary | ICD-10-CM

## 2018-05-27 MED ORDER — ZOLPIDEM TARTRATE 10 MG PO TABS: 10.0000 mg | ORAL_TABLET | Freq: Every day | ORAL | 0 refills | Status: DC

## 2018-05-27 MED ORDER — OXYCODONE HCL 10 MG PO TABS: 10.0000 mg | ORAL_TABLET | Freq: Four times a day (QID) | ORAL | 0 refills | Status: DC

## 2018-05-27 NOTE — Telephone Encounter (Signed)
Patient requested refill [place] Verified(for [place]) LR: 04/30/2018 Pharmacy Confirmed Pended Rx and sent to Dr. Mariea Clonts for approval.

## 2018-05-31 ENCOUNTER — Other Ambulatory Visit: Payer: Self-pay | Admitting: Internal Medicine

## 2018-05-31 DIAGNOSIS — F439 Reaction to severe stress, unspecified: Secondary | ICD-10-CM

## 2018-06-07 ENCOUNTER — Telehealth: Payer: Self-pay | Admitting: *Deleted

## 2018-06-07 NOTE — Telephone Encounter (Signed)
Patient called and left message on Clinical Intake. Stated that her cell phone is not working and to call her back tomorrow at her parents home number [phone]. Stated that her BP has been 90/60-94/62 and she has passed out a couple times within 2 weeks. Stated that she has NOT taken the Lopressor but is taking her Lisinopril and Norvasc. Her weight is 118 and she has a Colonoscopy set up for 06/29/18. Please Advise.

## 2018-06-07 NOTE — Telephone Encounter (Signed)
Stop norvasc and lisinopril. Continue to monitor bp and call back in one more week with results.

## 2018-06-08 NOTE — Telephone Encounter (Signed)
Patient notified and agreed.  

## 2018-06-08 NOTE — Telephone Encounter (Signed)
Medication list updated.

## 2018-06-08 NOTE — Addendum Note (Signed)
Addended by: Rafael Bihari A on: 06/08/2018 10:18 AM   Modules accepted: Orders

## 2018-06-17 ENCOUNTER — Telehealth: Payer: Self-pay | Admitting: *Deleted

## 2018-06-17 NOTE — Telephone Encounter (Signed)
These look ok other than the last one.  Not sure what was going on that day.

## 2018-06-17 NOTE — Telephone Encounter (Signed)
Patient notified and agreed.  

## 2018-06-17 NOTE — Telephone Encounter (Signed)
Patient called and stated that she has been watching her blood pressure. Not currently taking any BP medication.  Please Advise.  06/14/18- 140/100 06/13/18- 130/72 06/12/18- 120/82 06/11/18- 110/70 06/10/18- 110/70

## 2018-06-17 NOTE — Telephone Encounter (Signed)
LM with dad for patient to [facility].

## 2018-06-24 ENCOUNTER — Other Ambulatory Visit: Payer: Self-pay | Admitting: *Deleted

## 2018-06-24 DIAGNOSIS — G8929 Other chronic pain: Secondary | ICD-10-CM

## 2018-06-24 DIAGNOSIS — F5101 Primary insomnia: Secondary | ICD-10-CM

## 2018-06-24 DIAGNOSIS — R1011 Right upper quadrant pain: Principal | ICD-10-CM

## 2018-06-24 NOTE — Telephone Encounter (Signed)
Please check the [place] database also and document for this patient.

## 2018-06-24 NOTE — Telephone Encounter (Signed)
Patient requested refill [facility] [place] Verified LR: 05/28/2018 Pended Rx and sent to Dr. Mariea Clonts for approval.

## 2018-06-25 MED ORDER — OXYCODONE HCL 10 MG PO TABS: 10.0000 mg | ORAL_TABLET | Freq: Four times a day (QID) | ORAL | 0 refills | Status: DC

## 2018-06-25 MED ORDER — ZOLPIDEM TARTRATE 10 MG PO TABS: 10.0000 mg | ORAL_TABLET | Freq: Every day | ORAL | 0 refills | Status: DC

## 2018-06-25 NOTE — Telephone Encounter (Signed)
[facility] was checked last filled 05/28/2018 , overdose risk score 600

## 2018-06-29 LAB — HM COLONOSCOPY

## 2018-06-30 ENCOUNTER — Encounter: Payer: Self-pay | Admitting: *Deleted

## 2018-06-30 NOTE — Progress Notes (Signed)
Colonoscopy: [place] in [place] #[phone]

## 2018-07-05 ENCOUNTER — Encounter: Payer: Self-pay | Admitting: Internal Medicine

## 2018-07-05 ENCOUNTER — Ambulatory Visit (INDEPENDENT_AMBULATORY_CARE_PROVIDER_SITE_OTHER): Payer: PRIVATE HEALTH INSURANCE | Admitting: Internal Medicine

## 2018-07-05 VITALS — BP 138/80 | HR 77 | Temp 98.1°F | Ht 65.0 in | Wt 125.0 lb

## 2018-07-05 DIAGNOSIS — G8929 Other chronic pain: Secondary | ICD-10-CM

## 2018-07-05 DIAGNOSIS — F1010 Alcohol abuse, uncomplicated: Secondary | ICD-10-CM

## 2018-07-05 DIAGNOSIS — R634 Abnormal weight loss: Secondary | ICD-10-CM | POA: Diagnosis not present

## 2018-07-05 DIAGNOSIS — F439 Reaction to severe stress, unspecified: Secondary | ICD-10-CM

## 2018-07-05 DIAGNOSIS — F411 Generalized anxiety disorder: Secondary | ICD-10-CM

## 2018-07-05 DIAGNOSIS — R1011 Right upper quadrant pain: Secondary | ICD-10-CM | POA: Diagnosis not present

## 2018-07-05 DIAGNOSIS — F17200 Nicotine dependence, unspecified, uncomplicated: Secondary | ICD-10-CM | POA: Diagnosis not present

## 2018-07-05 DIAGNOSIS — I952 Hypotension due to drugs: Secondary | ICD-10-CM

## 2018-07-05 DIAGNOSIS — M8589 Other specified disorders of bone density and structure, multiple sites: Secondary | ICD-10-CM

## 2018-07-05 MED ORDER — LORAZEPAM 0.5 MG PO TABS: 0.5000 mg | ORAL_TABLET | Freq: Four times a day (QID) | ORAL | 0 refills | Status: DC | PRN

## 2018-07-05 NOTE — Patient Instructions (Addendum)
We'll check your CT chest to screen for nodules from smoking in January. We'll also do your bone density at that time.  Continue to cut back on cigarettes and alcohol.    If your blood pressure is over 140/90, please take your lisinopril only.

## 2018-07-05 NOTE — Progress Notes (Signed)
Location:  [facility] clinic Provider:  Cammie Faulstich L. Mariea Clonts, D.O., C.M.D.  Goals of Care:  Advanced Directives 02/22/2018  Does Patient Have a Medical Advance Directive? No  Would patient like information on creating a medical advance directive? No - Patient declined   Chief Complaint  Patient presents with  . Medical Management of Chronic Issues    32mth follow-up    HPI: Patient is a 62 y.o. female seen today for medical management of chronic diseases.    She had 2 polyps on her colonoscopy and is due again in 3 years.  She weighs 125 lbs, but even less on her home scale.    She was down to 116 lbs and passed out with low blood pressure.  Off meds now.  She's doing deep breathing when bp gets up.    She had her mammogram which was normal.  She has no appetite, wasting away, in pain.  She has to lift her mom.  Her father is so confused.  She recovered all day yesterday after returning from [place].  She is ok with her mood here, but at home in [place], her mood is terrible.   Counseled about healthier diet.  Denies drugs.    She says she is only drinking once in a while and not a lot.  2 drinks tops and sometimes it's wine.    Her back is hurting her more especially when she's home.  Her mom is dead weight when she is there to help her.    She still smokes, but less than she was.    She is drinking protein plus instead of ensure which has better flavors.  Past Medical History:  Diagnosis Date  . Abdominal pain 2000   chronic abdominal pain with nausea and vomiting  . Abnormal liver function   . Allergy   . Hypertension     Past Surgical History:  Procedure Laterality Date  . ABDOMINAL HYSTERECTOMY    . CHOLECYSTECTOMY      Allergies  Allergen Reactions  . Avelox [Moxifloxacin Hcl In Nacl]     Low blood pressure  . Compazine [Prochlorperazine Edisylate] Other (See Comments)    Caused lockjaw  . Ibuprofen Hives  . Morphine And Related Nausea And Vomiting  . Promethazine Hcl  Nausea And Vomiting    Outpatient Encounter Medications as of 07/05/2018  Medication Sig  . Cholecalciferol (VITAMIN D) 2000 UNITS CAPS Take 1 capsule (2,000 Units total) by mouth daily.  Marland Kitchen escitalopram (LEXAPRO) 20 MG tablet take 1 tablet by mouth once daily  . famotidine (PEPCID) 20 MG tablet Take 20 mg by mouth 2 (two) times daily.   Marland Kitchen LORazepam (ATIVAN) 0.5 MG tablet Take 1 tablet (0.5 mg total) by mouth every 6 (six) hours as needed.  . Oxycodone HCl 10 MG TABS Take 1 tablet (10 mg total) by mouth every 6 (six) hours.  . simvastatin (ZOCOR) 20 MG tablet TAKE 1 TABLET BY MOUTH AT BEDTIME  . zolpidem (AMBIEN) 10 MG tablet Take 1 tablet (10 mg total) by mouth at bedtime.  . [DISCONTINUED] omeprazole (PRILOSEC) 20 MG capsule TAKE 1 CAPSULE BY MOUTH ONCE DAILY   No facility-administered encounter medications on file as of 07/05/2018.     Review of Systems:  Review of Systems  Constitutional: Positive for malaise/fatigue and weight loss. Negative for chills and fever.  HENT: Negative for congestion.   Respiratory: Negative for cough and shortness of breath.   Cardiovascular: Negative for chest pain, palpitations and  leg swelling.  Gastrointestinal: Positive for abdominal pain, nausea and vomiting. Negative for blood in stool, constipation, diarrhea, heartburn and melena.  Genitourinary: Negative for dysuria.  Musculoskeletal: Positive for back pain. Negative for falls.  Skin: Negative for itching and rash.  Neurological: Negative for dizziness and loss of consciousness.  Endo/Heme/Allergies: Does not bruise/bleed easily.  Psychiatric/Behavioral: Positive for depression. Negative for memory loss. The patient is nervous/anxious. The patient does not have insomnia.     Health Maintenance  Topic Date Due  . PAP SMEAR  10/10/2018  . MAMMOGRAM  04/12/2020  . TETANUS/TDAP  09/27/2025  . COLONOSCOPY  06/29/2028  . INFLUENZA VACCINE  Completed  . Hepatitis C Screening  Completed  . HIV  Screening  Completed    Physical Exam: Vitals:   07/05/18 1027  BP: 138/80  Pulse: 77  Temp: 98.1 F (36.7 C)  TempSrc: Oral  SpO2: 98%  Weight: 125 lb (56.7 kg)  Height: 5\' 5"  (1.651 m)   Body mass index is 20.8 kg/m. Physical Exam  Constitutional: She is oriented to person, place, and time.  Looks better than last visit, but still very thin now  HENT:  Head: Normocephalic and atraumatic.  Cardiovascular: Normal rate, regular rhythm, normal heart sounds and intact distal pulses.  Pulmonary/Chest: Effort normal and breath sounds normal. No respiratory distress.  Abdominal: Bowel sounds are normal.  Musculoskeletal: Normal range of motion. She exhibits tenderness.  Over lower back and chronically right upper quadrant  Neurological: She is alert and oriented to person, place, and time.  Skin: Skin is warm and dry. Capillary refill takes less than 2 seconds.  Psychiatric: She has a normal mood and affect.    Labs reviewed: Basic Metabolic Panel: Recent Labs    11/09/17 1507  NA 141  K 3.1*  CL 105  CO2 29  GLUCOSE 96  BUN 7  CREATININE 0.61  CALCIUM 9.0   Liver Function Tests: Recent Labs    08/20/17 1604 11/09/17 1507  AST 47* 32  ALT 33* 49*  BILITOT 0.7 0.6  PROT 6.3 6.1   No results for input(s): LIPASE, AMYLASE in the last 8760 hours. No results for input(s): AMMONIA in the last 8760 hours. CBC: Recent Labs    08/20/17 1604  WBC 8.1  NEUTROABS 4,398  HGB 13.1  HCT 38.1  MCV 92.3  PLT 392   Lipid Panel: No results for input(s): CHOL, HDL, LDLCALC, TRIG, CHOLHDL, LDLDIRECT in the last 8760 hours. Lab Results  Component Value Date   HGBA1C 5.1 02/09/2017    Assessment/Plan 1. Weight loss, unintentional -ongoing, has not been eating a healthy diet whatsoever, counseled on this  2. Chronic RUQ pain -still gets this, has been longstanding and had multiple GI workups -given her weight loss now, I have recommended she get CT  chest/abdomen/pelvis right away, but she wants to wait until the new year when she has medicare due to cost -cont  Oxycodone therapy which she's been on long-term, drug screen next visit  3. Tobacco use disorder -continues, not ready to quit with her stress levels caring for her parents in [place], has been counseled and reports smoking less  4. Alcohol abuse -reports not drinking a dangerous amount any longer, would be best not to drink at all due to her RUQ pains and elevated transaminases, etc  5. Stress -with caring for parents, cont lexapro therapy--might consider change to cymbalta or remeron next time  6. Hypotension due to drugs -resolved with coming off  meds, if high again over 140/90, to take 1/2 of her lisinopril tablet  7.  Osteopenia -f/u bone density in new year Continue vitamin D   Labs/tests ordered:  CT chest/abd/pelvis in [place]  Due to weight loss, smoking; bone density due to osteopenia  Next appt:  11/08/2018   Aarna Mihalko L. Haddy Mullinax, D.O. [facility] [address], [place] [address] Cell Phone (Mon-Fri 8am-5pm):  [phone] On Call:  [phone] & follow prompts after 5pm & weekends Office Phone:  [phone] Office Fax:  [phone]

## 2018-07-08 ENCOUNTER — Telehealth: Payer: Self-pay | Admitting: *Deleted

## 2018-07-08 NOTE — Telephone Encounter (Signed)
Precancerous polyps should not cause that, only cancerous ones.  Did they want her to be rechecked sooner than 3 years?

## 2018-07-08 NOTE — Telephone Encounter (Signed)
Patient called and stated that she received a call from her GI Dr. And they stated that her Polyps are Precancerous . Patient wants to know if this could be why she is losing weight or does that make any difference. Please Advise.

## 2018-07-09 NOTE — Telephone Encounter (Signed)
Discussed Dr.Reed's response with patient. Patient verbalized understanding.  Patient states the GI doctor recommended repeat in 3 years

## 2018-07-20 ENCOUNTER — Other Ambulatory Visit: Payer: Self-pay | Admitting: *Deleted

## 2018-07-20 DIAGNOSIS — F5101 Primary insomnia: Secondary | ICD-10-CM

## 2018-07-20 DIAGNOSIS — R1011 Right upper quadrant pain: Secondary | ICD-10-CM

## 2018-07-20 DIAGNOSIS — G8929 Other chronic pain: Secondary | ICD-10-CM

## 2018-07-20 MED ORDER — OXYCODONE HCL 10 MG PO TABS: 10.0000 mg | ORAL_TABLET | Freq: Four times a day (QID) | ORAL | 0 refills | Status: DC

## 2018-07-20 MED ORDER — ZOLPIDEM TARTRATE 10 MG PO TABS: 10.0000 mg | ORAL_TABLET | Freq: Every day | ORAL | 0 refills | Status: DC

## 2018-07-20 NOTE — Telephone Encounter (Signed)
Patient requested refill [place] Verified LR: 06/25/2018 Pended Rx and sent to Dr. Mariea Clonts for approval.

## 2018-08-20 ENCOUNTER — Other Ambulatory Visit: Payer: Self-pay | Admitting: *Deleted

## 2018-08-20 DIAGNOSIS — R1011 Right upper quadrant pain: Secondary | ICD-10-CM

## 2018-08-20 DIAGNOSIS — G8929 Other chronic pain: Secondary | ICD-10-CM

## 2018-08-20 DIAGNOSIS — F5101 Primary insomnia: Secondary | ICD-10-CM

## 2018-08-20 NOTE — Telephone Encounter (Signed)
Patient requested refill. [place] Database Verified.  LR: 07/23/2018 Pended Rx and sent to Arlo for approval. (covering for Dr. Mariea Clonts).

## 2018-08-20 NOTE — Telephone Encounter (Signed)
Please have Arlo take a look at this and send to pharmacy.

## 2018-08-21 ENCOUNTER — Other Ambulatory Visit: Payer: Self-pay | Admitting: Internal Medicine

## 2018-08-21 DIAGNOSIS — G8929 Other chronic pain: Secondary | ICD-10-CM

## 2018-08-21 DIAGNOSIS — R1011 Right upper quadrant pain: Secondary | ICD-10-CM

## 2018-08-21 DIAGNOSIS — F5101 Primary insomnia: Secondary | ICD-10-CM

## 2018-08-23 NOTE — Telephone Encounter (Signed)
Last filled in [place] 07/23/18 Database checked and verified

## 2018-09-03 ENCOUNTER — Other Ambulatory Visit: Payer: Self-pay | Admitting: Internal Medicine

## 2018-09-03 DIAGNOSIS — F439 Reaction to severe stress, unspecified: Secondary | ICD-10-CM

## 2018-09-06 ENCOUNTER — Other Ambulatory Visit: Payer: Self-pay | Admitting: *Deleted

## 2018-09-06 DIAGNOSIS — F411 Generalized anxiety disorder: Secondary | ICD-10-CM

## 2018-09-06 MED ORDER — LORAZEPAM 0.5 MG PO TABS: 0.5000 mg | ORAL_TABLET | Freq: Four times a day (QID) | ORAL | 0 refills | Status: DC | PRN

## 2018-09-21 ENCOUNTER — Other Ambulatory Visit: Payer: Self-pay | Admitting: Internal Medicine

## 2018-09-21 DIAGNOSIS — F5101 Primary insomnia: Secondary | ICD-10-CM

## 2018-09-21 DIAGNOSIS — R1011 Right upper quadrant pain: Principal | ICD-10-CM

## 2018-09-21 DIAGNOSIS — G8929 Other chronic pain: Secondary | ICD-10-CM

## 2018-09-21 NOTE — Telephone Encounter (Signed)
Last filled 08/23/2018 Database checked and verified

## 2018-09-26 IMAGING — DX DG CHEST 2V
2 series · 2 of 2 positions shown · non-contrast
Comparison: 04/23/2014 .

CLINICAL DATA: Cough.

EXAM:
CHEST  2 VIEW

[chest pa]
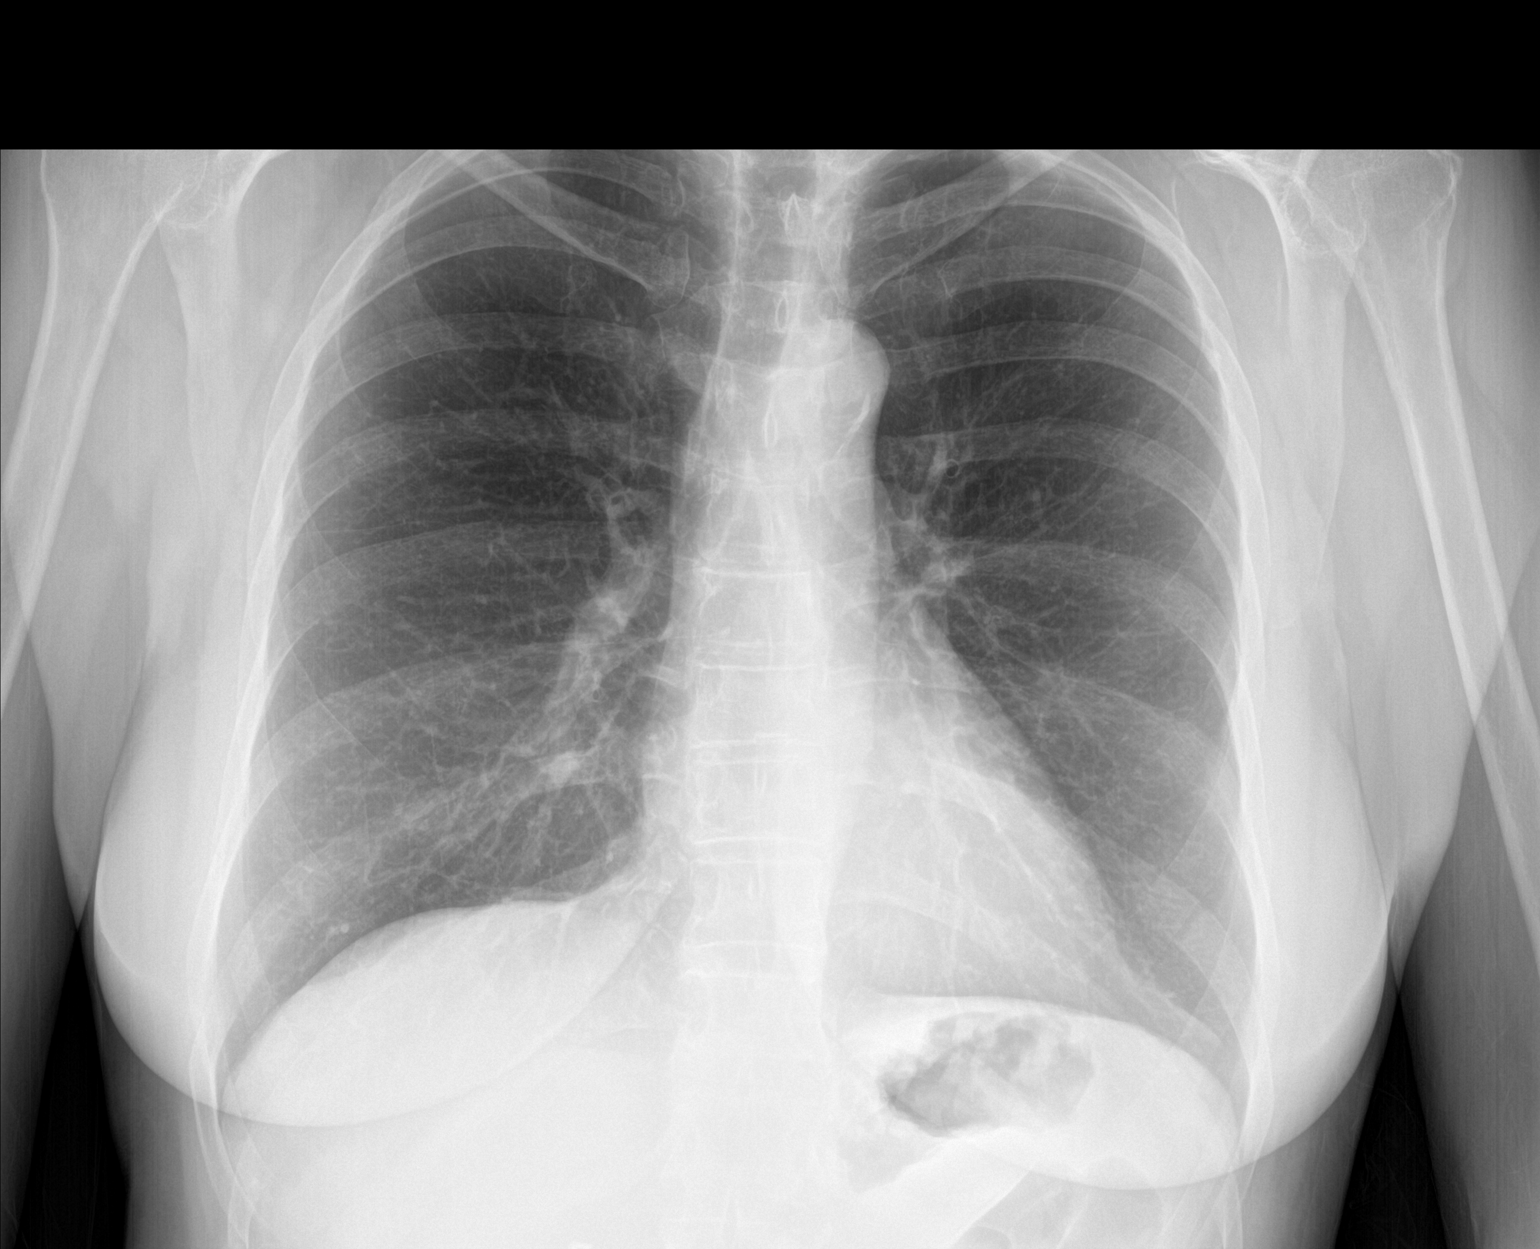

[chest lat]
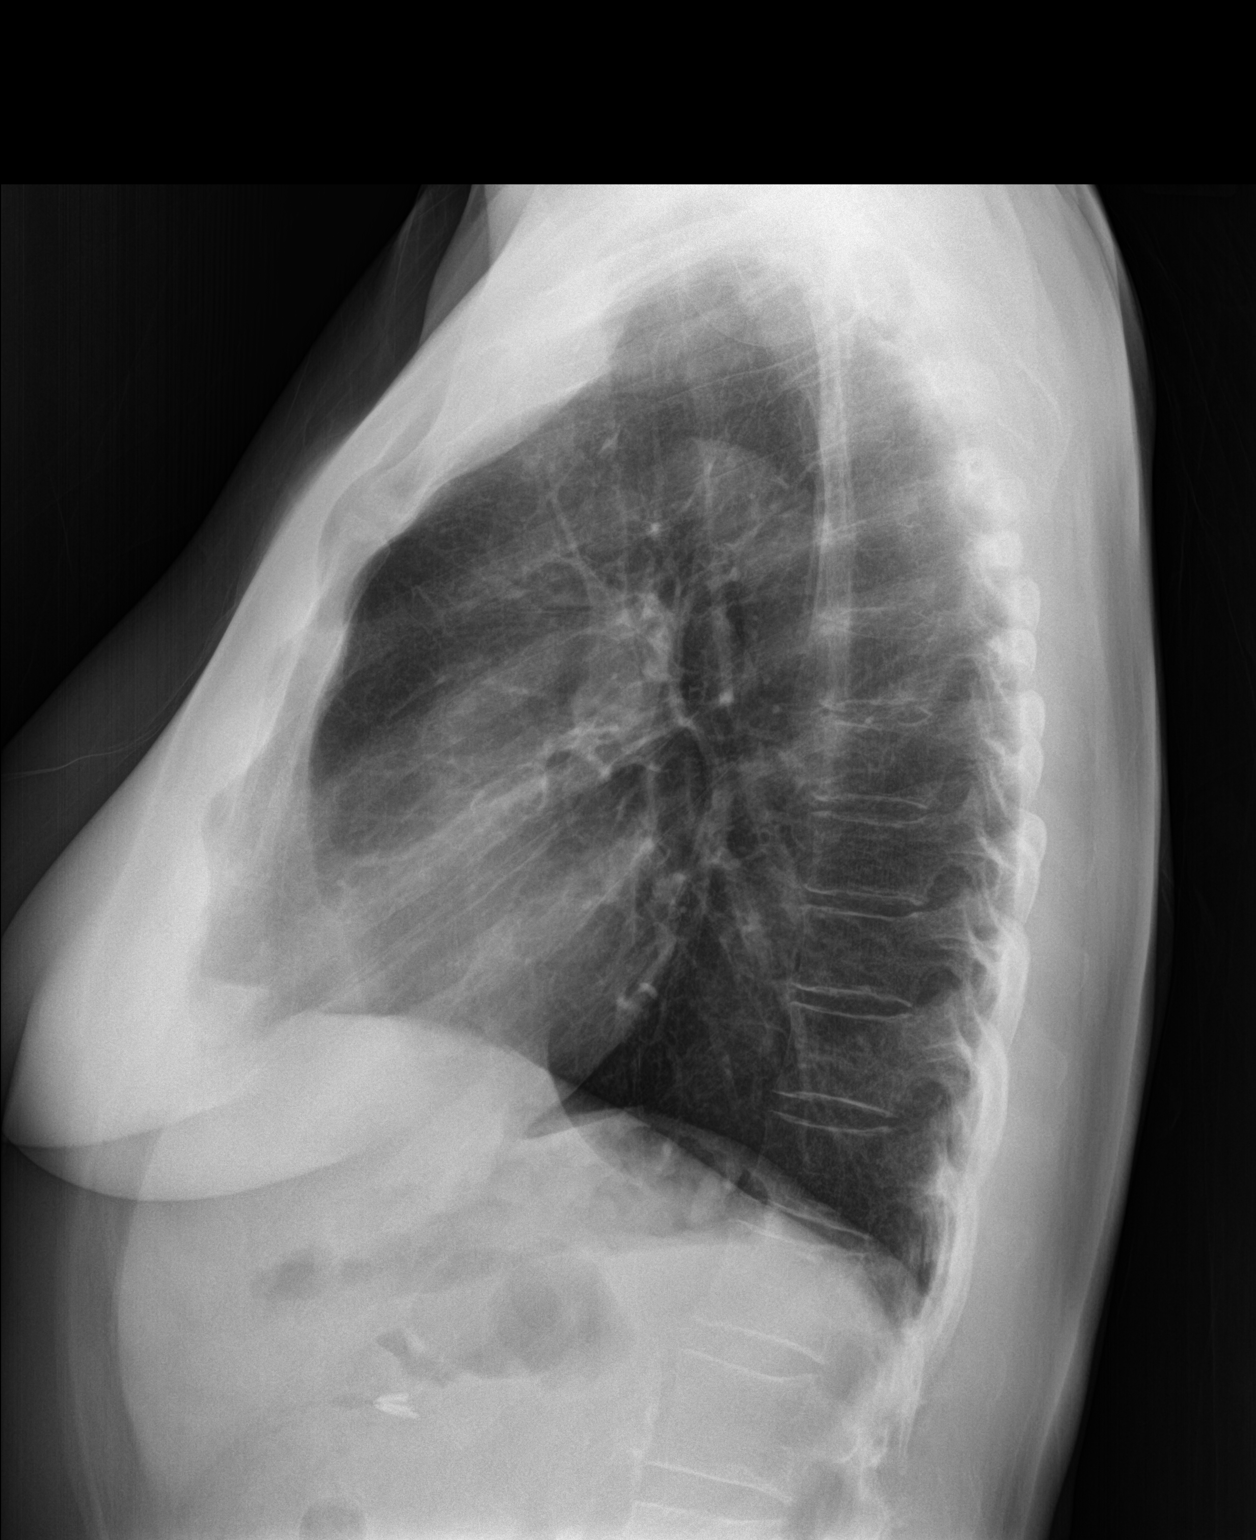

[2 of 2 positions shown; findings below may reference images not displayed]

FINDINGS: Mediastinum hilar structures normal. Low lung volumes with mild
bibasilar atelectasis. No pleural effusion or pneumothorax.
Degenerative changes thoracic spine with mild scoliosis .
IMPRESSION: No acute cardiopulmonary disease.

## 2018-09-29 ENCOUNTER — Telehealth: Payer: Self-pay | Admitting: *Deleted

## 2018-09-29 DIAGNOSIS — Z780 Asymptomatic menopausal state: Secondary | ICD-10-CM

## 2018-09-29 DIAGNOSIS — E2839 Other primary ovarian failure: Secondary | ICD-10-CM

## 2018-09-29 DIAGNOSIS — R634 Abnormal weight loss: Secondary | ICD-10-CM

## 2018-09-29 NOTE — Telephone Encounter (Signed)
Patient called and stated that she needs an order faxed to [facility] in [place] for her to get the CT Scan you recommended. Stated to be sure you put on order with or without contrast. Please Advise.    Needs it faxed to [phone] Attn: Scheduling

## 2018-09-29 NOTE — Telephone Encounter (Signed)
Forwarded to Lisa

## 2018-09-29 NOTE — Telephone Encounter (Signed)
External referrals entered for [place]--they should have printed off--for CT chest and for bone density patient also agreed to get done in Jan.  Please give these to [facility]  Thanks.

## 2018-09-30 ENCOUNTER — Telehealth: Payer: Self-pay | Admitting: Internal Medicine

## 2018-09-30 NOTE — Telephone Encounter (Signed)
I spoke with pt to get updated insurance info. I have also faxed Bone density & Chest CT referral to [facility] scheduling dept in [place].  Pt is aware that radiology dept in [place] will contact her to make appt.  Thanks, Vilinda Blanks

## 2018-10-04 ENCOUNTER — Telehealth: Payer: Self-pay | Admitting: *Deleted

## 2018-10-04 DIAGNOSIS — R634 Abnormal weight loss: Secondary | ICD-10-CM

## 2018-10-04 NOTE — Telephone Encounter (Signed)
Patient called and stated that [facility] received her order for a CT Chest without contrast but they need the order for Abdomen and Pelvis with or without contrast faxed to them. Fax: [phone]. Please Advise.

## 2018-10-11 DIAGNOSIS — L570 Actinic keratosis: Secondary | ICD-10-CM | POA: Diagnosis not present

## 2018-10-11 DIAGNOSIS — L309 Dermatitis, unspecified: Secondary | ICD-10-CM | POA: Diagnosis not present

## 2018-10-15 DIAGNOSIS — R1011 Right upper quadrant pain: Secondary | ICD-10-CM | POA: Diagnosis not present

## 2018-10-15 DIAGNOSIS — R634 Abnormal weight loss: Secondary | ICD-10-CM | POA: Diagnosis not present

## 2018-10-15 DIAGNOSIS — J439 Emphysema, unspecified: Secondary | ICD-10-CM | POA: Diagnosis not present

## 2018-10-15 DIAGNOSIS — G8929 Other chronic pain: Secondary | ICD-10-CM | POA: Diagnosis not present

## 2018-10-18 DIAGNOSIS — R1011 Right upper quadrant pain: Secondary | ICD-10-CM | POA: Diagnosis not present

## 2018-10-18 DIAGNOSIS — G8929 Other chronic pain: Secondary | ICD-10-CM | POA: Diagnosis not present

## 2018-10-18 DIAGNOSIS — R634 Abnormal weight loss: Secondary | ICD-10-CM | POA: Diagnosis not present

## 2018-10-19 ENCOUNTER — Other Ambulatory Visit: Payer: Self-pay | Admitting: Internal Medicine

## 2018-10-19 DIAGNOSIS — F411 Generalized anxiety disorder: Secondary | ICD-10-CM

## 2018-10-19 DIAGNOSIS — F439 Reaction to severe stress, unspecified: Secondary | ICD-10-CM

## 2018-10-19 DIAGNOSIS — R1011 Right upper quadrant pain: Principal | ICD-10-CM

## 2018-10-19 DIAGNOSIS — G8929 Other chronic pain: Secondary | ICD-10-CM

## 2018-10-19 DIAGNOSIS — F5101 Primary insomnia: Secondary | ICD-10-CM

## 2018-10-19 NOTE — Telephone Encounter (Signed)
Refill request from [place], Database checked and verified Pt is 3 days early  Also pt is not longer taking lisinopril due to hypotension, so phone message 06/07/18.

## 2018-10-22 ENCOUNTER — Telehealth: Payer: Self-pay | Admitting: *Deleted

## 2018-10-22 NOTE — Telephone Encounter (Signed)
Results from CT abdomen and pelvis with IV contast from [facility] in [place], [place]:  1- tiny right kidney stone vs. Calcified piece in artery 2- plaque build-up in aorta iliac arteries due to smoking and high cholesterol food choices. 3. No explanation for weight loss

## 2018-10-22 NOTE — Telephone Encounter (Signed)
.  left message to have patient return my call.  

## 2018-10-25 DIAGNOSIS — Z780 Asymptomatic menopausal state: Secondary | ICD-10-CM | POA: Diagnosis not present

## 2018-10-25 DIAGNOSIS — M8589 Other specified disorders of bone density and structure, multiple sites: Secondary | ICD-10-CM | POA: Diagnosis not present

## 2018-10-25 DIAGNOSIS — E2839 Other primary ovarian failure: Secondary | ICD-10-CM | POA: Diagnosis not present

## 2018-10-25 LAB — HM DEXA SCAN

## 2018-10-25 NOTE — Telephone Encounter (Signed)
.  left message to have patient return my call.  

## 2018-10-26 NOTE — Telephone Encounter (Signed)
Patient notified and agreed.  

## 2018-10-27 ENCOUNTER — Encounter: Payer: Self-pay | Admitting: *Deleted

## 2018-10-28 ENCOUNTER — Other Ambulatory Visit: Payer: Self-pay | Admitting: Internal Medicine

## 2018-10-29 ENCOUNTER — Telehealth: Payer: Self-pay | Admitting: *Deleted

## 2018-10-29 NOTE — Telephone Encounter (Signed)
Calling patient with results:  CT CHEST WITHOUT IV CONTRAST: no long mass, mild emphysema + calcification of the arteries  DEXA SCAN: she has osteopenia, low bone density, smokking cessation would help this most. She should do weight bearing exercise and take at lease 2000 units of vitamin d3 daily

## 2018-10-29 NOTE — Telephone Encounter (Signed)
Spoke with patient and advised results   

## 2018-11-08 ENCOUNTER — Other Ambulatory Visit: Payer: Self-pay

## 2018-11-08 ENCOUNTER — Ambulatory Visit: Payer: Medicare Other | Admitting: Internal Medicine

## 2018-11-08 ENCOUNTER — Encounter: Payer: Self-pay | Admitting: Internal Medicine

## 2018-11-08 VITALS — BP 120/80 | Temp 98.3°F | Ht 65.0 in | Wt 120.0 lb

## 2018-11-08 DIAGNOSIS — R55 Syncope and collapse: Secondary | ICD-10-CM | POA: Diagnosis not present

## 2018-11-08 DIAGNOSIS — R739 Hyperglycemia, unspecified: Secondary | ICD-10-CM

## 2018-11-08 DIAGNOSIS — F411 Generalized anxiety disorder: Secondary | ICD-10-CM | POA: Diagnosis not present

## 2018-11-08 DIAGNOSIS — M8589 Other specified disorders of bone density and structure, multiple sites: Secondary | ICD-10-CM | POA: Diagnosis not present

## 2018-11-08 DIAGNOSIS — F17200 Nicotine dependence, unspecified, uncomplicated: Secondary | ICD-10-CM

## 2018-11-08 DIAGNOSIS — G8929 Other chronic pain: Secondary | ICD-10-CM

## 2018-11-08 DIAGNOSIS — R634 Abnormal weight loss: Secondary | ICD-10-CM | POA: Diagnosis not present

## 2018-11-08 DIAGNOSIS — E785 Hyperlipidemia, unspecified: Secondary | ICD-10-CM | POA: Diagnosis not present

## 2018-11-08 DIAGNOSIS — B009 Herpesviral infection, unspecified: Secondary | ICD-10-CM

## 2018-11-08 DIAGNOSIS — Z79899 Other long term (current) drug therapy: Secondary | ICD-10-CM

## 2018-11-08 DIAGNOSIS — R1011 Right upper quadrant pain: Secondary | ICD-10-CM | POA: Diagnosis not present

## 2018-11-08 MED ORDER — VALACYCLOVIR HCL 500 MG PO TABS: 500.0000 mg | ORAL_TABLET | Freq: Two times a day (BID) | ORAL | 1 refills | Status: DC

## 2018-11-08 NOTE — Progress Notes (Signed)
Location:  [facility] clinic Provider:  Hideko Esselman L. Mariea Clonts, D.O., C.M.D.  Goals of Care:  Advanced Directives 02/22/2018  Does Patient Have a Medical Advance Directive? No  Would patient like information on creating a medical advance directive? No - Patient declined   Chief Complaint  Patient presents with   Medical Management of Chronic Issues    70mth follow-up    HPI: Patient is a 63 y.o. female seen today for medical management of chronic diseases.    She's been passing out a little here and there.  Happened christmas eve night and then a couple weeks ago.  Has to crawl to the bathroom.  Father has to pick her up when she does it.  She's eating a lot of fruit and peanut butter.  She cannot eat with her parents or they make demands on her.  She had been down to 113 but she's been 116.  She ate and did normal things over the weekend--relaxed and watched tv here in [place].    No palpitations, chest pain, sob.  She stands up, gets dizzy, walks a little.  Lips get numb, face turns white and she's on her back.  Not daily, not weekly, but does happen and did hit her head once.  She had her cscope which showed precancerous polyps.   She is cutting down on smoking.  Her dad is taking her cigarettes.  They are more expensive in [place].    She had osteopenia on her bone density.  She is walking for weightbearing exercise.    She says she has not drank in over a week.  Says she's going to stop that, too.  She only drank two drinks.  Wine has started to give her indigestion.  Drinking iced tea, diet coke and ginger ale.    Last abdominal pain/vomiting episode was three weeks ago.  Nothing seen on CT abd/pelvis to explain it.  Was sick almost all day.  Did have kidney stone on CT, but nonobstructive.  Insurance will only cover for a limited number of ativan.    Needs labs--not done in a year due to cost.  Needs drug screen.   Past Medical History:  Diagnosis Date   Abdominal pain 2000   chronic  abdominal pain with nausea and vomiting   Abnormal liver function    Allergy    Hypertension     Past Surgical History:  Procedure Laterality Date   ABDOMINAL HYSTERECTOMY     CHOLECYSTECTOMY      Allergies  Allergen Reactions   Avelox [Moxifloxacin Hcl In Nacl]     Low blood pressure   Compazine [Prochlorperazine Edisylate] Other (See Comments)    Caused lockjaw   Ibuprofen Hives   Morphine And Related Nausea And Vomiting   Promethazine Hcl Nausea And Vomiting    Outpatient Encounter Medications as of 11/08/2018  Medication Sig   Cholecalciferol (VITAMIN D) 2000 UNITS CAPS Take 1 capsule (2,000 Units total) by mouth daily.   escitalopram (LEXAPRO) 20 MG tablet take 1 tablet by mouth once daily   famotidine (PEPCID) 20 MG tablet Take 20 mg by mouth 2 (two) times daily.    lisinopril (PRINIVIL,ZESTRIL) 20 MG tablet take 1 tablet by mouth once daily   LORazepam (ATIVAN) 0.5 MG tablet take 1 tablet by mouth every 6 hours if needed   Oxycodone HCl 10 MG TABS take 1 tablet by mouth every 6 hours if needed for pain   simvastatin (ZOCOR) 20 MG tablet take 1  tablet by mouth at bedtime   valACYclovir (VALTREX) 500 MG tablet Take 1 tablet (500 mg total) by mouth 2 (two) times daily.   zolpidem (AMBIEN) 10 MG tablet take 1 tablet by mouth at bedtime for sleep   [DISCONTINUED] valACYclovir (VALTREX) 500 MG tablet Take 500 mg by mouth 2 (two) times daily.   No facility-administered encounter medications on file as of 11/08/2018.     Review of Systems:  Review of Systems  Constitutional: Negative for chills, fever and malaise/fatigue.  HENT: Negative for hearing loss.   Eyes: Negative for blurred vision.  Respiratory: Negative for cough and shortness of breath.   Cardiovascular: Negative for chest pain, palpitations and leg swelling.  Gastrointestinal: Positive for abdominal pain, heartburn, nausea and vomiting. Negative for blood in stool, constipation, diarrhea and  melena.  Genitourinary: Negative for dysuria.  Musculoskeletal: Negative for falls, joint pain and myalgias.  Skin: Negative for itching and rash.  Neurological: Positive for dizziness and loss of consciousness. Negative for tingling, sensory change (lips go numb before syncope), focal weakness, seizures and weakness.  Psychiatric/Behavioral: Positive for depression. Negative for memory loss and suicidal ideas. The patient is nervous/anxious and has insomnia.     Health Maintenance  Topic Date Due   PNA vac Low Risk Adult (2 of 2 - PPSV23) 09/19/2018   PAP SMEAR-Modifier  10/10/2018   MAMMOGRAM  04/12/2020   TETANUS/TDAP  09/27/2025   COLONOSCOPY  06/29/2028   INFLUENZA VACCINE  Completed   DEXA SCAN  Completed   Hepatitis C Screening  Completed   HIV Screening  Completed    Physical Exam: Vitals:   11/08/18 1023  BP: 120/80  Temp: 98.3 F (36.8 C)  TempSrc: Oral  Weight: 120 lb (54.4 kg)  Height: 5\' 5"  (1.651 m)   Body mass index is 19.97 kg/m. Physical Exam Vitals signs reviewed.  Constitutional:      Appearance: Normal appearance.     Comments: Thin female  HENT:     Head: Normocephalic and atraumatic.  Cardiovascular:     Rate and Rhythm: Normal rate and regular rhythm.     Pulses: Normal pulses.     Heart sounds: Normal heart sounds.  Pulmonary:     Effort: Pulmonary effort is normal.     Breath sounds: Normal breath sounds. No wheezing or rhonchi.  Abdominal:     General: Abdomen is flat. Bowel sounds are normal. There is no distension.     Palpations: There is no mass.     Tenderness: There is abdominal tenderness.     Comments: RUQ chronically tender  Musculoskeletal: Normal range of motion.        General: No swelling, tenderness or deformity.     Right lower leg: No edema.     Left lower leg: No edema.  Skin:    General: Skin is warm and dry.     Capillary Refill: Capillary refill takes less than 2 seconds.  Neurological:     General: No  focal deficit present.     Mental Status: She is alert and oriented to person, place, and time.     Cranial Nerves: No cranial nerve deficit.     Sensory: No sensory deficit.     Motor: No weakness.     Coordination: Coordination normal.     Gait: Gait normal.     Deep Tendon Reflexes: Reflexes normal.  Psychiatric:        Mood and Affect: Mood normal.  Behavior: Behavior normal.        Thought Content: Thought content normal.        Judgment: Judgment normal.     Labs reviewed: Basic Metabolic Panel: Recent Labs    11/09/17 1507  NA 141  K 3.1*  CL 105  CO2 29  GLUCOSE 96  BUN 7  CREATININE 0.61  CALCIUM 9.0   Liver Function Tests: Recent Labs    11/09/17 1507  AST 32  ALT 49*  BILITOT 0.6  PROT 6.1   No results for input(s): LIPASE, AMYLASE in the last 8760 hours. No results for input(s): AMMONIA in the last 8760 hours. CBC: No results for input(s): WBC, NEUTROABS, HGB, HCT, MCV, PLT in the last 8760 hours. Lipid Panel: No results for input(s): CHOL, HDL, LDLCALC, TRIG, CHOLHDL, LDLDIRECT in the last 8760 hours. Lab Results  Component Value Date   HGBA1C 5.1 02/09/2017    Procedures since last visit: No results found.  Assessment/Plan 1. Osteopenia of multiple sites -counseled on weightbearing exercise, vitamin D3 supplementation and smoking and alcohol cessation to help with this based on bone density   2. Herpes simplex - valACYclovir (VALTREX) 500 MG tablet; Take 1 tablet (500 mg total) by mouth 2 (two) times daily.  Dispense: 60 tablet; Refill: 1  3. Abnormal weight loss - continues to drop weight due to poor po intake it seems as imaging has not shown any cause - CBC with Differential/Platelet - Basic metabolic panel - Hepatic function panel  4. Tobacco use disorder -ongoing, continues to smoke though less than previously  5. Anxiety state - continues on lorazepam but does not fill monthly due to irregular use--has a lot of stress as  caregiver for both parents in [place] - Pain Mgmt, Profile 6 w/Conf, U  6. High risk medication use - due for drug screen--will do one that checks for alcohol also - Pain Mgmt, Profile 6 w/Conf, U  7. Chronic RUQ pain - continues to get episodes with nausea and vomiting, CT abd/pelvis unrevealing for cause--has been happening since her chole years ago and worked up by GI multiple places w/o explanation - Hepatic function panel - Pain Mgmt, Profile 6 w/Conf, U  8. Syncope, unspecified syncope type -concerning -my first suspicion was hypoglycemia with her poor intake -is not using benzo much -no change to oxycodone in a long time - EKG 12-Lead done today--sinus w/o explanation for syncope--suspect hypoglycemia from not eating -educated on healthier eating plan--does not have abnormal neurologic findings to make me concerned about needing CT head  9. Hyperglycemia - check lab to make sure not hypoglycemic - Hemoglobin A1c  10. Hyperlipidemia, unspecified hyperlipidemia type -cont statin therapy due to poor diet and f/u lab - Lipid panel  Labs/tests ordered:   Orders Placed This Encounter  Procedures   CBC with Differential/Platelet   Basic metabolic panel    Order Specific Question:   Has the patient fasted?    Answer:   Yes   Hepatic function panel   Hemoglobin A1c   Lipid panel    Order Specific Question:   Has the patient fasted?    Answer:   Yes   Drug Tox Alc Metab w/Con, Oral Fld   EKG 12-Lead    Next appt:  4 mos med mgt   Breccan Galant L. Johnpaul Gillentine, D.O. [facility] [address], [place] [address] Cell Phone (Mon-Fri 8am-5pm):  [phone] On Call:  [phone] & follow prompts  after 5pm & weekends Office Phone:  [phone] Office Fax:  [phone]

## 2018-11-09 LAB — LIPID PANEL
Cholesterol: 198 mg/dL (ref <200)
HDL: 53 mg/dL (ref >=50)
LDL Cholesterol (Calc): 118 mg/dL (calc) — ABNORMAL HIGH
Non-HDL Cholesterol (Calc): 145 mg/dL (calc) — ABNORMAL HIGH (ref <130)
Total CHOL/HDL Ratio: 3.7 (calc) (ref <5.0)
Triglycerides: 153 mg/dL — ABNORMAL HIGH (ref <150)

## 2018-11-09 LAB — HEPATIC FUNCTION PANEL
AG Ratio: 2 (calc) (ref 1.0–2.5)
ALT: 11 U/L (ref 6–29)
AST: 12 U/L (ref 10–35)
Albumin: 4.2 g/dL (ref 3.6–5.1)
Alkaline phosphatase (APISO): 103 U/L (ref 37–153)
Bilirubin, Direct: 0.1 mg/dL (ref 0.0–0.2)
Globulin: 2.1 g/dL (calc) (ref 1.9–3.7)
Indirect Bilirubin: 0.2 mg/dL (calc) (ref 0.2–1.2)
Total Bilirubin: 0.3 mg/dL (ref 0.2–1.2)
Total Protein: 6.3 g/dL (ref 6.1–8.1)

## 2018-11-09 LAB — BASIC METABOLIC PANEL
BUN: 15 mg/dL (ref 7–25)
CO2: 29 mmol/L (ref 20–32)
Calcium: 9.9 mg/dL (ref 8.6–10.4)
Chloride: 103 mmol/L (ref 98–110)
Creat: 0.67 mg/dL (ref 0.50–0.99)
Glucose, Bld: 96 mg/dL (ref 65–99)
Potassium: 4.6 mmol/L (ref 3.5–5.3)
Sodium: 139 mmol/L (ref 135–146)

## 2018-11-09 LAB — CBC WITH DIFFERENTIAL/PLATELET
Absolute Monocytes: 356 cells/uL (ref 200–950)
Basophils Absolute: 59 cells/uL (ref 0–200)
Basophils Relative: 0.9 %
Eosinophils Absolute: 59 cells/uL (ref 15–500)
Eosinophils Relative: 0.9 %
HCT: 39.8 % (ref 35.0–45.0)
Hemoglobin: 13.3 g/dL (ref 11.7–15.5)
Lymphs Abs: 2270 cells/uL (ref 850–3900)
MCH: 32.3 pg (ref 27.0–33.0)
MCHC: 33.4 g/dL (ref 32.0–36.0)
MCV: 96.6 fL (ref 80.0–100.0)
MPV: 9.9 fL (ref 7.5–12.5)
Monocytes Relative: 5.4 %
Neutro Abs: 3854 cells/uL (ref 1500–7800)
Neutrophils Relative %: 58.4 %
Platelets: 415 10*3/uL — ABNORMAL HIGH (ref 140–400)
RBC: 4.12 10*6/uL (ref 3.80–5.10)
RDW: 13.1 % (ref 11.0–15.0)
Total Lymphocyte: 34.4 %
WBC: 6.6 10*3/uL (ref 3.8–10.8)

## 2018-11-09 LAB — HEMOGLOBIN A1C
Hgb A1c MFr Bld: 5.4 % of total Hgb (ref <5.7)
Mean Plasma Glucose: 108 (calc)
eAG (mmol/L): 6 (calc)

## 2018-11-11 ENCOUNTER — Encounter: Payer: Self-pay | Admitting: *Deleted

## 2018-11-12 LAB — DRUG TOX ALC METAB W/CON, ORAL FLD: Alcohol Metabolite: NEGATIVE ng/mL (ref <25)

## 2018-11-16 ENCOUNTER — Other Ambulatory Visit: Payer: Self-pay | Admitting: *Deleted

## 2018-11-16 DIAGNOSIS — F5101 Primary insomnia: Secondary | ICD-10-CM

## 2018-11-16 DIAGNOSIS — F411 Generalized anxiety disorder: Secondary | ICD-10-CM

## 2018-11-16 DIAGNOSIS — G8929 Other chronic pain: Secondary | ICD-10-CM

## 2018-11-16 DIAGNOSIS — R1011 Right upper quadrant pain: Secondary | ICD-10-CM

## 2018-11-16 MED ORDER — OXYCODONE HCL 10 MG PO TABS: ORAL_TABLET | ORAL | 0 refills | Status: DC

## 2018-11-16 MED ORDER — LORAZEPAM 0.5 MG PO TABS: ORAL_TABLET | ORAL | 0 refills | Status: DC

## 2018-11-16 MED ORDER — ZOLPIDEM TARTRATE 10 MG PO TABS: ORAL_TABLET | ORAL | 0 refills | Status: DC

## 2018-11-16 NOTE — Telephone Encounter (Signed)
Patient requested refills.  [place] Verified LR: 10/19/2018 Pended Rx's and sent to Dr. Mariea Clonts for approval.

## 2018-12-14 ENCOUNTER — Other Ambulatory Visit: Payer: Self-pay | Admitting: Internal Medicine

## 2018-12-14 DIAGNOSIS — R1011 Right upper quadrant pain: Principal | ICD-10-CM

## 2018-12-14 DIAGNOSIS — G8929 Other chronic pain: Secondary | ICD-10-CM

## 2018-12-14 DIAGNOSIS — F5101 Primary insomnia: Secondary | ICD-10-CM

## 2018-12-14 NOTE — Telephone Encounter (Signed)
Patient requested refill [place] Verified LR: 11/16/2018 Pended Rx and sent to Dr. Mariea Clonts for approval.

## 2018-12-15 ENCOUNTER — Other Ambulatory Visit: Payer: Self-pay | Admitting: Internal Medicine

## 2018-12-15 DIAGNOSIS — F411 Generalized anxiety disorder: Secondary | ICD-10-CM

## 2018-12-15 NOTE — Telephone Encounter (Signed)
Last filled 11/16/2018 Database checked and verified

## 2018-12-19 ENCOUNTER — Encounter: Payer: Self-pay | Admitting: Internal Medicine

## 2019-01-10 ENCOUNTER — Telehealth: Payer: Self-pay | Admitting: *Deleted

## 2019-01-10 DIAGNOSIS — G8929 Other chronic pain: Secondary | ICD-10-CM

## 2019-01-10 MED ORDER — FAMOTIDINE 40 MG PO TABS: 40.0000 mg | ORAL_TABLET | Freq: Every day | ORAL | 0 refills | Status: DC

## 2019-01-10 NOTE — Telephone Encounter (Signed)
Medication list updated.  Pended Rx and sent to Dr. Reed for approval due to HIGH ALERT Warning. 

## 2019-01-10 NOTE — Telephone Encounter (Signed)
That's fine

## 2019-01-10 NOTE — Telephone Encounter (Signed)
Patient called and wanted to know if we could change her Famotidine to 40 mg instead of 20mg  because 40mg  is all the pharmacy has available. Please Advise.

## 2019-01-11 ENCOUNTER — Other Ambulatory Visit: Payer: Self-pay | Admitting: Internal Medicine

## 2019-01-11 DIAGNOSIS — F411 Generalized anxiety disorder: Secondary | ICD-10-CM

## 2019-01-11 DIAGNOSIS — F5101 Primary insomnia: Secondary | ICD-10-CM

## 2019-01-11 DIAGNOSIS — G8929 Other chronic pain: Secondary | ICD-10-CM

## 2019-01-11 NOTE — Telephone Encounter (Signed)
Last filled 12/15/2018. 3 days early Database checked and verified

## 2019-01-24 DIAGNOSIS — R239 Unspecified skin changes: Secondary | ICD-10-CM | POA: Diagnosis not present

## 2019-01-24 DIAGNOSIS — L309 Dermatitis, unspecified: Secondary | ICD-10-CM | POA: Diagnosis not present

## 2019-01-24 DIAGNOSIS — D485 Neoplasm of uncertain behavior of skin: Secondary | ICD-10-CM | POA: Diagnosis not present

## 2019-02-03 ENCOUNTER — Encounter: Payer: Medicare Other | Admitting: Internal Medicine

## 2019-02-03 ENCOUNTER — Other Ambulatory Visit: Payer: Self-pay

## 2019-02-03 NOTE — Progress Notes (Signed)
Patient ID: Theresa Carey, female   DOB: 07/14/1956, 63 y.o.   MRN: [id number]   Pt was scheduled for telephone visit and we attentempted to call her 3 times, 4:14, 4:23, 4:40, pt will have to reschedule.

## 2019-02-07 DIAGNOSIS — L309 Dermatitis, unspecified: Secondary | ICD-10-CM | POA: Diagnosis not present

## 2019-02-07 DIAGNOSIS — L820 Inflamed seborrheic keratosis: Secondary | ICD-10-CM | POA: Diagnosis not present

## 2019-02-07 DIAGNOSIS — D485 Neoplasm of uncertain behavior of skin: Secondary | ICD-10-CM | POA: Diagnosis not present

## 2019-02-07 DIAGNOSIS — R200 Anesthesia of skin: Secondary | ICD-10-CM | POA: Diagnosis not present

## 2019-02-08 ENCOUNTER — Other Ambulatory Visit: Payer: Self-pay | Admitting: Internal Medicine

## 2019-02-08 DIAGNOSIS — F5101 Primary insomnia: Secondary | ICD-10-CM

## 2019-02-08 DIAGNOSIS — G8929 Other chronic pain: Secondary | ICD-10-CM

## 2019-02-08 DIAGNOSIS — F411 Generalized anxiety disorder: Secondary | ICD-10-CM

## 2019-02-08 DIAGNOSIS — R1011 Right upper quadrant pain: Secondary | ICD-10-CM

## 2019-02-08 NOTE — Telephone Encounter (Signed)
Database checked and verified, Last filled 01/11/2019 and 01/14/2019

## 2019-02-17 ENCOUNTER — Telehealth: Payer: Self-pay

## 2019-02-17 NOTE — Telephone Encounter (Signed)
Per verbal from Dr. Sheppard Coil, pt would need to be seen in [place] at Urgent care

## 2019-02-17 NOTE — Telephone Encounter (Addendum)
Patient called because she fell on Saturday 02/12/2019 she says she is having a lot of pain  and swelling of her  Right upper shoulder and right lower back  And wants  an order for an x-ray but she has not been seen by any provider   she is in [place]. The fax number  is [phone] please advise

## 2019-02-17 NOTE — Telephone Encounter (Signed)
Spoke with patient and advised results   

## 2019-03-02 ENCOUNTER — Other Ambulatory Visit: Payer: Self-pay | Admitting: Internal Medicine

## 2019-03-02 DIAGNOSIS — G8929 Other chronic pain: Secondary | ICD-10-CM

## 2019-03-03 ENCOUNTER — Other Ambulatory Visit: Payer: Self-pay | Admitting: Internal Medicine

## 2019-03-03 DIAGNOSIS — F439 Reaction to severe stress, unspecified: Secondary | ICD-10-CM

## 2019-03-07 ENCOUNTER — Other Ambulatory Visit: Payer: Self-pay

## 2019-03-07 ENCOUNTER — Encounter: Payer: Self-pay | Admitting: Internal Medicine

## 2019-03-07 ENCOUNTER — Ambulatory Visit (INDEPENDENT_AMBULATORY_CARE_PROVIDER_SITE_OTHER): Payer: Medicare Other | Admitting: Internal Medicine

## 2019-03-07 DIAGNOSIS — M79601 Pain in right arm: Secondary | ICD-10-CM

## 2019-03-07 DIAGNOSIS — E785 Hyperlipidemia, unspecified: Secondary | ICD-10-CM | POA: Diagnosis not present

## 2019-03-07 DIAGNOSIS — R1011 Right upper quadrant pain: Secondary | ICD-10-CM

## 2019-03-07 DIAGNOSIS — F17200 Nicotine dependence, unspecified, uncomplicated: Secondary | ICD-10-CM | POA: Diagnosis not present

## 2019-03-07 DIAGNOSIS — R739 Hyperglycemia, unspecified: Secondary | ICD-10-CM | POA: Diagnosis not present

## 2019-03-07 DIAGNOSIS — G8929 Other chronic pain: Secondary | ICD-10-CM

## 2019-03-07 DIAGNOSIS — W19XXXA Unspecified fall, initial encounter: Secondary | ICD-10-CM

## 2019-03-07 DIAGNOSIS — M8589 Other specified disorders of bone density and structure, multiple sites: Secondary | ICD-10-CM | POA: Diagnosis not present

## 2019-03-07 DIAGNOSIS — M25511 Pain in right shoulder: Secondary | ICD-10-CM

## 2019-03-07 DIAGNOSIS — Z79899 Other long term (current) drug therapy: Secondary | ICD-10-CM | POA: Diagnosis not present

## 2019-03-07 NOTE — Progress Notes (Signed)
Patient ID: Theresa Carey, female   DOB: [birth year], 63 y.o.   MRN: [id number] This service is provided via telemedicine  No vital signs collected/recorded due to the encounter was a telemedicine visit.   Location of patient (ex: home, work):  HOME  Patient consents to a telephone visit:  YES  Location of the provider (ex: office, home):  OFFICE  Name of any referring provider:  Sender Rueb, DO  Names of all persons participating in the telemedicine service and their role in the encounter:  PATIENT, [place], [place], DO  Time spent on call:  4:05    Provider:  Arieana Somoza L. Mariea Clonts, D.O., C.M.D.  Goals of Care:  Advanced Directives 02/22/2018  Does Patient Have a Medical Advance Directive? No  Would patient like information on creating a medical advance directive? No - Patient declined   Chief Complaint  Patient presents with  . Medical Management of Chronic Issues    follow-up    HPI: Patient is a 63 y.o. female seen today for medical management of chronic diseases.    She's stressed out with taking care of her parents.    She was sick with a temp of 104, went down to 106 lbs but back up to 116 lbs.  She had her abdominal pain and vomiting for 24 hrs.  She then drank pedialyte to recover.  She did not get evaluated b/c she cannot leave her parents and got better in a day.  She does not have a good appetite which is not new.  She eats small amounts.  She says she's not anorexic.  She fell on her back when half asleep.  She never falls backwards until now.  She did not lose consciousness.  Denies intoxication.  She is not drinking as much--it does not make her feel any better or worse.  She is still smoking cigarettes.    Past Medical History:  Diagnosis Date  . Abdominal pain 2000   chronic abdominal pain with nausea and vomiting  . Abnormal liver function   . Allergy   . Hypertension     Past Surgical History:  Procedure Laterality Date  . ABDOMINAL  HYSTERECTOMY    . CHOLECYSTECTOMY      Allergies  Allergen Reactions  . Avelox [Moxifloxacin Hcl In Nacl]     Low blood pressure  . Compazine [Prochlorperazine Edisylate] Other (See Comments)    Caused lockjaw  . Ibuprofen Hives  . Morphine And Related Nausea And Vomiting  . Promethazine Hcl Nausea And Vomiting    Outpatient Encounter Medications as of 03/07/2019  Medication Sig  . Cholecalciferol (VITAMIN D) 2000 UNITS CAPS Take 1 capsule (2,000 Units total) by mouth daily.  Marland Kitchen escitalopram (LEXAPRO) 20 MG tablet take 1 tablet by mouth once daily  . famotidine (PEPCID) 40 MG tablet take 1 tablet by mouth once daily  . lisinopril (PRINIVIL,ZESTRIL) 20 MG tablet take 1 tablet by mouth once daily  . LORazepam (ATIVAN) 0.5 MG tablet take 1 tablet by mouth every 6 hours if needed  . Oxycodone HCl 10 MG TABS take 1 tablet by mouth every 6 hours if needed for pain  . simvastatin (ZOCOR) 20 MG tablet take 1 tablet by mouth at bedtime  . valACYclovir (VALTREX) 500 MG tablet Take 1 tablet (500 mg total) by mouth 2 (two) times daily.  Marland Kitchen zolpidem (AMBIEN) 10 MG tablet take 1 tablet by mouth at bedtime for sleep   No facility-administered encounter medications on file as  of 03/07/2019.     Review of Systems:  Review of Systems  Constitutional: Positive for weight loss. Negative for chills, fever and malaise/fatigue.  HENT: Negative for hearing loss.   Eyes: Negative for blurred vision.  Respiratory: Negative for cough, shortness of breath and wheezing.   Cardiovascular: Negative for chest pain, palpitations and leg swelling.  Gastrointestinal: Positive for abdominal pain (chronic intermittent RUQ since chole). Negative for constipation and diarrhea.  Genitourinary: Negative for dysuria.  Musculoskeletal: Positive for falls and joint pain. Negative for myalgias.  Neurological: Positive for focal weakness. Negative for loss of consciousness and headaches.  Psychiatric/Behavioral: Negative for  depression and memory loss. The patient is nervous/anxious.        Caregiver stress    Health Maintenance  Topic Date Due  . PNA vac Low Risk Adult (2 of 2 - PPSV23) 09/19/2018  . INFLUENZA VACCINE  03/26/2019  . MAMMOGRAM  04/12/2020  . COLONOSCOPY  06/30/2023  . TETANUS/TDAP  09/27/2025  . DEXA SCAN  Completed  . Hepatitis C Screening  Completed  . HIV Screening  Completed    Physical Exam: Could not be performed as visit non face-to-face via phone   Labs reviewed: Basic Metabolic Panel: Recent Labs    11/08/18 1117  NA 139  K 4.6  CL 103  CO2 29  GLUCOSE 96  BUN 15  CREATININE 0.67  CALCIUM 9.9   Liver Function Tests: Recent Labs    11/08/18 1117  AST 12  ALT 11  BILITOT 0.3  PROT 6.3   No results for input(s): LIPASE, AMYLASE in the last 8760 hours. No results for input(s): AMMONIA in the last 8760 hours. CBC: Recent Labs    11/08/18 1117  WBC 6.6  NEUTROABS 3,854  HGB 13.3  HCT 39.8  MCV 96.6  PLT 415*   Lipid Panel: Recent Labs    11/08/18 1117  CHOL 198  HDL 53  LDLCALC 118*  TRIG 153*  CHOLHDL 3.7   Lab Results  Component Value Date   HGBA1C 5.4 11/08/2018    Procedures since last visit: No results found.  Assessment/Plan 1. Fall, initial encounter -at home in [place]--requests that orders for xrays be faxed to her imaging center in [place] b/c she could not come to her visit due to requiring 14 day quarantine after visiting [place] in [place] Shoulder Right; Future - DG Humerus Right; Future  2. Acute pain of right shoulder -s/p fall at home - DG Shoulder Right; Future - DG Humerus Right; Future  3. Pain in right arm -s/p fall at home - DG Shoulder Right; Future - DG Humerus Right; Future  4. Chronic RUQ pain -had another episode this time with one day of fever, her usual nausea and abdominal pain, did not get evaluated and it resolved spontaneously by her report  5. Osteopenia of multiple sites -high risk for fx with ongoing  tobacco abuse and poor nutritional status - check xrays s/p fall  6. Tobacco use disorder -ongoing and not ready or willing to quit smoking, has reduced alcohol intake  7. High risk medication use -on chronic opioids and anxiolytics due to chronic rUQ pain s/p chole and caregiver stress respectively - CBC with Differential/Platelet; Future - Basic metabolic panel; Future - Hepatic function panel; Future  8. Hyperglycemia -f/u lab: - Hemoglobin A1c; Future -she's been eating a lot of grits so may be up  9. Hyperlipidemia, unspecified hyperlipidemia type -on therapy with zocor  Lab Results  Component Value Date   CHOL 198 11/08/2018   HDL 53 11/08/2018   LDLCALC 118 (H) 11/08/2018   TRIG 153 (H) 11/08/2018   CHOLHDL 3.7 11/08/2018   - Lipid panel  Labs/tests ordered:  Right upper arm and shoulder xrays;  Next appt:  4 mos med mgt, fasting labs before Non face-to-face time spent on televisit:  25 mins  Kimia Finan L. Malynda Smolinski, D.O. [facility] [address], [place] [address] Cell Phone (Mon-Fri 8am-5pm):  [phone] On Call:  [phone] & follow prompts after 5pm & weekends Office Phone:  [phone] Office Fax:  [phone]

## 2019-03-08 ENCOUNTER — Telehealth: Payer: Self-pay | Admitting: Internal Medicine

## 2019-03-08 NOTE — Telephone Encounter (Signed)
Pt wants to leave correct fax# to send xray referral to [facility]  Fax# [phone]  Xray for rt humerous & shoulder  Thanks, Vilinda Blanks.

## 2019-03-08 NOTE — Telephone Encounter (Signed)
Ok.  Can you please try to send those orders to the new number?  Thanks.

## 2019-03-09 ENCOUNTER — Other Ambulatory Visit: Payer: Self-pay | Admitting: Internal Medicine

## 2019-03-09 ENCOUNTER — Other Ambulatory Visit: Payer: Self-pay | Admitting: *Deleted

## 2019-03-09 DIAGNOSIS — R1011 Right upper quadrant pain: Secondary | ICD-10-CM

## 2019-03-09 DIAGNOSIS — G8929 Other chronic pain: Secondary | ICD-10-CM

## 2019-03-09 DIAGNOSIS — F5101 Primary insomnia: Secondary | ICD-10-CM

## 2019-03-09 DIAGNOSIS — M25511 Pain in right shoulder: Secondary | ICD-10-CM | POA: Diagnosis not present

## 2019-03-09 DIAGNOSIS — F411 Generalized anxiety disorder: Secondary | ICD-10-CM

## 2019-03-09 DIAGNOSIS — M79601 Pain in right arm: Secondary | ICD-10-CM | POA: Diagnosis not present

## 2019-03-09 DIAGNOSIS — M79621 Pain in right upper arm: Secondary | ICD-10-CM | POA: Diagnosis not present

## 2019-03-09 DIAGNOSIS — Z043 Encounter for examination and observation following other accident: Secondary | ICD-10-CM | POA: Diagnosis not present

## 2019-03-09 MED ORDER — ZOLPIDEM TARTRATE 10 MG PO TABS: ORAL_TABLET | ORAL | 0 refills | Status: DC

## 2019-03-09 MED ORDER — OXYCODONE HCL 10 MG PO TABS: ORAL_TABLET | ORAL | 0 refills | Status: DC

## 2019-03-09 NOTE — Telephone Encounter (Signed)
[place] Verified  Lorazepam LR: 6/23-Not due till 03/16/19 Zolpidem LR: 6/17 Oxycodone LR: 6/17

## 2019-03-09 NOTE — Telephone Encounter (Signed)
Patient requested refill [place] Verified LR: 02/08/2019 Pended Rx and sent to Dr. Reed for approval.  

## 2019-03-10 NOTE — Telephone Encounter (Signed)
Order faxed to new number

## 2019-03-14 ENCOUNTER — Ambulatory Visit: Payer: Medicare Other | Admitting: Internal Medicine

## 2019-03-14 ENCOUNTER — Telehealth: Payer: Self-pay | Admitting: *Deleted

## 2019-03-14 NOTE — Telephone Encounter (Signed)
Spoke with patient and advised results, no fracture seen in shoulder or humerus. Xray sent to scan.

## 2019-03-21 ENCOUNTER — Other Ambulatory Visit: Payer: Self-pay | Admitting: *Deleted

## 2019-03-21 DIAGNOSIS — F411 Generalized anxiety disorder: Secondary | ICD-10-CM

## 2019-03-21 MED ORDER — LORAZEPAM 0.5 MG PO TABS: ORAL_TABLET | ORAL | 0 refills | Status: DC

## 2019-03-21 NOTE — Telephone Encounter (Signed)
Patient requested refill. Phoned to pharmacy.  

## 2019-03-22 ENCOUNTER — Other Ambulatory Visit: Payer: Self-pay | Admitting: Internal Medicine

## 2019-03-22 DIAGNOSIS — B009 Herpesviral infection, unspecified: Secondary | ICD-10-CM

## 2019-03-28 ENCOUNTER — Other Ambulatory Visit: Payer: Self-pay | Admitting: *Deleted

## 2019-03-28 DIAGNOSIS — B009 Herpesviral infection, unspecified: Secondary | ICD-10-CM

## 2019-03-28 MED ORDER — VALACYCLOVIR HCL 500 MG PO TABS: 500.0000 mg | ORAL_TABLET | Freq: Two times a day (BID) | ORAL | 1 refills | Status: DC

## 2019-03-28 NOTE — Telephone Encounter (Signed)
Patient requested refill Pended and sent to Dr. Mariea Clonts for approval.

## 2019-04-05 ENCOUNTER — Other Ambulatory Visit: Payer: Self-pay | Admitting: Internal Medicine

## 2019-04-05 DIAGNOSIS — G8929 Other chronic pain: Secondary | ICD-10-CM

## 2019-04-05 DIAGNOSIS — F5101 Primary insomnia: Secondary | ICD-10-CM

## 2019-04-05 NOTE — Telephone Encounter (Signed)
Last filled 03/09/2019

## 2019-04-12 DIAGNOSIS — Z23 Encounter for immunization: Secondary | ICD-10-CM | POA: Diagnosis not present

## 2019-04-21 ENCOUNTER — Other Ambulatory Visit: Payer: Self-pay | Admitting: *Deleted

## 2019-04-21 DIAGNOSIS — F411 Generalized anxiety disorder: Secondary | ICD-10-CM

## 2019-04-21 MED ORDER — LORAZEPAM 0.5 MG PO TABS: ORAL_TABLET | ORAL | 0 refills | Status: DC

## 2019-04-21 NOTE — Telephone Encounter (Signed)
Patient requested refill [place] Database Verified LR: 03/21/2019 Pended Rx and sent to Dr. Mariea Clonts for approval.

## 2019-04-24 ENCOUNTER — Other Ambulatory Visit: Payer: Self-pay | Admitting: Internal Medicine

## 2019-04-24 DIAGNOSIS — G8929 Other chronic pain: Secondary | ICD-10-CM

## 2019-04-24 DIAGNOSIS — R1011 Right upper quadrant pain: Secondary | ICD-10-CM

## 2019-04-26 ENCOUNTER — Other Ambulatory Visit: Payer: Self-pay | Admitting: Internal Medicine

## 2019-05-04 ENCOUNTER — Other Ambulatory Visit: Payer: Self-pay | Admitting: *Deleted

## 2019-05-04 DIAGNOSIS — F5101 Primary insomnia: Secondary | ICD-10-CM

## 2019-05-04 DIAGNOSIS — G8929 Other chronic pain: Secondary | ICD-10-CM

## 2019-05-04 DIAGNOSIS — R1011 Right upper quadrant pain: Secondary | ICD-10-CM

## 2019-05-04 MED ORDER — ZOLPIDEM TARTRATE 10 MG PO TABS: ORAL_TABLET | ORAL | 0 refills | Status: DC

## 2019-05-04 MED ORDER — OXYCODONE HCL 10 MG PO TABS: ORAL_TABLET | ORAL | 0 refills | Status: DC

## 2019-05-04 NOTE — Telephone Encounter (Signed)
Patient requested refill [place] Database Verified LR: 04/06/2019 Pended Rx and sent to Dr. Mariea Clonts for approval.

## 2019-05-09 ENCOUNTER — Ambulatory Visit (INDEPENDENT_AMBULATORY_CARE_PROVIDER_SITE_OTHER): Payer: Medicare Other | Admitting: Family

## 2019-05-09 ENCOUNTER — Other Ambulatory Visit: Payer: Self-pay

## 2019-05-09 DIAGNOSIS — R0781 Pleurodynia: Secondary | ICD-10-CM

## 2019-05-09 NOTE — Progress Notes (Addendum)
Patient ID: Theresa Carey, female   DOB: 07-15-1956, 63 y.o.   MRN: [id number] This service is provided via telemedicine  No vital signs collected/recorded due to the encounter was a telemedicine visit.   Location of patient (ex: home, work):  HOME  Patient consents to a telephone visit:  YES  Location of the provider (ex: office, home): OFFICE   Name of any referring provider:  TIFFANY REED, DO  Names of all persons participating in the telemedicine service and their role in the encounter:  PATIENT, Theresa Carey, [place], [place], NP  Time spent on call:  3:03   Location:      Place of Service:    Provider: Jmichael Gille FNP-C  Gayland Curry, DO  Patient Care Team: Gayland Curry, DO as PCP - General (Geriatric Medicine)  Extended Emergency Contact Information Primary Emergency Contact: Doylene Canning, [place] [address] [place] Phone: [phone] Relation: Friend Secondary Emergency Contact: [place] Mobile Phone: [phone] Relation: Father  Code Status:  Goals of care: Advanced Directive information Advanced Directives 02/22/2018  Does Patient Have a Medical Advance Directive? No  Would patient like information on creating a medical advance directive? No - Patient declined     Chief Complaint  Patient presents with  . Acute Visit    Left side rib pain    HPI:  Pt is a 63 y.o. female seen today for an acute visit for evaluation of left side rib pain.she states left rib pain worst in the morning when she gets up.she has been taking care of her parents in [place].she has had to lift her dad to transfer wonders whether she pulled something. She denies any fever,chills,cough or shortness of breath.No recent injury to left side.she fell backwards in July,2020. She request X-ray order to be send to [place] [facility].     Past Medical History:  Diagnosis Date  . Abdominal pain 2000    chronic abdominal pain with nausea and vomiting  . Abnormal liver function   . Allergy   . Hypertension    Past Surgical History:  Procedure Laterality Date  . ABDOMINAL HYSTERECTOMY    . CHOLECYSTECTOMY      Allergies  Allergen Reactions  . Avelox [Moxifloxacin Hcl In Nacl]     Low blood pressure  . Compazine [Prochlorperazine Edisylate] Other (See Comments)    Caused lockjaw  . Ibuprofen Hives  . Morphine And Related Nausea And Vomiting  . Promethazine Hcl Nausea And Vomiting    Outpatient Encounter Medications as of 05/09/2019  Medication Sig  . Cholecalciferol (VITAMIN D) 2000 UNITS CAPS Take 1 capsule (2,000 Units total) by mouth daily.  Marland Kitchen escitalopram (LEXAPRO) 20 MG tablet take 1 tablet by mouth once daily  . famotidine (PEPCID) 40 MG tablet take 1 tablet by mouth once daily  . lisinopril (ZESTRIL) 20 MG tablet take 1 tablet by mouth once daily  . LORazepam (ATIVAN) 0.5 MG tablet Take one tablet by mouth every 6 hours as needed  . Oxycodone HCl 10 MG TABS take 1 tablet by mouth every 6 hours if needed for pain  . simvastatin (ZOCOR) 20 MG tablet take 1 tablet by mouth at bedtime  . valACYclovir (VALTREX) 500 MG tablet Take 1 tablet (500 mg total) by mouth 2 (two) times daily.  Marland Kitchen zolpidem (AMBIEN) 10 MG tablet take 1 tablet by mouth at bedtime for sleep  No facility-administered encounter medications on file as of 05/09/2019.     Review of Systems  Constitutional: Negative for appetite change, chills, fatigue and fever.  Respiratory: Negative for cough, chest tightness, shortness of breath and wheezing.   Cardiovascular: Negative for chest pain, palpitations and leg swelling.       Left rib pain worst in the morning.  Musculoskeletal:       [place] backwards in July,2020 no recent fall since then.   Skin: Negative for color change, pallor, rash and wound.  Neurological: Negative for dizziness, light-headedness, numbness and headaches.    Immunization History   Administered Date(s) Administered  . Influenza Inj Mdck Quad Pf 06/16/2017  . Influenza Split 08/15/2012  . Influenza Whole 07/02/2013  . Influenza,inj,Quad PF,6+ Mos 09/28/2015  . Influenza-Unspecified 05/22/2014, 06/21/2016, 06/04/2018  . Pneumococcal Conjugate-13 08/31/2014, 08/31/2014  . Pneumococcal Polysaccharide-23 08/15/2012  . Tdap 09/28/2015   Pertinent  Health Maintenance Due  Topic Date Due  . PNA vac Low Risk Adult (2 of 2 - PPSV23) 09/19/2018  . INFLUENZA VACCINE  03/26/2019  . MAMMOGRAM  04/12/2020  . COLONOSCOPY  06/30/2023  . DEXA SCAN  Completed   Fall Risk  03/07/2019 11/08/2018 07/05/2018 02/22/2018 08/20/2017  Falls in the past year? 1 0 1 Yes No  Number falls in past yr: 0 0 0 1 -  Injury with Fall? 0 0 0 Yes -   There were no vitals filed for this visit. There is no height or weight on file to calculate BMI. Physical Exam  Unable to complete on Telephone visit.   Labs reviewed: Recent Labs    11/08/18 1117  NA 139  K 4.6  CL 103  CO2 29  GLUCOSE 96  BUN 15  CREATININE 0.67  CALCIUM 9.9   Recent Labs    11/08/18 1117  AST 12  ALT 11  BILITOT 0.3  PROT 6.3   Recent Labs    11/08/18 1117  WBC 6.6  NEUTROABS 3,854  HGB 13.3  HCT 39.8  MCV 96.6  PLT 415*    Lab Results  Component Value Date   HGBA1C 5.4 11/08/2018   Lab Results  Component Value Date   CHOL 198 11/08/2018   HDL 53 11/08/2018   LDLCALC 118 (H) 11/08/2018   TRIG 153 (H) 11/08/2018   CHOLHDL 3.7 11/08/2018    Significant Diagnostic Results in last 30 days:  No results found.  Assessment/Plan  Rib pain on left side Unclear etiology but might suspect might be from lifting the dad as she transfer to chair.No shortness of breath or cough.Afebrile.Recommended evaluation in ED or Urgent care but patient prefers for X-ray to be ordered.will obtain a X-ray 2 views left rib cage to rule out any fracture.Order written and faxed to [facility] in [place].    Family/ staff Communication: Reviewed plan of care with patient.  Labs/tests ordered: X-ray 2 views of left rib cage   Spent 11 minutes of non-face to face with patient    Sandrea Hughs, NP

## 2019-05-10 ENCOUNTER — Encounter: Payer: Self-pay | Admitting: Internal Medicine

## 2019-05-10 DIAGNOSIS — R52 Pain, unspecified: Secondary | ICD-10-CM | POA: Diagnosis not present

## 2019-05-10 DIAGNOSIS — R0781 Pleurodynia: Secondary | ICD-10-CM | POA: Diagnosis not present

## 2019-05-18 ENCOUNTER — Telehealth: Payer: Self-pay | Admitting: *Deleted

## 2019-05-18 NOTE — Telephone Encounter (Signed)
I called patient to notify her of results.  She was not available and was asked to call back after 3:00 p.m. today.

## 2019-05-18 NOTE — Telephone Encounter (Signed)
Patient is wanting the results from her Rib X-Ray's she had done in [place]. Have you reviewed them? Did not see scanned into chart yet. Please Advise.

## 2019-05-18 NOTE — Telephone Encounter (Signed)
Patient called and results of chest/left rib x-rays given.

## 2019-05-18 NOTE — Telephone Encounter (Signed)
I did review them and left comments for pt to be called with results--gave them to whoever I was working with that day.  I do not recall which day so I am uncertain who would have them now.

## 2019-05-26 ENCOUNTER — Other Ambulatory Visit: Payer: Self-pay | Admitting: *Deleted

## 2019-05-26 DIAGNOSIS — F411 Generalized anxiety disorder: Secondary | ICD-10-CM

## 2019-05-26 MED ORDER — LORAZEPAM 0.5 MG PO TABS: ORAL_TABLET | ORAL | 0 refills | Status: DC

## 2019-05-26 NOTE — Telephone Encounter (Signed)
Patient requested refill. Phoned to pharmacy.  

## 2019-05-31 ENCOUNTER — Other Ambulatory Visit: Payer: Self-pay | Admitting: Internal Medicine

## 2019-05-31 DIAGNOSIS — G8929 Other chronic pain: Secondary | ICD-10-CM

## 2019-05-31 DIAGNOSIS — F439 Reaction to severe stress, unspecified: Secondary | ICD-10-CM

## 2019-05-31 DIAGNOSIS — F5101 Primary insomnia: Secondary | ICD-10-CM

## 2019-05-31 NOTE — Telephone Encounter (Signed)
Patient last seen for routine management (televisit) by provider 03/07/2019 and no future appointment scheduled. Patient is requesting refills for Ambien and Oxycodone. Verified in [place] data base last refill for Ambien 05/04/2019 for 30 tabs and oxycodone last refill 05/04/2019. Routing to provider for approval.

## 2019-06-13 ENCOUNTER — Other Ambulatory Visit: Payer: Self-pay | Admitting: Internal Medicine

## 2019-06-13 DIAGNOSIS — B009 Herpesviral infection, unspecified: Secondary | ICD-10-CM

## 2019-06-30 ENCOUNTER — Other Ambulatory Visit: Payer: Self-pay | Admitting: *Deleted

## 2019-06-30 DIAGNOSIS — R1011 Right upper quadrant pain: Secondary | ICD-10-CM

## 2019-06-30 DIAGNOSIS — G8929 Other chronic pain: Secondary | ICD-10-CM

## 2019-06-30 DIAGNOSIS — F5101 Primary insomnia: Secondary | ICD-10-CM

## 2019-06-30 MED ORDER — ZOLPIDEM TARTRATE 10 MG PO TABS: ORAL_TABLET | ORAL | 0 refills | Status: DC

## 2019-06-30 MED ORDER — OXYCODONE HCL 10 MG PO TABS: ORAL_TABLET | ORAL | 0 refills | Status: DC

## 2019-06-30 NOTE — Telephone Encounter (Signed)
Patient requested refill [place] [facility] Verified LR: 05/31/19 Pended Rx and sent to [facility] for approval (Dr. Mariea Clonts out of office)

## 2019-06-30 NOTE — Telephone Encounter (Signed)
Will okay refills but she needs to set up follow up appt with Dr Mariea Clonts, she is over due for routine follow up and has no upcoming appts.

## 2019-07-04 ENCOUNTER — Other Ambulatory Visit: Payer: Self-pay | Admitting: *Deleted

## 2019-07-04 DIAGNOSIS — F411 Generalized anxiety disorder: Secondary | ICD-10-CM

## 2019-07-04 MED ORDER — LORAZEPAM 0.5 MG PO TABS: ORAL_TABLET | ORAL | 0 refills | Status: DC

## 2019-07-04 NOTE — Telephone Encounter (Signed)
Patient requested refill [place] Verified LR: 05/26/2019 Pended Rx and sent to Dr. Mariea Clonts for approval.

## 2019-07-28 ENCOUNTER — Telehealth: Payer: Self-pay | Admitting: *Deleted

## 2019-07-28 ENCOUNTER — Other Ambulatory Visit: Payer: Self-pay | Admitting: *Deleted

## 2019-07-28 DIAGNOSIS — R1011 Right upper quadrant pain: Secondary | ICD-10-CM

## 2019-07-28 DIAGNOSIS — G8929 Other chronic pain: Secondary | ICD-10-CM

## 2019-07-28 DIAGNOSIS — F5101 Primary insomnia: Secondary | ICD-10-CM

## 2019-07-28 MED ORDER — FAMOTIDINE 40 MG PO TABS: 40.0000 mg | ORAL_TABLET | Freq: Every day | ORAL | 0 refills | Status: DC

## 2019-07-28 MED ORDER — OXYCODONE HCL 10 MG PO TABS: ORAL_TABLET | ORAL | 0 refills | Status: DC

## 2019-07-28 MED ORDER — ZOLPIDEM TARTRATE 10 MG PO TABS: ORAL_TABLET | ORAL | 0 refills | Status: DC

## 2019-07-28 NOTE — Telephone Encounter (Signed)
[facility] [place] called and stated that they only have #33 of Oxycodone in stock. Stated that they are going to give her the #33 but it is only a 8 day supply and will need another Rx called in in 8 days. They just wanted to call for FYI.

## 2019-07-28 NOTE — Telephone Encounter (Signed)
Patient called requesting refills [place] Verified LR: 06/30/2019 Pended Rx and sent to Dr. Mariea Clonts for approval.

## 2019-07-28 NOTE — Telephone Encounter (Signed)
When she's in town, she will need to have an appt for her contract to be renewed.

## 2019-07-28 NOTE — Telephone Encounter (Signed)
Noted.  Odd that they require a separate script then but not a revised on now to just dispense the 33.

## 2019-07-29 ENCOUNTER — Other Ambulatory Visit: Payer: Self-pay

## 2019-07-29 ENCOUNTER — Ambulatory Visit (INDEPENDENT_AMBULATORY_CARE_PROVIDER_SITE_OTHER): Payer: Medicare Other | Admitting: Family

## 2019-07-29 ENCOUNTER — Encounter: Payer: Self-pay | Admitting: Family

## 2019-07-29 DIAGNOSIS — R509 Fever, unspecified: Secondary | ICD-10-CM

## 2019-07-29 DIAGNOSIS — Z20828 Contact with and (suspected) exposure to other viral communicable diseases: Secondary | ICD-10-CM | POA: Diagnosis not present

## 2019-07-29 DIAGNOSIS — B349 Viral infection, unspecified: Secondary | ICD-10-CM | POA: Diagnosis not present

## 2019-07-29 NOTE — Patient Instructions (Signed)
This information is directly available on the CDC website: https://www.cdc.gov/coronavirus/2019-ncov/if-you-are-sick/steps-when-sick.html    Source:CDC Reference to specific commercial products, manufacturers, companies, or trademarks does not constitute its endorsement or recommendation by the U.S. Government, Department of Health and Human Services, or Centers for Disease Control and Prevention.  

## 2019-07-29 NOTE — Progress Notes (Signed)
This service is provided via telemedicine  No vital signs collected/recorded due to the encounter was a telemedicine visit.   Location of patient (ex: home, work):  Home    Patient consents to a telephone visit:  Yes  Location of the provider (ex: office, home):Office    Name of any referring provider: N/A   Names of all persons participating in the telemedicine service and their role in the encounter: Marisa Cyphers RMA, Webb Silversmith Ngetich NP, Nicki [place] Patient   Time spent on call:  10 min   Provider: Dinah Ngetich FNP-C  Gayland Curry, DO  Patient Care Team: Gayland Curry, DO as PCP - General (Geriatric Medicine)  Extended Emergency Contact Information Primary Emergency Contact: Doylene Canning, [place] [address] [place] Phone: [phone] Relation: Friend Secondary Emergency Contact: [place] Mobile Phone: [phone] Relation: Father  Code Status:  DNR Goals of care: Advanced Directive information Advanced Directives 07/29/2019  Does Patient Have a Medical Advance Directive? No  Would patient like information on creating a medical advance directive? -     Chief Complaint  Patient presents with  . Acute Visit    Patients c/o Fever, ST and body aches    HPI:  Pt is a 63 y.o. female seen today for an acute visit for evaluation of fever,chills,sore throat and generalized body aches for several days.she states was in contact with two people who have tested positive for COVID-19.she was in contact with first person one and a half weeks ago and the second person about two weeks ago. She denies any cough,chest tightness,chest pain,wheezing or shortness of breath.she states currently out of state in [place] taking care of her parents.she went to [facility] and was evaluated by Nurse Practitioner.she states was told to get order from PCP for COVID-19 testing and faxed to [facility] Telephone # [phone]  and fax [phone].Patient unclear about why test was not ordered by NP in [place] states they won't accept paper order but at the same time tells me to fax the order. I called at Telephone # [place] and Nurse states patient was seen and order for COVID-19 testing has been taken care off by their provider.No need for another order.Patient called back and notified.   Past Medical History:  Diagnosis Date  . Abdominal pain 2000   chronic abdominal pain with nausea and vomiting  . Abnormal liver function   . Allergy   . Hypertension    Past Surgical History:  Procedure Laterality Date  . ABDOMINAL HYSTERECTOMY    . CHOLECYSTECTOMY      Allergies  Allergen Reactions  . Avelox [Moxifloxacin Hcl In Nacl]     Low blood pressure  . Compazine [Prochlorperazine Edisylate] Other (See Comments)    Caused lockjaw  . Ibuprofen Hives  . Morphine And Related Nausea And Vomiting  . Promethazine Hcl Nausea And Vomiting    Outpatient Encounter Medications as of 07/29/2019  Medication Sig  . Cholecalciferol (VITAMIN D) 2000 UNITS CAPS Take 1 capsule (2,000 Units total) by mouth daily.  Marland Kitchen escitalopram (LEXAPRO) 20 MG tablet take 1 tablet by mouth once daily  . famotidine (PEPCID) 40 MG tablet Take 1 tablet (40 mg total) by mouth daily.  Marland Kitchen lisinopril (ZESTRIL) 20 MG tablet take 1 tablet by mouth once daily  . LORazepam (ATIVAN) 0.5 MG tablet Take one tablet by mouth every 6 hours as needed  .  Oxycodone HCl 10 MG TABS Take one tablet by mouth every 6 hours as needed for pain  . simvastatin (ZOCOR) 20 MG tablet take 1 tablet by mouth at bedtime  . valACYclovir (VALTREX) 500 MG tablet take 1 tablet by mouth twice a day  . zolpidem (AMBIEN) 10 MG tablet Take one tablet by mouth once daily at bedtime for sleep   No facility-administered encounter medications on file as of 07/29/2019.     Review of Systems  Constitutional: Positive for appetite change, chills, fatigue and fever.   HENT: Positive for rhinorrhea and sore throat. Negative for sinus pressure, sinus pain and sneezing.   Eyes: Negative for discharge, redness and itching.  Respiratory: Negative for cough, chest tightness, shortness of breath and wheezing.   Cardiovascular: Negative for chest pain, palpitations and leg swelling.  Gastrointestinal: Negative for abdominal distention, abdominal pain, constipation, diarrhea, nausea and vomiting.  Genitourinary: Negative for decreased urine volume, difficulty urinating, dyspareunia, dysuria, flank pain and urgency.  Musculoskeletal: Positive for back pain.       Chronic pain from lifting the parents.  Skin: Negative for color change, pallor and rash.  Neurological: Negative for dizziness, light-headedness and headaches.  Psychiatric/Behavioral: Negative for agitation, confusion and sleep disturbance.    Immunization History  Administered Date(s) Administered  . Influenza Inj Mdck Quad Pf 06/16/2017  . Influenza Split 08/15/2012  . Influenza Whole 07/02/2013  . Influenza,inj,Quad PF,6+ Mos 09/28/2015  . Influenza-Unspecified 05/22/2014, 06/21/2016, 06/04/2018, 06/10/2019  . Pneumococcal Conjugate-13 08/31/2014, 08/31/2014  . Pneumococcal Polysaccharide-23 08/15/2012  . Tdap 09/28/2015   Pertinent  Health Maintenance Due  Topic Date Due  . PNA vac Low Risk Adult (2 of 2 - PPSV23) 09/19/2018  . MAMMOGRAM  04/12/2020  . COLONOSCOPY  06/30/2023  . INFLUENZA VACCINE  Completed  . DEXA SCAN  Completed   Fall Risk  07/29/2019 03/07/2019 11/08/2018 07/05/2018 02/22/2018  Falls in the past year? 0 1 0 1 Yes  Number falls in past yr: - 0 0 0 1  Injury with Fall? 0 0 0 0 Yes    There were no vitals filed for this visit. There is no height or weight on file to calculate BMI. Physical Exam  Unable to completed on Telephone visit.   Labs reviewed: Recent Labs    11/08/18 1117  NA 139  K 4.6  CL 103  CO2 29  GLUCOSE 96  BUN 15  CREATININE 0.67  CALCIUM  9.9   Recent Labs    11/08/18 1117  AST 12  ALT 11  BILITOT 0.3  PROT 6.3   Recent Labs    11/08/18 1117  WBC 6.6  NEUTROABS 3,854  HGB 13.3  HCT 39.8  MCV 96.6  PLT 415*    Lab Results  Component Value Date   HGBA1C 5.4 11/08/2018   Lab Results  Component Value Date   CHOL 198 11/08/2018   HDL 53 11/08/2018   LDLCALC 118 (H) 11/08/2018   TRIG 153 (H) 11/08/2018   CHOLHDL 3.7 11/08/2018    Significant Diagnostic Results in last 30 days:  No results found.  Assessment/Plan  Fever with exposure to COVID-19 virus Reports Temp 99.0 had exposure to two people with COVID-19 positive.Has had chills,sore throat and generalized body aches.She has been evaluated at [facility] in [place] spoke with Nurse at telephone [phone] states COVID-19 testing has been ordered by their provider no need for another order.Patient advised to increase fluid/soup intake.Advised to take OTC vitamin C 500  mg tablet daily.Already on vit D supplement. - continue to wear facial mask,social distancing,hand hygiene and self quarantine per CDC guidelines.will need her parents tested too if her results comes back positive for COVID-19. Education instruction added to AVS today.    Family/ staff Communication: Reviewed plan of care with patient.   Labs/tests ordered: COVID-19 test  Has already been ordered at [facility] per Nurse.   Spent 14  minutes of non-face to face with patient   Next appointment: PRN    Sandrea Hughs, NP

## 2019-08-03 NOTE — Telephone Encounter (Signed)
Patient called and stated that she needs a new Rx for medication. Pended and forwarded to Dr. Mariea Clonts for approval.

## 2019-08-03 NOTE — Telephone Encounter (Signed)
Ok to send the remaining supply.

## 2019-08-04 ENCOUNTER — Other Ambulatory Visit: Payer: Self-pay | Admitting: Internal Medicine

## 2019-08-04 DIAGNOSIS — F411 Generalized anxiety disorder: Secondary | ICD-10-CM

## 2019-08-04 MED ORDER — OXYCODONE HCL 10 MG PO TABS: ORAL_TABLET | ORAL | 0 refills | Status: DC

## 2019-08-04 NOTE — Telephone Encounter (Signed)
Dr. Mariea Clonts patient, last filled 07/04/2019

## 2019-08-04 NOTE — Telephone Encounter (Signed)
Pended Rx and sent to Dinah to approve.  Dr. Mariea Clonts out of office.

## 2019-08-21 ENCOUNTER — Other Ambulatory Visit: Payer: Self-pay | Admitting: Internal Medicine

## 2019-08-21 DIAGNOSIS — B009 Herpesviral infection, unspecified: Secondary | ICD-10-CM

## 2019-08-30 ENCOUNTER — Telehealth: Payer: Self-pay | Admitting: *Deleted

## 2019-08-30 DIAGNOSIS — K219 Gastro-esophageal reflux disease without esophagitis: Secondary | ICD-10-CM | POA: Diagnosis not present

## 2019-08-30 DIAGNOSIS — R079 Chest pain, unspecified: Secondary | ICD-10-CM | POA: Diagnosis not present

## 2019-08-30 DIAGNOSIS — Z79899 Other long term (current) drug therapy: Secondary | ICD-10-CM | POA: Diagnosis not present

## 2019-08-30 DIAGNOSIS — R0781 Pleurodynia: Secondary | ICD-10-CM | POA: Diagnosis not present

## 2019-08-30 DIAGNOSIS — R9431 Abnormal electrocardiogram [ECG] [EKG]: Secondary | ICD-10-CM | POA: Diagnosis not present

## 2019-08-30 NOTE — Telephone Encounter (Signed)
Patient called and stated that she fell and went to the ER in [place]. Stated that she has bruised ribs and it hurts to take a deep breath. She was released. Patient is wanting to know if you can give her a stronger pain medication to help with the pain. Still having to help with her mom and dad. Please Advise.

## 2019-08-30 NOTE — Telephone Encounter (Signed)
Unfortunately, I cannot adjust her pain medications w/o seeing her at least virtually (phone visit not enough) for her fall.  I recommend she use ice, topicals and do her best to rest.

## 2019-08-30 NOTE — Telephone Encounter (Signed)
Patient notified and agreed.  

## 2019-08-31 ENCOUNTER — Other Ambulatory Visit: Payer: Self-pay | Admitting: Family

## 2019-08-31 ENCOUNTER — Other Ambulatory Visit: Payer: Self-pay | Admitting: Internal Medicine

## 2019-08-31 DIAGNOSIS — F5101 Primary insomnia: Secondary | ICD-10-CM

## 2019-08-31 DIAGNOSIS — G8929 Other chronic pain: Secondary | ICD-10-CM

## 2019-08-31 NOTE — Telephone Encounter (Signed)
Last filled 08/04/2019

## 2019-08-31 NOTE — Telephone Encounter (Signed)
Last filled 07/28/2019

## 2019-09-01 ENCOUNTER — Other Ambulatory Visit: Payer: Self-pay | Admitting: Internal Medicine

## 2019-09-01 DIAGNOSIS — G8929 Other chronic pain: Secondary | ICD-10-CM

## 2019-09-08 ENCOUNTER — Other Ambulatory Visit: Payer: Self-pay | Admitting: Family

## 2019-09-08 DIAGNOSIS — F411 Generalized anxiety disorder: Secondary | ICD-10-CM

## 2019-09-28 DIAGNOSIS — Z885 Allergy status to narcotic agent status: Secondary | ICD-10-CM | POA: Diagnosis not present

## 2019-09-28 DIAGNOSIS — R55 Syncope and collapse: Secondary | ICD-10-CM | POA: Diagnosis not present

## 2019-09-28 DIAGNOSIS — R7989 Other specified abnormal findings of blood chemistry: Secondary | ICD-10-CM | POA: Diagnosis present

## 2019-09-28 DIAGNOSIS — D72829 Elevated white blood cell count, unspecified: Secondary | ICD-10-CM | POA: Diagnosis not present

## 2019-09-28 DIAGNOSIS — Z20822 Contact with and (suspected) exposure to covid-19: Secondary | ICD-10-CM | POA: Diagnosis not present

## 2019-09-28 DIAGNOSIS — R9431 Abnormal electrocardiogram [ECG] [EKG]: Secondary | ICD-10-CM | POA: Diagnosis not present

## 2019-09-28 DIAGNOSIS — G8929 Other chronic pain: Secondary | ICD-10-CM | POA: Diagnosis present

## 2019-09-28 DIAGNOSIS — I214 Non-ST elevation (NSTEMI) myocardial infarction: Secondary | ICD-10-CM | POA: Diagnosis not present

## 2019-09-28 DIAGNOSIS — F419 Anxiety disorder, unspecified: Secondary | ICD-10-CM | POA: Diagnosis present

## 2019-09-28 DIAGNOSIS — I951 Orthostatic hypotension: Secondary | ICD-10-CM | POA: Diagnosis present

## 2019-09-28 DIAGNOSIS — K219 Gastro-esophageal reflux disease without esophagitis: Secondary | ICD-10-CM | POA: Diagnosis present

## 2019-09-28 DIAGNOSIS — Z90710 Acquired absence of both cervix and uterus: Secondary | ICD-10-CM | POA: Diagnosis not present

## 2019-09-28 DIAGNOSIS — R110 Nausea: Secondary | ICD-10-CM | POA: Diagnosis not present

## 2019-09-28 DIAGNOSIS — Z79899 Other long term (current) drug therapy: Secondary | ICD-10-CM | POA: Diagnosis not present

## 2019-09-28 DIAGNOSIS — Z86010 Personal history of colonic polyps: Secondary | ICD-10-CM | POA: Diagnosis not present

## 2019-09-28 DIAGNOSIS — I514 Myocarditis, unspecified: Secondary | ICD-10-CM | POA: Diagnosis present

## 2019-09-28 DIAGNOSIS — R197 Diarrhea, unspecified: Secondary | ICD-10-CM | POA: Diagnosis not present

## 2019-09-28 DIAGNOSIS — F17210 Nicotine dependence, cigarettes, uncomplicated: Secondary | ICD-10-CM | POA: Diagnosis present

## 2019-09-28 DIAGNOSIS — I10 Essential (primary) hypertension: Secondary | ICD-10-CM | POA: Diagnosis not present

## 2019-09-28 DIAGNOSIS — Z90711 Acquired absence of uterus with remaining cervical stump: Secondary | ICD-10-CM | POA: Diagnosis not present

## 2019-09-28 DIAGNOSIS — R1011 Right upper quadrant pain: Secondary | ICD-10-CM | POA: Diagnosis not present

## 2019-09-28 DIAGNOSIS — F17200 Nicotine dependence, unspecified, uncomplicated: Secondary | ICD-10-CM | POA: Diagnosis not present

## 2019-09-28 DIAGNOSIS — Z79891 Long term (current) use of opiate analgesic: Secondary | ICD-10-CM | POA: Diagnosis not present

## 2019-09-28 DIAGNOSIS — Z20828 Contact with and (suspected) exposure to other viral communicable diseases: Secondary | ICD-10-CM | POA: Diagnosis not present

## 2019-09-28 DIAGNOSIS — R778 Other specified abnormalities of plasma proteins: Secondary | ICD-10-CM | POA: Diagnosis not present

## 2019-09-28 DIAGNOSIS — I5181 Takotsubo syndrome: Secondary | ICD-10-CM | POA: Diagnosis present

## 2019-09-28 DIAGNOSIS — I429 Cardiomyopathy, unspecified: Secondary | ICD-10-CM | POA: Diagnosis not present

## 2019-09-28 DIAGNOSIS — E860 Dehydration: Secondary | ICD-10-CM | POA: Diagnosis present

## 2019-09-28 DIAGNOSIS — Z720 Tobacco use: Secondary | ICD-10-CM | POA: Diagnosis not present

## 2019-09-28 DIAGNOSIS — Z743 Need for continuous supervision: Secondary | ICD-10-CM | POA: Diagnosis not present

## 2019-09-28 DIAGNOSIS — Z9049 Acquired absence of other specified parts of digestive tract: Secondary | ICD-10-CM | POA: Diagnosis not present

## 2019-09-28 LAB — BASIC METABOLIC PANEL
BUN: 19 (ref 4–21)
CO2: 22 (ref 13–22)
Creatinine: 1.1 (ref 0.5–1.1)
Glucose: 173

## 2019-09-28 LAB — HEPATIC FUNCTION PANEL
ALT: 26 (ref 7–35)
AST: 25 (ref 13–35)

## 2019-09-28 LAB — CBC AND DIFFERENTIAL
HCT: 46 (ref 36–46)
Hemoglobin: 14.9 (ref 12.0–16.0)
Platelets: 346 (ref 150–399)
WBC: 13.1

## 2019-09-29 ENCOUNTER — Other Ambulatory Visit: Payer: Self-pay | Admitting: Internal Medicine

## 2019-09-29 ENCOUNTER — Telehealth: Payer: Self-pay | Admitting: *Deleted

## 2019-09-29 DIAGNOSIS — Z79891 Long term (current) use of opiate analgesic: Secondary | ICD-10-CM | POA: Diagnosis not present

## 2019-09-29 DIAGNOSIS — D72829 Elevated white blood cell count, unspecified: Secondary | ICD-10-CM | POA: Diagnosis not present

## 2019-09-29 DIAGNOSIS — Z885 Allergy status to narcotic agent status: Secondary | ICD-10-CM | POA: Diagnosis not present

## 2019-09-29 DIAGNOSIS — F17210 Nicotine dependence, cigarettes, uncomplicated: Secondary | ICD-10-CM | POA: Diagnosis present

## 2019-09-29 DIAGNOSIS — I214 Non-ST elevation (NSTEMI) myocardial infarction: Secondary | ICD-10-CM | POA: Diagnosis not present

## 2019-09-29 DIAGNOSIS — I10 Essential (primary) hypertension: Secondary | ICD-10-CM | POA: Diagnosis not present

## 2019-09-29 DIAGNOSIS — R55 Syncope and collapse: Secondary | ICD-10-CM | POA: Diagnosis not present

## 2019-09-29 DIAGNOSIS — K219 Gastro-esophageal reflux disease without esophagitis: Secondary | ICD-10-CM | POA: Diagnosis not present

## 2019-09-29 DIAGNOSIS — F5101 Primary insomnia: Secondary | ICD-10-CM

## 2019-09-29 DIAGNOSIS — I5181 Takotsubo syndrome: Secondary | ICD-10-CM | POA: Diagnosis not present

## 2019-09-29 DIAGNOSIS — Z720 Tobacco use: Secondary | ICD-10-CM | POA: Diagnosis not present

## 2019-09-29 DIAGNOSIS — R7989 Other specified abnormal findings of blood chemistry: Secondary | ICD-10-CM | POA: Diagnosis present

## 2019-09-29 DIAGNOSIS — Z86010 Personal history of colonic polyps: Secondary | ICD-10-CM | POA: Diagnosis not present

## 2019-09-29 DIAGNOSIS — Z9049 Acquired absence of other specified parts of digestive tract: Secondary | ICD-10-CM | POA: Diagnosis not present

## 2019-09-29 DIAGNOSIS — R9431 Abnormal electrocardiogram [ECG] [EKG]: Secondary | ICD-10-CM | POA: Diagnosis not present

## 2019-09-29 DIAGNOSIS — E860 Dehydration: Secondary | ICD-10-CM | POA: Diagnosis present

## 2019-09-29 DIAGNOSIS — Z90710 Acquired absence of both cervix and uterus: Secondary | ICD-10-CM | POA: Diagnosis not present

## 2019-09-29 DIAGNOSIS — R1011 Right upper quadrant pain: Secondary | ICD-10-CM

## 2019-09-29 DIAGNOSIS — I429 Cardiomyopathy, unspecified: Secondary | ICD-10-CM | POA: Diagnosis not present

## 2019-09-29 DIAGNOSIS — F419 Anxiety disorder, unspecified: Secondary | ICD-10-CM | POA: Diagnosis not present

## 2019-09-29 DIAGNOSIS — Z79899 Other long term (current) drug therapy: Secondary | ICD-10-CM | POA: Diagnosis not present

## 2019-09-29 DIAGNOSIS — G8929 Other chronic pain: Secondary | ICD-10-CM

## 2019-09-29 DIAGNOSIS — I514 Myocarditis, unspecified: Secondary | ICD-10-CM | POA: Diagnosis present

## 2019-09-29 DIAGNOSIS — R778 Other specified abnormalities of plasma proteins: Secondary | ICD-10-CM | POA: Diagnosis not present

## 2019-09-29 DIAGNOSIS — I951 Orthostatic hypotension: Secondary | ICD-10-CM | POA: Diagnosis present

## 2019-09-29 MED ORDER — OXYCODONE HCL 10 MG PO TABS: ORAL_TABLET | ORAL | 0 refills | Status: DC

## 2019-09-29 MED ORDER — ZOLPIDEM TARTRATE 10 MG PO TABS: ORAL_TABLET | ORAL | 0 refills | Status: DC

## 2019-09-29 NOTE — Telephone Encounter (Signed)
Refill is to early 10/01/2019

## 2019-09-29 NOTE — Telephone Encounter (Signed)
Patient called and wanted to let you know that she went to the hospital yesterday and going home today. Stated that she had passed out. They did a Heart Cath and it showed cleared arteries. They did Cardiac testing and it shows that her heart is only working 30%. They placed her on Metoprolol 25mg  daily. She told them to also send you records.   Patient is also requesting refill on her pain and sleeping medications.   [place] Verified LR: 09/01/19 (pharmacy only gave #110, 28 days worth)  Pended Rx and sent to Dr. Mariea Clonts for approval.

## 2019-09-29 NOTE — Telephone Encounter (Signed)
Noted, I have some records on my desk now.

## 2019-09-29 NOTE — Telephone Encounter (Signed)
Medication list updated.

## 2019-09-29 NOTE — Telephone Encounter (Signed)
Medication pended for approval.  

## 2019-09-30 ENCOUNTER — Encounter: Payer: Self-pay | Admitting: Internal Medicine

## 2019-09-30 DIAGNOSIS — R55 Syncope and collapse: Secondary | ICD-10-CM | POA: Diagnosis not present

## 2019-10-04 DIAGNOSIS — I429 Cardiomyopathy, unspecified: Secondary | ICD-10-CM | POA: Insufficient documentation

## 2019-10-05 DIAGNOSIS — M79604 Pain in right leg: Secondary | ICD-10-CM | POA: Diagnosis not present

## 2019-10-05 DIAGNOSIS — G8929 Other chronic pain: Secondary | ICD-10-CM | POA: Diagnosis not present

## 2019-10-05 DIAGNOSIS — I743 Embolism and thrombosis of arteries of the lower extremities: Secondary | ICD-10-CM | POA: Diagnosis not present

## 2019-10-05 DIAGNOSIS — I77811 Abdominal aortic ectasia: Secondary | ICD-10-CM | POA: Diagnosis not present

## 2019-10-05 DIAGNOSIS — F17200 Nicotine dependence, unspecified, uncomplicated: Secondary | ICD-10-CM | POA: Diagnosis not present

## 2019-10-05 DIAGNOSIS — I70221 Atherosclerosis of native arteries of extremities with rest pain, right leg: Secondary | ICD-10-CM | POA: Diagnosis not present

## 2019-10-05 DIAGNOSIS — I771 Stricture of artery: Secondary | ICD-10-CM | POA: Diagnosis not present

## 2019-10-05 DIAGNOSIS — I214 Non-ST elevation (NSTEMI) myocardial infarction: Secondary | ICD-10-CM | POA: Diagnosis not present

## 2019-10-05 DIAGNOSIS — I429 Cardiomyopathy, unspecified: Secondary | ICD-10-CM | POA: Diagnosis not present

## 2019-10-05 DIAGNOSIS — K219 Gastro-esophageal reflux disease without esophagitis: Secondary | ICD-10-CM | POA: Diagnosis not present

## 2019-10-05 DIAGNOSIS — Z8249 Family history of ischemic heart disease and other diseases of the circulatory system: Secondary | ICD-10-CM | POA: Diagnosis not present

## 2019-10-05 DIAGNOSIS — I739 Peripheral vascular disease, unspecified: Secondary | ICD-10-CM | POA: Diagnosis not present

## 2019-10-05 DIAGNOSIS — Z8679 Personal history of other diseases of the circulatory system: Secondary | ICD-10-CM | POA: Diagnosis not present

## 2019-10-05 DIAGNOSIS — R110 Nausea: Secondary | ICD-10-CM | POA: Diagnosis not present

## 2019-10-05 DIAGNOSIS — F419 Anxiety disorder, unspecified: Secondary | ICD-10-CM | POA: Diagnosis not present

## 2019-10-05 DIAGNOSIS — I70201 Unspecified atherosclerosis of native arteries of extremities, right leg: Secondary | ICD-10-CM | POA: Diagnosis not present

## 2019-10-05 DIAGNOSIS — F17210 Nicotine dependence, cigarettes, uncomplicated: Secondary | ICD-10-CM | POA: Diagnosis not present

## 2019-10-05 DIAGNOSIS — I10 Essential (primary) hypertension: Secondary | ICD-10-CM | POA: Diagnosis not present

## 2019-10-05 DIAGNOSIS — R55 Syncope and collapse: Secondary | ICD-10-CM | POA: Diagnosis not present

## 2019-10-05 DIAGNOSIS — Z66 Do not resuscitate: Secondary | ICD-10-CM | POA: Diagnosis not present

## 2019-10-05 DIAGNOSIS — I70209 Unspecified atherosclerosis of native arteries of extremities, unspecified extremity: Secondary | ICD-10-CM | POA: Insufficient documentation

## 2019-10-05 DIAGNOSIS — M79606 Pain in leg, unspecified: Secondary | ICD-10-CM | POA: Diagnosis not present

## 2019-10-05 DIAGNOSIS — Z720 Tobacco use: Secondary | ICD-10-CM | POA: Diagnosis not present

## 2019-10-10 ENCOUNTER — Other Ambulatory Visit: Payer: Self-pay | Admitting: Internal Medicine

## 2019-10-10 DIAGNOSIS — I429 Cardiomyopathy, unspecified: Secondary | ICD-10-CM | POA: Diagnosis not present

## 2019-10-10 DIAGNOSIS — I70201 Unspecified atherosclerosis of native arteries of extremities, right leg: Secondary | ICD-10-CM | POA: Diagnosis not present

## 2019-10-10 DIAGNOSIS — R55 Syncope and collapse: Secondary | ICD-10-CM | POA: Diagnosis not present

## 2019-10-10 DIAGNOSIS — F419 Anxiety disorder, unspecified: Secondary | ICD-10-CM | POA: Diagnosis not present

## 2019-10-10 DIAGNOSIS — R778 Other specified abnormalities of plasma proteins: Secondary | ICD-10-CM | POA: Diagnosis not present

## 2019-10-10 DIAGNOSIS — I10 Essential (primary) hypertension: Secondary | ICD-10-CM | POA: Diagnosis not present

## 2019-10-10 DIAGNOSIS — F411 Generalized anxiety disorder: Secondary | ICD-10-CM

## 2019-10-10 DIAGNOSIS — Z720 Tobacco use: Secondary | ICD-10-CM | POA: Diagnosis not present

## 2019-10-10 NOTE — Telephone Encounter (Signed)
rx sent to pharmacy by e-script  

## 2019-10-11 HISTORY — PX: CARDIAC CATHETERIZATION: SHX172

## 2019-10-20 ENCOUNTER — Ambulatory Visit (INDEPENDENT_AMBULATORY_CARE_PROVIDER_SITE_OTHER): Payer: Medicare Other | Admitting: Internal Medicine

## 2019-10-20 ENCOUNTER — Encounter: Payer: Self-pay | Admitting: Internal Medicine

## 2019-10-20 ENCOUNTER — Other Ambulatory Visit: Payer: Self-pay

## 2019-10-20 DIAGNOSIS — I429 Cardiomyopathy, unspecified: Secondary | ICD-10-CM

## 2019-10-20 DIAGNOSIS — M792 Neuralgia and neuritis, unspecified: Secondary | ICD-10-CM | POA: Diagnosis not present

## 2019-10-20 DIAGNOSIS — R1011 Right upper quadrant pain: Secondary | ICD-10-CM

## 2019-10-20 DIAGNOSIS — I739 Peripheral vascular disease, unspecified: Secondary | ICD-10-CM | POA: Diagnosis not present

## 2019-10-20 DIAGNOSIS — F1010 Alcohol abuse, uncomplicated: Secondary | ICD-10-CM

## 2019-10-20 DIAGNOSIS — G8929 Other chronic pain: Secondary | ICD-10-CM

## 2019-10-20 DIAGNOSIS — F17200 Nicotine dependence, unspecified, uncomplicated: Secondary | ICD-10-CM

## 2019-10-20 MED ORDER — GABAPENTIN 100 MG PO CAPS: 100.0000 mg | ORAL_CAPSULE | Freq: Three times a day (TID) | ORAL | 3 refills | Status: DC

## 2019-10-20 NOTE — Progress Notes (Signed)
This service is provided via telemedicine  No vital signs collected/recorded due to the encounter was a telemedicine visit.   Location of patient (ex: home, work):  Home   Patient consents to a telephone visit:  Yes  Location of the provider (ex: office, home):  Office   Name of any referring provider:  N/A  Names of all persons participating in the telemedicine service and their role in the encounter:  Hollace Kinnier, D.O., Ruthell Rummage CMA, and  patient   Time spent on call:  Ruthell Rummage CMA spent 7 minutes on phone with patient     Provider: Dessie Delcarlo L. Mariea Clonts, D.O., C.M.D.  Goals of Care:  Advanced Directives 10/20/2019  Does Patient Have a Medical Advance Directive? No  Would patient like information on creating a medical advance directive? No - Patient declined     Chief Complaint  Patient presents with  . Acute Visit    Patient requesting pain medication increase patient states she just had surgery and patient's mom has fallen 10 times     HPI: Patient is a 64 y.o. female spoke with by phone today for an acute visit for  [facility] with nausea and vomiting.  Had syncopal episode, called 911 b/c snow in driveway so couldn't drive.  Took her from there to [place], [place] where she had a cath.  Hear was only working 36% and suggested a vest but then not covered unless below 35%.  Clip came out and plaque went down leg from femoral.  Then had surgery for her leg.  Girl comes two times a week to care for her parents and Tali is there there the rest of the time.  She has an MRI of her heart 9/26.  She's having to pick up her mom.  She's been on her same pain meds from here and not taking any from anywhere else.    Right now, the right leg and her usual right chest/abdomen and her right groin.  Pain in the leg is burning pain.    She says she's not drinking.  She is still smoking a little bit though she knows she needs to quit.    Says incision site is hard--advised to keep  appt:  10/21/19 at 8:15 am with Dr. Logan Bores at [facility]  Past Medical History:  Diagnosis Date  . Abdominal pain 2000   chronic abdominal pain with nausea and vomiting  . Abnormal liver function   . Allergy   . Hypertension     Past Surgical History:  Procedure Laterality Date  . ABDOMINAL HYSTERECTOMY    . CARDIAC CATHETERIZATION  10/11/2019  . CHOLECYSTECTOMY      Allergies  Allergen Reactions  . Avelox [Moxifloxacin Hcl In Nacl]     Low blood pressure  . Compazine [Prochlorperazine Edisylate] Other (See Comments)    Caused lockjaw  . Ibuprofen Hives  . Morphine And Related Nausea And Vomiting  . Promethazine Hcl Nausea And Vomiting    Outpatient Encounter Medications as of 10/20/2019  Medication Sig  . aspirin 81 MG chewable tablet Chew 162 mg by mouth daily.  . Biotin 1 MG CAPS Take 1 mg by mouth daily at 6 (six) AM.  . Cholecalciferol (VITAMIN D) 2000 UNITS CAPS Take 1 capsule (2,000 Units total) by mouth daily.  Marland Kitchen escitalopram (LEXAPRO) 20 MG tablet take 1 tablet by mouth once daily  . famotidine (PEPCID) 40 MG tablet Take 1 tablet (40 mg total) by mouth  daily.  . lidocaine (LIDODERM) 5 % Place 1 patch onto the skin daily. Remove & Discard patch within 12 hours or as directed by MD  . lisinopril (ZESTRIL) 20 MG tablet take 1 tablet by mouth once daily  . LORazepam (ATIVAN) 0.5 MG tablet take 1 tablet by mouth every 6 hours if needed  . metoprolol tartrate (LOPRESSOR) 25 MG tablet Take 25 mg by mouth daily.  . Oxycodone HCl 10 MG TABS take 1 tablet by mouth every 6 hours if needed for pain  . simvastatin (ZOCOR) 20 MG tablet take 1 tablet by mouth at bedtime  . valACYclovir (VALTREX) 500 MG tablet take 1 tablet by mouth twice a day  . zolpidem (AMBIEN) 10 MG tablet take 1 tablet by mouth at bedtime for sleep  . [DISCONTINUED] Na Sulfate-K Sulfate-Mg Sulf 17.5-3.13-1.6 GM/177ML SOLN Take by mouth.   No facility-administered encounter medications on file  as of 10/20/2019.    Review of Systems:  Review of Systems  Constitutional: Positive for malaise/fatigue. Negative for chills and fever.       Wt up to 120 she says  HENT: Negative for hearing loss.   Eyes: Negative for blurred vision.  Respiratory: Negative for cough and shortness of breath.   Cardiovascular: Positive for chest pain and leg swelling. Negative for palpitations.  Gastrointestinal: Positive for abdominal pain. Negative for constipation, nausea and vomiting.  Genitourinary: Negative for dysuria.  Musculoskeletal: Negative for falls.       Right rib pain  Neurological: Positive for loss of consciousness. Negative for dizziness.  Endo/Heme/Allergies: Bruises/bleeds easily.  Psychiatric/Behavioral: Negative for depression and memory loss. The patient is nervous/anxious and has insomnia.        Caregiver stress    Health Maintenance  Topic Date Due  . PNA vac Low Risk Adult (2 of 2 - PPSV23) 09/19/2018  . MAMMOGRAM  04/12/2020  . COLONOSCOPY  06/30/2023  . TETANUS/TDAP  09/27/2025  . INFLUENZA VACCINE  Completed  . DEXA SCAN  Completed  . Hepatitis C Screening  Completed    Physical Exam: This portion of visit could not be done due to non face-to-face visit (telehealth)  Labs reviewed: Basic Metabolic Panel: Recent Labs    11/08/18 1117 09/28/19 0000  NA 139  --   K 4.6  --   CL 103  --   CO2 29 22  GLUCOSE 96  --   BUN 15 19  CREATININE 0.67 1.1  CALCIUM 9.9  --    Liver Function Tests: Recent Labs    11/08/18 1117 09/28/19 0000  AST 12 25  ALT 11 26  BILITOT 0.3  --   PROT 6.3  --    No results for input(s): LIPASE, AMYLASE in the last 8760 hours. No results for input(s): AMMONIA in the last 8760 hours. CBC: Recent Labs    11/08/18 1117 09/28/19 0000  WBC 6.6 13.1  NEUTROABS 3,854  --   HGB 13.3 14.9  HCT 39.8 46  MCV 96.6  --   PLT 415* 346   Lipid Panel: Recent Labs    11/08/18 1117  CHOL 198  HDL 53  LDLCALC 118*  TRIG  153*  CHOLHDL 3.7   Lab Results  Component Value Date   HGBA1C 5.4 11/08/2018    Procedures since last visit: Reviewed echo, cath, endarterectomy, hospital records from [facility] in [place] and [place]  Assessment/Plan 1. Neuropathic pain, leg, right - likely more related to arterial circulation returning after endarterectomy -  will give gabapentin but this will be temporary for her - gabapentin (NEURONTIN) 100 MG capsule; Take 1 capsule (100 mg total) by mouth 3 (three) times daily.  Dispense: 90 capsule; Refill: 3  2. PAD (peripheral artery disease) (HCC) -s/p endarterectomy right femoral artery on 10/06/19 -reports incision in leg is very hard and it concerns her--has f/u tomorrow with vascular surgery there and advised to keep this appt and ask about her concern   3. Cardiomyopathy, unspecified type ([place]) -newly identified--had syncope and ST elevation and elevated troponin but cath negative for significant CAD, EF wound up being 36% and there are plans in [place] to get her an MRI to see if she will then qualify for a vest  -she is to quit drinking and smoking -has quit drinking, she says, but still smoking  4. Chronic RUQ pain -longstanding and ongoing since cholecystitis and cholecystectomy -remains on her longstanding oxycodone therapy from here and does have contract -needs f/u here asap when returns to [place] from [place]  5. Tobacco use disorder -ongoing, knows she should quit, counseling provided for more than 3 mins today on importance of quitting with her known arterial disease peripherally and low pump function of her heart  6. Alcohol abuse -suspect this may have been more significant and could explain her reduced EF -currently reports not drinking  Labs/tests ordered:  None as pt not here in [place]--in [place] caring for her parents Next appt:  F/u here asap when back in [place] Non face-to-face time spent on televisit:  30 mins  Peyton Rossner L. Daeshaun Specht, D.O. [facility] [address], [place] [address] Cell Phone (Mon-Fri 8am-5pm):  [phone] On Call:  [phone] & follow prompts after 5pm & weekends Office Phone:  [phone] Office Fax:  [phone]

## 2019-10-21 DIAGNOSIS — I70209 Unspecified atherosclerosis of native arteries of extremities, unspecified extremity: Secondary | ICD-10-CM | POA: Diagnosis not present

## 2019-10-24 ENCOUNTER — Other Ambulatory Visit: Payer: Self-pay | Admitting: Internal Medicine

## 2019-10-24 DIAGNOSIS — G8929 Other chronic pain: Secondary | ICD-10-CM

## 2019-10-24 DIAGNOSIS — R1011 Right upper quadrant pain: Secondary | ICD-10-CM

## 2019-10-27 ENCOUNTER — Other Ambulatory Visit: Payer: Self-pay | Admitting: Internal Medicine

## 2019-10-27 ENCOUNTER — Other Ambulatory Visit: Payer: Self-pay | Admitting: *Deleted

## 2019-10-27 DIAGNOSIS — G8929 Other chronic pain: Secondary | ICD-10-CM

## 2019-10-27 DIAGNOSIS — F5101 Primary insomnia: Secondary | ICD-10-CM

## 2019-10-27 MED ORDER — OXYCODONE HCL 10 MG PO TABS: ORAL_TABLET | ORAL | 0 refills | Status: DC

## 2019-10-27 MED ORDER — ZOLPIDEM TARTRATE 10 MG PO TABS: ORAL_TABLET | ORAL | 0 refills | Status: DC

## 2019-10-27 MED ORDER — METOPROLOL SUCCINATE ER 25 MG PO TB24: 25.0000 mg | ORAL_TABLET | Freq: Every day | ORAL | 1 refills | Status: DC

## 2019-10-27 NOTE — Telephone Encounter (Signed)
Received refill Request from pharmacy Pended Rx and sent to Dr. Mariea Clonts for approval [place] [facility] Verified LR: 09/29/2019

## 2019-11-14 ENCOUNTER — Other Ambulatory Visit: Payer: Self-pay | Admitting: Internal Medicine

## 2019-11-23 ENCOUNTER — Other Ambulatory Visit: Payer: Self-pay | Admitting: Internal Medicine

## 2019-11-23 DIAGNOSIS — B009 Herpesviral infection, unspecified: Secondary | ICD-10-CM

## 2019-11-24 ENCOUNTER — Other Ambulatory Visit: Payer: Self-pay | Admitting: *Deleted

## 2019-11-24 DIAGNOSIS — F5101 Primary insomnia: Secondary | ICD-10-CM

## 2019-11-24 DIAGNOSIS — G8929 Other chronic pain: Secondary | ICD-10-CM

## 2019-11-24 DIAGNOSIS — R1011 Right upper quadrant pain: Secondary | ICD-10-CM

## 2019-11-24 MED ORDER — ZOLPIDEM TARTRATE 10 MG PO TABS: ORAL_TABLET | ORAL | 0 refills | Status: DC

## 2019-11-24 MED ORDER — OXYCODONE HCL 10 MG PO TABS: ORAL_TABLET | ORAL | 0 refills | Status: DC

## 2019-11-24 NOTE — Telephone Encounter (Signed)
Received fax from [place] Verified LR: 10/28/2019 Pended Rx and sent to Dr. Mariea Clonts for approval.

## 2019-12-05 ENCOUNTER — Other Ambulatory Visit: Payer: Self-pay | Admitting: Internal Medicine

## 2019-12-05 DIAGNOSIS — F439 Reaction to severe stress, unspecified: Secondary | ICD-10-CM

## 2019-12-19 DIAGNOSIS — R778 Other specified abnormalities of plasma proteins: Secondary | ICD-10-CM | POA: Diagnosis not present

## 2019-12-19 DIAGNOSIS — I10 Essential (primary) hypertension: Secondary | ICD-10-CM | POA: Diagnosis not present

## 2019-12-19 DIAGNOSIS — F419 Anxiety disorder, unspecified: Secondary | ICD-10-CM | POA: Diagnosis not present

## 2019-12-19 DIAGNOSIS — Z720 Tobacco use: Secondary | ICD-10-CM | POA: Diagnosis not present

## 2019-12-19 DIAGNOSIS — R55 Syncope and collapse: Secondary | ICD-10-CM | POA: Diagnosis not present

## 2019-12-19 DIAGNOSIS — I429 Cardiomyopathy, unspecified: Secondary | ICD-10-CM | POA: Diagnosis not present

## 2019-12-19 DIAGNOSIS — I70201 Unspecified atherosclerosis of native arteries of extremities, right leg: Secondary | ICD-10-CM | POA: Diagnosis not present

## 2019-12-20 ENCOUNTER — Other Ambulatory Visit: Payer: Self-pay | Admitting: Internal Medicine

## 2019-12-23 ENCOUNTER — Other Ambulatory Visit: Payer: Self-pay | Admitting: *Deleted

## 2019-12-23 DIAGNOSIS — G8929 Other chronic pain: Secondary | ICD-10-CM

## 2019-12-23 DIAGNOSIS — F5101 Primary insomnia: Secondary | ICD-10-CM

## 2019-12-23 MED ORDER — ZOLPIDEM TARTRATE 10 MG PO TABS: ORAL_TABLET | ORAL | 0 refills | Status: DC

## 2019-12-23 MED ORDER — OXYCODONE HCL 10 MG PO TABS: ORAL_TABLET | ORAL | 0 refills | Status: DC

## 2019-12-23 NOTE — Telephone Encounter (Signed)
Patient requested refills [place] Verified LR: 11/25/2019 Pended Rx and sent to [facility] for approval, Dr. Mariea Clonts out of office.

## 2019-12-27 ENCOUNTER — Other Ambulatory Visit: Payer: Self-pay | Admitting: Internal Medicine

## 2019-12-27 DIAGNOSIS — G8929 Other chronic pain: Secondary | ICD-10-CM

## 2019-12-27 DIAGNOSIS — R1011 Right upper quadrant pain: Secondary | ICD-10-CM

## 2019-12-30 ENCOUNTER — Telehealth: Payer: Self-pay

## 2019-12-30 NOTE — Telephone Encounter (Signed)
Patient call to request her dose of Gabapentin her legs are killing her. Also she wanted you to know her father passed away. Please advise

## 2019-12-30 NOTE — Telephone Encounter (Signed)
I'm sorry to hear about her father.   As far as gabapentin, I'm a little concerned about her needing a higher dose considering she just had a blocked artery in her one leg.  Not seeing her myself, I worry we'll be masking a bigger circulation problem by increasing the gabapentin.  I'd prefer she get looked at where she is.

## 2020-01-02 NOTE — Telephone Encounter (Signed)
Patient has an appointment on Wednesday with her cardiologist and will ask him about it . She will call back with and update.

## 2020-01-10 DIAGNOSIS — Z79899 Other long term (current) drug therapy: Secondary | ICD-10-CM | POA: Diagnosis not present

## 2020-01-10 DIAGNOSIS — Z9049 Acquired absence of other specified parts of digestive tract: Secondary | ICD-10-CM | POA: Diagnosis not present

## 2020-01-10 DIAGNOSIS — R1011 Right upper quadrant pain: Secondary | ICD-10-CM | POA: Diagnosis not present

## 2020-01-10 DIAGNOSIS — F419 Anxiety disorder, unspecified: Secondary | ICD-10-CM | POA: Diagnosis not present

## 2020-01-10 DIAGNOSIS — K219 Gastro-esophageal reflux disease without esophagitis: Secondary | ICD-10-CM | POA: Diagnosis not present

## 2020-01-10 DIAGNOSIS — R1110 Vomiting, unspecified: Secondary | ICD-10-CM | POA: Diagnosis not present

## 2020-01-10 DIAGNOSIS — Z7982 Long term (current) use of aspirin: Secondary | ICD-10-CM | POA: Diagnosis not present

## 2020-01-10 DIAGNOSIS — I10 Essential (primary) hypertension: Secondary | ICD-10-CM | POA: Diagnosis not present

## 2020-01-16 DIAGNOSIS — I70203 Unspecified atherosclerosis of native arteries of extremities, bilateral legs: Secondary | ICD-10-CM | POA: Diagnosis not present

## 2020-01-16 DIAGNOSIS — Z720 Tobacco use: Secondary | ICD-10-CM | POA: Diagnosis not present

## 2020-01-16 DIAGNOSIS — M79605 Pain in left leg: Secondary | ICD-10-CM | POA: Diagnosis not present

## 2020-01-16 DIAGNOSIS — I10 Essential (primary) hypertension: Secondary | ICD-10-CM | POA: Diagnosis not present

## 2020-01-16 DIAGNOSIS — I70209 Unspecified atherosclerosis of native arteries of extremities, unspecified extremity: Secondary | ICD-10-CM | POA: Diagnosis not present

## 2020-01-19 ENCOUNTER — Other Ambulatory Visit: Payer: Self-pay | Admitting: *Deleted

## 2020-01-19 DIAGNOSIS — F5101 Primary insomnia: Secondary | ICD-10-CM

## 2020-01-19 DIAGNOSIS — G8929 Other chronic pain: Secondary | ICD-10-CM

## 2020-01-19 DIAGNOSIS — R1011 Right upper quadrant pain: Secondary | ICD-10-CM

## 2020-01-19 MED ORDER — OXYCODONE HCL 10 MG PO TABS: ORAL_TABLET | ORAL | 0 refills | Status: DC

## 2020-01-19 MED ORDER — ZOLPIDEM TARTRATE 10 MG PO TABS: ORAL_TABLET | ORAL | 0 refills | Status: DC

## 2020-01-19 NOTE — Telephone Encounter (Signed)
Patient requested refill Also stated that her father passed away about 2 weeks ago.   [place] Verified LR: 01/20/2020 Pended Rxs and sent to Dr. Mariea Clonts for approval.

## 2020-01-25 DIAGNOSIS — I70201 Unspecified atherosclerosis of native arteries of extremities, right leg: Secondary | ICD-10-CM | POA: Diagnosis not present

## 2020-01-25 DIAGNOSIS — G8929 Other chronic pain: Secondary | ICD-10-CM | POA: Diagnosis not present

## 2020-01-25 DIAGNOSIS — Z720 Tobacco use: Secondary | ICD-10-CM | POA: Diagnosis not present

## 2020-01-25 DIAGNOSIS — I429 Cardiomyopathy, unspecified: Secondary | ICD-10-CM | POA: Diagnosis not present

## 2020-01-25 DIAGNOSIS — I70209 Unspecified atherosclerosis of native arteries of extremities, unspecified extremity: Secondary | ICD-10-CM | POA: Diagnosis not present

## 2020-01-25 DIAGNOSIS — I10 Essential (primary) hypertension: Secondary | ICD-10-CM | POA: Diagnosis not present

## 2020-01-28 ENCOUNTER — Other Ambulatory Visit: Payer: Self-pay | Admitting: Internal Medicine

## 2020-01-28 DIAGNOSIS — B009 Herpesviral infection, unspecified: Secondary | ICD-10-CM

## 2020-02-14 ENCOUNTER — Other Ambulatory Visit: Payer: Self-pay

## 2020-02-14 DIAGNOSIS — M792 Neuralgia and neuritis, unspecified: Secondary | ICD-10-CM

## 2020-02-14 MED ORDER — GABAPENTIN 100 MG PO CAPS: 100.0000 mg | ORAL_CAPSULE | Freq: Three times a day (TID) | ORAL | 3 refills | Status: DC

## 2020-02-14 NOTE — Telephone Encounter (Signed)
Patient call for a refill on her gabapentin, she is at the pharmacy

## 2020-02-17 ENCOUNTER — Other Ambulatory Visit: Payer: Self-pay | Admitting: *Deleted

## 2020-02-17 DIAGNOSIS — R1011 Right upper quadrant pain: Secondary | ICD-10-CM

## 2020-02-17 DIAGNOSIS — F5101 Primary insomnia: Secondary | ICD-10-CM

## 2020-02-17 DIAGNOSIS — G8929 Other chronic pain: Secondary | ICD-10-CM

## 2020-02-17 MED ORDER — ZOLPIDEM TARTRATE 10 MG PO TABS: ORAL_TABLET | ORAL | 0 refills | Status: DC

## 2020-02-17 MED ORDER — OXYCODONE HCL 10 MG PO TABS: ORAL_TABLET | ORAL | 0 refills | Status: DC

## 2020-02-17 NOTE — Telephone Encounter (Signed)
Received refill Request from pharmacy Pended Rx and sent to [facility] for approval. (Dr. Mariea Clonts out of office) Epic LR: 01/19/20

## 2020-03-13 ENCOUNTER — Other Ambulatory Visit: Payer: Self-pay | Admitting: Nurse Practitioner

## 2020-03-13 DIAGNOSIS — R1011 Right upper quadrant pain: Secondary | ICD-10-CM

## 2020-03-13 DIAGNOSIS — F5101 Primary insomnia: Secondary | ICD-10-CM

## 2020-03-13 DIAGNOSIS — G8929 Other chronic pain: Secondary | ICD-10-CM

## 2020-03-13 NOTE — Telephone Encounter (Signed)
Received refill request from pharmacy Pended Rx and sent to Dr. Reed for approval.  

## 2020-03-15 ENCOUNTER — Telehealth: Payer: Self-pay | Admitting: *Deleted

## 2020-03-15 NOTE — Telephone Encounter (Signed)
Patient notified and agreed. TeleVisit Scheduled for 03/16/20 with Dinah.

## 2020-03-15 NOTE — Telephone Encounter (Signed)
Patient called and stated that she is Depressed. Stated that her mother is in the nursing home for 3 weeks and her Father has passed away. Patient is hoping to get home soon to follow up but is wondering if Trintellix can be added to help with the depression. Stated that she is using her Lexapro but is also wanting to add the Trintellix.   Please Advise.

## 2020-03-15 NOTE — Telephone Encounter (Signed)
Let's schedule her for a virtual visit to address her depression with the NP

## 2020-03-16 ENCOUNTER — Other Ambulatory Visit: Payer: Self-pay

## 2020-03-16 ENCOUNTER — Telehealth: Payer: Self-pay

## 2020-03-16 ENCOUNTER — Telehealth (INDEPENDENT_AMBULATORY_CARE_PROVIDER_SITE_OTHER): Payer: Medicare Other | Admitting: Family

## 2020-03-16 ENCOUNTER — Encounter: Payer: Self-pay | Admitting: Family

## 2020-03-16 DIAGNOSIS — F331 Major depressive disorder, recurrent, moderate: Secondary | ICD-10-CM

## 2020-03-16 DIAGNOSIS — F439 Reaction to severe stress, unspecified: Secondary | ICD-10-CM | POA: Diagnosis not present

## 2020-03-16 MED ORDER — ESCITALOPRAM OXALATE 20 MG PO TABS: ORAL_TABLET | ORAL | 0 refills | Status: DC

## 2020-03-16 MED ORDER — VORTIOXETINE HBR 5 MG PO TABS: ORAL_TABLET | ORAL | 0 refills | Status: DC

## 2020-03-16 NOTE — Telephone Encounter (Signed)
[name], gentles are scheduled for a virtual visit with your provider today.    Just as we do with appointments in the office, we must obtain your consent to participate.  Your consent will be active for this visit and any virtual visit you may have with one of our providers in the next 365 days.    If you have a MyChart account, I can also send a copy of this consent to you electronically.  All virtual visits are billed to your insurance company just like a traditional visit in the office.  As this is a virtual visit, video technology does not allow for your provider to perform a traditional examination.  This may limit your provider's ability to fully assess your condition.  If your provider identifies any concerns that need to be evaluated in person or the need to arrange testing such as labs, EKG, etc, we will make arrangements to do so.    Although advances in technology are sophisticated, we cannot ensure that it will always work on either your end or our end.  If the connection with a video visit is poor, we may have to switch to a telephone visit.  With either a video or telephone visit, we are not always able to ensure that we have a secure connection.   I need to obtain your verbal consent now.   Are you willing to proceed with your visit today?   Theresa Carey has provided verbal consent on 03/16/2020 for a virtual visit (video or telephone).   [place], [place] 03/16/2020  8:21 AM

## 2020-03-16 NOTE — Progress Notes (Signed)
This service is provided via telemedicine  No vital signs collected/recorded due to the encounter was a telemedicine visit.   Location of patient (ex: home, work): Home.  Patient consents to a telephone visit: Yes.  Location of the provider (ex: office, home):  [facility]  Name of any referring provider: N/A  Names of all persons participating in the telemedicine service and their role in the encounter: Patient, Heriberto Antigua, [place], [place], Webb Silversmith, NP.    Time spent on call: 8 minutes spent on the phone with Medical Assistant.   Provider: Lian Pounds FNP-C  Gayland Curry, DO  Patient Care Team: Gayland Curry, DO as PCP - General (Geriatric Medicine)  Extended Emergency Contact Information Primary Emergency Contact: Doylene Canning, [place] [address] [place] Phone: [phone] Relation: Friend Secondary Emergency Contact: Thea Alken [place] Mobile Phone: [phone] Relation: Father  Code Status:  Full code  Goals of care: Advanced Directive information Advanced Directives 03/16/2020  Does Patient Have a Medical Advance Directive? No  Would patient like information on creating a medical advance directive? No - Patient declined     Chief Complaint  Patient presents with  . Acute Visit    Complains of worsening Depression.    HPI:  Pt is a 64 y.o. female seen today for an acute visit for evaluation of worsening depression.she states currently in [place] taking care of her mother though states her mother is in a Rehab facility now.States her sister wants to put her mother in a Nursing facility which she doesn't agree with her since she has worked in [employer] and doesn't like it.Her father recently died.she went to [facility] to take care of both parents and does not plan to return until her mother passes away.Has had worsening depression due to death of the father.Not eating well states has lost  weight.Has anxiety every now and then and sleeping well.Gets easily irritated. Has no thoughts of suicide,harming self or others.she would like to get off Lexapro states has been on it for many,many years.Not working any more for her.she request Trintellix to be order for her.I've discussed with her that she will have to wean lexapro and not stop abruptly to prevent worsening of symptoms of  depression   Past Medical History:  Diagnosis Date  . Abdominal pain 2000   chronic abdominal pain with nausea and vomiting  . Abnormal liver function   . Allergy   . Hypertension    Past Surgical History:  Procedure Laterality Date  . ABDOMINAL HYSTERECTOMY    . CARDIAC CATHETERIZATION  10/11/2019  . CHOLECYSTECTOMY      Allergies  Allergen Reactions  . Avelox [Moxifloxacin Hcl In Nacl]     Low blood pressure  . Compazine [Prochlorperazine Edisylate] Other (See Comments)    Caused lockjaw  . Ibuprofen Hives  . Morphine And Related Nausea And Vomiting  . Promethazine Hcl Nausea And Vomiting    Outpatient Encounter Medications as of 03/16/2020  Medication Sig  . aspirin 81 MG chewable tablet Chew 81 mg by mouth 2 (two) times daily.   . Biotin 1 MG CAPS Take 1 mg by mouth daily at 6 (six) AM.  . Cholecalciferol (VITAMIN D) 2000 UNITS CAPS Take 1 capsule (2,000 Units total) by mouth daily.  Marland Kitchen escitalopram (LEXAPRO) 20 MG tablet take 1 tablet by mouth once daily  . famotidine (PEPCID) 40 MG tablet take  1 tablet by mouth once daily  . gabapentin (NEURONTIN) 100 MG capsule Take 1 capsule (100 mg total) by mouth 3 (three) times daily.  Marland Kitchen lisinopril (ZESTRIL) 20 MG tablet Take 1 tablet (20 mg total) by mouth daily.  Marland Kitchen LORazepam (ATIVAN) 0.5 MG tablet take 1 tablet by mouth every 6 hours if needed  . metoprolol succinate (TOPROL-XL) 25 MG 24 hr tablet Take 25 mg by mouth 2 (two) times daily. Morning and Night.  . Oxycodone HCl 10 MG TABS take 1 tablet by mouth every 6 hours if needed for pain  .  simvastatin (ZOCOR) 20 MG tablet take 1 tablet by mouth at bedtime  . valACYclovir (VALTREX) 500 MG tablet take 1 tablet by mouth twice a day  . zolpidem (AMBIEN) 10 MG tablet take 1 tablet by mouth at bedtime for sleep  . [DISCONTINUED] lidocaine (LIDODERM) 5 % Place 1 patch onto the skin daily. Remove & Discard patch within 12 hours or as directed by MD  . [DISCONTINUED] metoprolol succinate (TOPROL-XL) 25 MG 24 hr tablet Take 1 tablet (25 mg total) by mouth daily.  . [DISCONTINUED] metoprolol tartrate (LOPRESSOR) 25 MG tablet Take 25 mg by mouth daily.   No facility-administered encounter medications on file as of 03/16/2020.    Review of Systems  Constitutional: Negative for chills, fatigue and fever.       Not eating well   HENT: Negative for congestion, rhinorrhea, sinus pressure, sinus pain, sneezing, sore throat and trouble swallowing.   Respiratory: Negative for cough, chest tightness, shortness of breath and wheezing.   Cardiovascular: Negative for chest pain, palpitations and leg swelling.  Gastrointestinal: Negative for abdominal distention, abdominal pain, constipation, diarrhea, nausea and vomiting.  Neurological: Negative for dizziness, speech difficulty, weakness, light-headedness, numbness and headaches.  Hematological: Does not bruise/bleed easily.  Psychiatric/Behavioral: Negative for agitation, confusion, self-injury and suicidal ideas. The patient is not nervous/anxious.        Anxiety once every now and then.worsening depression with recent death of the father.Her mother currently in rehab.     Immunization History  Administered Date(s) Administered  . Influenza Inj Mdck Quad Pf 06/16/2017  . Influenza Split 08/15/2012  . Influenza Whole 07/02/2013  . Influenza,inj,Quad PF,6+ Mos 09/28/2015  . Influenza-Unspecified 05/22/2014, 06/21/2016, 06/04/2018, 06/10/2019  . Moderna SARS-COVID-2 Vaccination 10/27/2019, 11/25/2019  . Pneumococcal Conjugate-13 08/31/2014,  08/31/2014  . Pneumococcal Polysaccharide-23 08/15/2012  . Tdap 09/28/2015   Pertinent  Health Maintenance Due  Topic Date Due  . PNA vac Low Risk Adult (2 of 2 - PPSV23) 09/19/2018  . INFLUENZA VACCINE  03/25/2020  . MAMMOGRAM  04/12/2020  . COLONOSCOPY  06/30/2023  . DEXA SCAN  Completed   Fall Risk  07/29/2019 03/07/2019 11/08/2018 07/05/2018 02/22/2018  Falls in the past year? 0 1 0 1 Yes  Number falls in past yr: - 0 0 0 1  Injury with Fall? 0 0 0 0 Yes   There were no vitals filed for this visit. There is no height or weight on file to calculate BMI. Physical Exam  Unable to complete on telephone visit.   Labs reviewed: Recent Labs    09/28/19 0000  CO2 22  BUN 19  CREATININE 1.1   Recent Labs    09/28/19 0000  AST 25  ALT 26   Recent Labs    09/28/19 0000  WBC 13.1  HGB 14.9  HCT 46  PLT 346    Lab Results  Component Value Date   HGBA1C  5.4 11/08/2018   Lab Results  Component Value Date   CHOL 198 11/08/2018   HDL 53 11/08/2018   LDLCALC 118 (H) 11/08/2018   TRIG 153 (H) 11/08/2018   CHOLHDL 3.7 11/08/2018    Significant Diagnostic Results in last 30 days:  No results found.  Assessment/Plan 1. Stress Care giver burden taking care of Elderly mother.Recent death of the father.Had to move temporarily to [place] to take care of the parents.Also having disagreement with her sister whether to place mother in an skilled Nursing facility which she doesn't like it.   - escitalopram (LEXAPRO) 20 MG tablet; Take 10 mg tablet one by mouth daily x 7 days then  Take 5 mg tablet (1/2 Tablet) by mouth daily x 7 days then  Take 5 mg tablet one by mouth every other day x 7 days then stop and start Trintellix as directed.  Dispense: 30 tablet; Refill: 0  2. Moderate episode of recurrent major depressive disorder (HCC) Worsening symptoms thinks lexapro no longer working for her request Trintellix.Advised to wean off lexapro as below and then start on  Trintellix.  - Advised to notify provider or go to ED if depression symptoms worsen. - side effects discussed and additional educational information for Trintellix provided on AVS to be mailed by CMA.   - escitalopram (LEXAPRO) 20 MG tablet; Take 10 mg tablet one by mouth daily x 7 days then  Take 5 mg tablet (1/2 Tablet) by mouth daily x 7 days then  Take 5 mg tablet one by mouth every other day x 7 days then stop and start Trintellix as directed.  Dispense: 30 tablet; Refill: 0  - vortioxetine HBr (TRINTELLIX) 5 MG TABS tablet; Take 5 mg tablet one by mouth daily x 1 week then  Take 10 mg tablet one by mouth daily.  Dispense: 30 tablet; Refill: 0  - Advised to follow up in 1 month to re-evaluate medication effectiveness.   Family/ staff Communication: Reviewed plan of care with patient verbalized understanding.   Labs/tests ordered: None   Next Appointment:  1 month for follow depression.   I connected with  Nicki Guadalajara on 03/16/20 by a Telephone enabled telemedicine application and verified that I am speaking with the correct person using two identifiers.   I discussed the limitations of evaluation and management by telemedicine. The patient expressed understanding and agreed to proceed.   Spent 12 minutes of non-face to face on the telephone with patient    Sandrea Hughs, NP

## 2020-03-16 NOTE — Patient Instructions (Addendum)
- Decrease to Lexapro 10 mg tablet one by mouth daily x 1 week then      Take 5 mg tablet (1/2Tablet) by mouth daily x 1 week then      Take 5 mg tablet one by mouth every other day x 1 week then     stop and start Trintellix the following day after stopping Lexapro.  - start after stopping lexapro.Take Trintellix 5 mg tablet one by mouth daily x 1 week then take 10 mg tablet daily. - Notify provider or go to ED if depression worsen   Vortioxetine oral tablet What is this medicine? Vortioxetine (vor tee [employer] e teen) is used to treat depression. This medicine may be used for other purposes; ask your health care provider or pharmacist if you have questions. COMMON BRAND NAME(S): BRINTELLIX, Trintellix What should I tell my health care provider before I take this medicine? They need to know if you have any of these conditions:  bipolar disorder or a family history of bipolar disorder  bleeding disorders  drink alcohol  glaucoma  liver disease  low levels of sodium in the blood  seizures  suicidal thoughts, plans, or attempt; a previous suicide attempt by you or a family member  take medicines that treat or prevent blood clots  an unusual or allergic reaction to vortioxetine, other medicines, foods, dyes, or preservatives  pregnant or trying to get pregnant  breast-feeding How should I use this medicine? Take this medicine by mouth with a glass of water. Follow the directions on the prescription label. You can take it with or without food. If it upsets your stomach, take it with food. Take your medicine at regular intervals. Do not take it more often than directed. Do not stop taking this medicine suddenly except upon the advice of your doctor. Stopping this medicine too quickly may cause serious side effects or your condition may worsen. A special MedGuide will be given to you by the pharmacist with each prescription and refill. Be sure to read this information carefully each  time. Talk to your pediatrician regarding the use of this medicine in children. Special care may be needed. Overdosage: If you think you have taken too much of this medicine contact a poison control center or emergency room at once. NOTE: This medicine is only for you. Do not share this medicine with others. What if I miss a dose? If you miss a dose, take it as soon as you can. If it is almost time for your next dose, take only that dose. Do not take double or extra doses. What may interact with this medicine? Do not take this medicine with any of the following medications:  linezolid  MAOIs like Carbex, Eldepryl, Marplan, Nardil, and Parnate  methylene blue (injected into a vein) This medicine may also interact with the following medications:  alcohol  aspirin and aspirin-like medicines  carbamazepine  certain medicines for depression, anxiety, or psychotic disturbances  certain medicines for migraine headache like almotriptan, eletriptan, frovatriptan, naratriptan, rizatriptan, sumatriptan, zolmitriptan  diuretics  fentanyl  furazolidone  isoniazid  medicines that treat or prevent blood clots like warfarin, enoxaparin, and dalteparin  NSAIDs, medicines for pain and inflammation, like ibuprofen or naproxen  phenytoin  procarbazine  quinidine  rasagiline  rifampin  supplements like St. John's wort, kava kava, valerian  tramadol  tryptophan This list may not describe all possible interactions. Give your health care provider a list of all the medicines, herbs, non-prescription drugs, or dietary  supplements you use. Also tell them if you smoke, drink alcohol, or use illegal drugs. Some items may interact with your medicine. What should I watch for while using this medicine? Tell your doctor if your symptoms do not get better or if they get worse. Visit your doctor or health care professional for regular checks on your progress. Because it may take several weeks to  see the full effects of this medicine, it is important to continue your treatment as prescribed by your doctor. Patients and their families should watch out for new or worsening thoughts of suicide or depression. Also watch out for sudden changes in feelings such as feeling anxious, agitated, panicky, irritable, hostile, aggressive, impulsive, severely restless, overly excited and hyperactive, or not being able to sleep. If this happens, especially at the beginning of treatment or after a change in dose, call your health care professional. Dennis Bast may get drowsy or dizzy. Do not drive, use machinery, or do anything that needs mental alertness until you know how this medicine affects you. Do not stand or sit up quickly, especially if you are an older patient. This reduces the risk of dizzy or fainting spells. Alcohol may interfere with the effect of this medicine. Avoid alcoholic drinks. Your mouth may get dry. Chewing sugarless gum or sucking hard candy, and drinking plenty of water may help. Contact your doctor if the problem does not go away or is severe. What side effects may I notice from receiving this medicine? Side effects that you should report to your doctor or health care professional as soon as possible:  allergic reactions like skin rash, itching or hives, swelling of the face, lips, or tongue  anxious  black, tarry stools  changes in vision  confusion  elevated mood, decreased need for sleep, racing thoughts, impulsive behavior  eye pain  fast, irregular heartbeat  feeling faint or lightheaded, falls  feeling agitated, angry, or irritable  hallucination, loss of contact with reality  loss of balance or coordination  loss of memory  painful or prolonged erections  restlessness, pacing, inability to keep still  seizures  stiff muscles  suicidal thoughts or other mood changes  trouble sleeping  unusual bleeding or bruising  unusually weak or  tired  vomiting Side effects that usually do not require medical attention (report to your doctor or health care professional if they continue or are bothersome):  change in appetite or weight  change in sex drive or performance  constipation  dizziness  dry mouth  nausea This list may not describe all possible side effects. Call your doctor for medical advice about side effects. You may report side effects to FDA at 1-800-FDA-1088. Where should I keep my medicine? Keep out of the reach of children. Store at room temperature between 15 and 30 degrees C (59 and 86 degrees F). Throw away any unused medicine after the expiration date. NOTE: This sheet is a summary. It may not cover all possible information. If you have questions about this medicine, talk to your doctor, pharmacist, or health care provider.  2020 Elsevier/Gold Standard (2016-01-10 16:45:13)

## 2020-03-28 ENCOUNTER — Other Ambulatory Visit: Payer: Self-pay | Admitting: Internal Medicine

## 2020-03-28 DIAGNOSIS — B009 Herpesviral infection, unspecified: Secondary | ICD-10-CM

## 2020-04-02 ENCOUNTER — Other Ambulatory Visit: Payer: Self-pay | Admitting: Internal Medicine

## 2020-04-02 DIAGNOSIS — G8929 Other chronic pain: Secondary | ICD-10-CM

## 2020-04-02 DIAGNOSIS — F331 Major depressive disorder, recurrent, moderate: Secondary | ICD-10-CM

## 2020-04-02 MED ORDER — VORTIOXETINE HBR 5 MG PO TABS: ORAL_TABLET | ORAL | 0 refills | Status: DC

## 2020-04-02 NOTE — Telephone Encounter (Signed)
Patient called and requested Rx to be sent to Pharmacy. Has finished the Lexapro.

## 2020-04-09 ENCOUNTER — Telehealth: Payer: Self-pay

## 2020-04-09 NOTE — Telephone Encounter (Signed)
We received two forms from them re: this also that I've given to [place].  We'll need to call patient and review her chart for this information.

## 2020-04-09 NOTE — Telephone Encounter (Signed)
Theresa Carey with [facility] review calling requesting clinical information regarding Rx Trintellix 5mg  Dx given: Moderate episode of recurrent major depressive disorder (HCC) (F33.1).  Theresa Carey would like to know if there are any other diagnoses to submit and what was the tried and failed medication?   Ref# [id number]

## 2020-04-10 ENCOUNTER — Telehealth: Payer: Self-pay

## 2020-04-10 ENCOUNTER — Other Ambulatory Visit: Payer: Self-pay | Admitting: *Deleted

## 2020-04-10 DIAGNOSIS — F411 Generalized anxiety disorder: Secondary | ICD-10-CM

## 2020-04-10 MED ORDER — LORAZEPAM 0.5 MG PO TABS: ORAL_TABLET | ORAL | 1 refills | Status: DC

## 2020-04-10 NOTE — Telephone Encounter (Signed)
Received fax from [facility] [phone] stated that Trintellix is APPROVED until 08/24/2020.   Member ID: [id number]

## 2020-04-10 NOTE — Telephone Encounter (Signed)
°  Paper work faxed back to [phone] and [phone]    Theresa Carey with [facility] review calling requesting clinical information regarding Rx Trintellix 5mg  Dx given: Moderate episode of recurrent major depressive disorder (HCC) (F33.1).  Theresa Carey would like to know if there are any other diagnoses to submit and what was the tried and failed medication?   Ref# [id number] [phone]   2. Moderate episode of recurrent major depressive disorder (HCC) Worsening symptoms thinks lexapro no longer working for her request Trintellix.Advised to wean off lexapro as below and then start on Trintellix.  - Advised to notify provider or go to ED if depression symptoms worsen. - side effects discussed and additional educational information for Trintellix provided on AVS to be mailed by CMA.  - vortioxetine HBr (TRINTELLIX) 5 MG TABS tablet; Take 5 mg tablet one by mouth daily x 1 week then  Take 10 mg tablet one by mouth daily.  Dispense: 30 tablet; Refill: 0

## 2020-04-10 NOTE — Telephone Encounter (Signed)
[facility] Pharmacist called requesting a new Rx to be faxed.  Last RF: 03/09/2020 Pended Rx and sent to Dr. Mariea Clonts for approval.

## 2020-04-12 ENCOUNTER — Other Ambulatory Visit: Payer: Self-pay | Admitting: *Deleted

## 2020-04-12 DIAGNOSIS — F331 Major depressive disorder, recurrent, moderate: Secondary | ICD-10-CM

## 2020-04-12 MED ORDER — VORTIOXETINE HBR 5 MG PO TABS: ORAL_TABLET | ORAL | 0 refills | Status: DC

## 2020-04-12 MED ORDER — VORTIOXETINE HBR 10 MG PO TABS: 10.0000 mg | ORAL_TABLET | Freq: Every day | ORAL | 1 refills | Status: DC

## 2020-04-12 NOTE — Telephone Encounter (Signed)
Incoming call received from [facility] requesting a refill on Trintellix 10 mg tablet.  According to the instructions patient was to increase dose to 10 mg after 1 week.   Medication list updated and new rx submitted

## 2020-04-12 NOTE — Addendum Note (Signed)
Addended by: Logan Bores on: 04/12/2020 01:44 PM   Modules accepted: Orders

## 2020-04-12 NOTE — Telephone Encounter (Signed)
Patient called requesting Rx Trintellix,  to be sent to [facility] because it is cheaper to get it through them.  Faxed.

## 2020-04-16 ENCOUNTER — Other Ambulatory Visit: Payer: Self-pay | Admitting: *Deleted

## 2020-04-16 DIAGNOSIS — F5101 Primary insomnia: Secondary | ICD-10-CM

## 2020-04-16 DIAGNOSIS — R1011 Right upper quadrant pain: Secondary | ICD-10-CM

## 2020-04-16 DIAGNOSIS — G8929 Other chronic pain: Secondary | ICD-10-CM

## 2020-04-16 MED ORDER — OXYCODONE HCL 10 MG PO TABS: ORAL_TABLET | ORAL | 0 refills | Status: DC

## 2020-04-16 MED ORDER — ZOLPIDEM TARTRATE 10 MG PO TABS: ORAL_TABLET | ORAL | 0 refills | Status: DC

## 2020-04-16 NOTE — Telephone Encounter (Signed)
Pharmacy sent refill Request Epic LR: 03/13/2020 No updated contract on file.  Pended Rx and sent to Dr. Mariea Clonts for approval.

## 2020-04-17 ENCOUNTER — Telehealth: Payer: Self-pay

## 2020-04-17 ENCOUNTER — Encounter: Payer: Self-pay | Admitting: Nurse Practitioner

## 2020-04-17 ENCOUNTER — Other Ambulatory Visit: Payer: Self-pay

## 2020-04-17 ENCOUNTER — Ambulatory Visit (INDEPENDENT_AMBULATORY_CARE_PROVIDER_SITE_OTHER): Payer: Medicare Other | Admitting: Nurse Practitioner

## 2020-04-17 DIAGNOSIS — F331 Major depressive disorder, recurrent, moderate: Secondary | ICD-10-CM | POA: Diagnosis not present

## 2020-04-17 NOTE — Progress Notes (Signed)
This service is provided via telemedicine  No vital signs collected/recorded due to the encounter was a telemedicine visit.   Location of patient (ex: home, work):  Home  Patient consents to a telephone visit:  Yes, see encounter dated 04/17/2020  Location of the provider (ex: office, home):  [place]  Name of any referring provider:  Hollace Kinnier, DO  Names of all persons participating in the telemedicine service and their role in the encounter:  .Sherrie Mustache, Nurse Practitioner, Carroll Kinds, CMA, and patient.   Time spent on call:  10 minutes with medical assistant      Careteam: Patient Care Team: Gayland Curry, DO as PCP - General (Geriatric Medicine)  PLACE OF SERVICE:  [place] Directive information Does Patient Have a Medical Advance Directive?: No  Allergies  Allergen Reactions  . Avelox [Moxifloxacin Hcl In Nacl]     Low blood pressure  . Compazine [Prochlorperazine Edisylate] Other (See Comments)    Caused lockjaw  . Ibuprofen Hives  . Morphine And Related Nausea And Vomiting  . Promethazine Hcl Nausea And Vomiting    Chief Complaint  Patient presents with  . Follow-up    66 month follow up of depression  . Best Practice Recommendations    Pneumonia, Flu vaccine  . Quality Metric Gaps    Mammogram     HPI: Patient is a 64 y.o. female for follow up on depression She was not able to afford trintellix called the company and filled out paperwork for financial help to be able to afford medication. She has weaned off lexapro and started on trintelix 5 mg, increased to 10 mg this past weekend, has only been on medication for 2 weeks. reports feeling a little better but feels like this is mostly due to her mother being in facility. When she is coming home her sister plans to come help her.  She also is getting another person to come help.  Her father was a Psychologist, clinical and looking at the [place] for more help.  No thoughts of SI or  HI.   Review of Systems:  Review of Systems  Psychiatric/Behavioral: Positive for depression. The patient is nervous/anxious.     Past Medical History:  Diagnosis Date  . Abdominal pain 2000   chronic abdominal pain with nausea and vomiting  . Abnormal liver function   . Allergy   . Hypertension    Past Surgical History:  Procedure Laterality Date  . ABDOMINAL HYSTERECTOMY    . CARDIAC CATHETERIZATION  10/11/2019  . CHOLECYSTECTOMY     Social History:   reports that she has been smoking cigarettes. She has been smoking about 1.00 pack per day. She has never used smokeless tobacco. She reports previous alcohol use. She reports that she does not use drugs.  Family History  Problem Relation Age of Onset  . Hypertension Father   . Cancer - Other Father        Bladder  . Dementia Father   . Hypertension Mother   . Parkinson's disease Mother   . Diabetes Maternal Grandmother   . Cancer - Other Maternal Grandmother        Pancreatic  . Cancer - Other Paternal Grandmother        Bone    Medications: Patient's Medications  New Prescriptions   No medications on file  Previous Medications   ASPIRIN 81 MG CHEWABLE TABLET    Chew 81 mg by mouth daily.  BIOTIN 1 MG CAPS    Take 1 mg by mouth daily at 6 (six) AM.   CHOLECALCIFEROL (VITAMIN D) 2000 UNITS CAPS    Take 1 capsule (2,000 Units total) by mouth daily.   ESCITALOPRAM (LEXAPRO) 20 MG TABLET    Take 10 mg tablet one by mouth daily x 7 days then  Take 5 mg tablet (1/2 Tablet) by mouth daily x 7 days then  Take 5 mg tablet one by mouth every other day x 7 days then stop and start Trintellix as directed.   FAMOTIDINE (PEPCID) 40 MG TABLET    take 1 tablet by mouth once daily   GABAPENTIN (NEURONTIN) 100 MG CAPSULE    Take 1 capsule (100 mg total) by mouth 3 (three) times daily.   LISINOPRIL (ZESTRIL) 20 MG TABLET    Take 1 tablet (20 mg total) by mouth daily.   LORAZEPAM (ATIVAN) 0.5 MG TABLET    Take one tablet by mouth  every 6 hours as needed   METOPROLOL SUCCINATE (TOPROL-XL) 25 MG 24 HR TABLET    Take 25 mg by mouth 2 (two) times daily. Morning and Night.   OXYCODONE HCL 10 MG TABS    Take one tablet by mouth every 6 hours as needed for pain   SIMVASTATIN (ZOCOR) 20 MG TABLET    take 1 tablet by mouth at bedtime   VALACYCLOVIR (VALTREX) 500 MG TABLET    take 1 tablet by mouth twice a day   VORTIOXETINE HBR (TRINTELLIX) 10 MG TABS TABLET    Take 1 tablet (10 mg total) by mouth daily.   ZOLPIDEM (AMBIEN) 10 MG TABLET    Take one tablet by mouth at bedtime for sleep  Modified Medications   No medications on file  Discontinued Medications   No medications on file    Physical Exam:  There were no vitals filed for this visit. There is no height or weight on file to calculate BMI. Wt Readings from Last 3 Encounters:  11/08/18 120 lb (54.4 kg)  07/05/18 125 lb (56.7 kg)  02/22/18 123 lb (55.8 kg)      Labs reviewed: Basic Metabolic Panel: Recent Labs    09/28/19 0000  CO2 22  BUN 19  CREATININE 1.1   Liver Function Tests: Recent Labs    09/28/19 0000  AST 25  ALT 26   No results for input(s): LIPASE, AMYLASE in the last 8760 hours. No results for input(s): AMMONIA in the last 8760 hours. CBC: Recent Labs    09/28/19 0000  WBC 13.1  HGB 14.9  HCT 46  PLT 346   Lipid Panel: No results for input(s): CHOL, HDL, LDLCALC, TRIG, CHOLHDL, LDLDIRECT in the last 8760 hours. TSH: No results for input(s): TSH in the last 8760 hours. A1C: Lab Results  Component Value Date   HGBA1C 5.4 11/08/2018     Assessment/Plan 1. Moderate episode of recurrent major depressive disorder ([place]) -overall doing better on trintellix, cost is an issue but working with company to get help with cost. Encouraged deep breathing, exercise, journaling and counseling to help depression along with medication.   Carlos American. Harle Battiest  [facility] [phone]    Virtual Visit  via Telephone Note  I connected with@ on 04/17/20 at  9:30 AM EDT by telephone and verified that I am speaking with the correct person using two identifiers.  Location: Patient: home Provider: [facility]   I discussed the limitations, risks,  security and privacy concerns of performing an evaluation and management service by telephone and the availability of in person appointments. I also discussed with the patient that there may be a patient responsible charge related to this service. The patient expressed understanding and agreed to proceed.   I discussed the assessment and treatment plan with the patient. The patient was provided an opportunity to ask questions and all were answered. The patient agreed with the plan and demonstrated an understanding of the instructions.   The patient was advised to call back or seek an in-person evaluation if the symptoms worsen or if the condition fails to improve as anticipated.  I provided 13 minutes of non-face-to-face time during this encounter.  Carlos American. Harle Battiest Avs printed and mailed

## 2020-04-17 NOTE — Telephone Encounter (Signed)
Ms. geralyn, figiel are scheduled for a virtual visit with your provider today.    Just as we do with appointments in the office, we must obtain your consent to participate.  Your consent will be active for this visit and any virtual visit you may have with one of our providers in the next 365 days.    If you have a MyChart account, I can also send a copy of this consent to you electronically.  All virtual visits are billed to your insurance company just like a traditional visit in the office.  As this is a virtual visit, video technology does not allow for your provider to perform a traditional examination.  This may limit your provider's ability to fully assess your condition.  If your provider identifies any concerns that need to be evaluated in person or the need to arrange testing such as labs, EKG, etc, we will make arrangements to do so.    Although advances in technology are sophisticated, we cannot ensure that it will always work on either your end or our end.  If the connection with a video visit is poor, we may have to switch to a telephone visit.  With either a video or telephone visit, we are not always able to ensure that we have a secure connection.   I need to obtain your verbal consent now.   Are you willing to proceed with your visit today?   Kimberl Vig has provided verbal consent on 04/17/2020 for a virtual visit (video or telephone).   Carroll Kinds, CMA 04/17/2020  9:14 AM

## 2020-04-19 ENCOUNTER — Encounter: Payer: Self-pay | Admitting: Nurse Practitioner

## 2020-04-19 ENCOUNTER — Other Ambulatory Visit: Payer: Self-pay

## 2020-04-19 ENCOUNTER — Ambulatory Visit (INDEPENDENT_AMBULATORY_CARE_PROVIDER_SITE_OTHER): Payer: Medicare Other | Admitting: Nurse Practitioner

## 2020-04-19 DIAGNOSIS — Z0000 Encounter for general adult medical examination without abnormal findings: Secondary | ICD-10-CM | POA: Diagnosis not present

## 2020-04-19 NOTE — Progress Notes (Signed)
Subjective:   Theresa Carey is a 64 y.o. female who presents for Medicare Annual (Subsequent) preventive examination.  Review of Systems     Cardiac Risk Factors include: smoking/ tobacco exposure;hypertension;advanced age (>74men, >60 women)     Objective:    Today's Vitals   04/19/20 1123  PainSc: 7    There is no height or weight on file to calculate BMI.  Advanced Directives 04/19/2020 04/17/2020 03/16/2020 10/20/2019 07/29/2019 02/22/2018 11/07/2016  Does Patient Have a Medical Advance Directive? No No No No No No No  Would patient like information on creating a medical advance directive? - - No - Patient declined No - Patient declined - No - Patient declined -    Current Medications (verified) Outpatient Encounter Medications as of 04/19/2020  Medication Sig  . aspirin 81 MG chewable tablet Chew 81 mg by mouth daily.   . Biotin 1 MG CAPS Take 1 mg by mouth daily at 6 (six) AM.  . Cholecalciferol (VITAMIN D) 2000 UNITS CAPS Take 1 capsule (2,000 Units total) by mouth daily.  Marland Kitchen escitalopram (LEXAPRO) 20 MG tablet Take 10 mg tablet one by mouth daily x 7 days then  Take 5 mg tablet (1/2 Tablet) by mouth daily x 7 days then  Take 5 mg tablet one by mouth every other day x 7 days then stop and start Trintellix as directed.  . famotidine (PEPCID) 40 MG tablet take 1 tablet by mouth once daily  . gabapentin (NEURONTIN) 100 MG capsule Take 1 capsule (100 mg total) by mouth 3 (three) times daily.  Marland Kitchen lisinopril (ZESTRIL) 20 MG tablet Take 1 tablet (20 mg total) by mouth daily.  Marland Kitchen LORazepam (ATIVAN) 0.5 MG tablet Take one tablet by mouth every 6 hours as needed  . metoprolol succinate (TOPROL-XL) 25 MG 24 hr tablet Take 25 mg by mouth 2 (two) times daily. Morning and Night.  . Oxycodone HCl 10 MG TABS Take one tablet by mouth every 6 hours as needed for pain  . simvastatin (ZOCOR) 20 MG tablet take 1 tablet by mouth at bedtime  . valACYclovir (VALTREX) 500 MG tablet take 1 tablet by mouth  twice a day  . vortioxetine HBr (TRINTELLIX) 10 MG TABS tablet Take 1 tablet (10 mg total) by mouth daily.  Marland Kitchen zolpidem (AMBIEN) 10 MG tablet Take one tablet by mouth at bedtime for sleep   No facility-administered encounter medications on file as of 04/19/2020.    Allergies (verified) Avelox [moxifloxacin hcl in nacl], Compazine [prochlorperazine edisylate], Ibuprofen, Morphine and related, and Promethazine hcl   History: Past Medical History:  Diagnosis Date  . Abdominal pain 2000   chronic abdominal pain with nausea and vomiting  . Abnormal liver function   . Allergy   . Hypertension    Past Surgical History:  Procedure Laterality Date  . ABDOMINAL HYSTERECTOMY    . CARDIAC CATHETERIZATION  10/11/2019  . CHOLECYSTECTOMY     Family History  Problem Relation Age of Onset  . Hypertension Father   . Cancer - Other Father        Bladder  . Dementia Father   . Hypertension Mother   . Parkinson's disease Mother   . Diabetes Maternal Grandmother   . Cancer - Other Maternal Grandmother        Pancreatic  . Cancer - Other Paternal Grandmother        Bone   Social History   Socioeconomic History  . Marital status: Divorced    Spouse  name: Not on file  . Number of children: Not on file  . Years of education: Not on file  . Highest education level: Not on file  Occupational History  . Not on file  Tobacco Use  . Smoking status: Current Every Day Smoker    Packs/day: 1.00    Types: Cigarettes  . Smokeless tobacco: Never Used  Vaping Use  . Vaping Use: Never used  Substance and Sexual Activity  . Alcohol use: Not Currently    Comment: occasional  . Drug use: No  . Sexual activity: Not on file  Other Topics Concern  . Not on file  Social History Narrative  . Not on file   Social Determinants of Health   Financial Resource Strain:   . Difficulty of Paying Living Expenses: Not on file  Food Insecurity:   . Worried About Charity fundraiser in the Last Year: Not  on file  . Ran Out of Food in the Last Year: Not on file  Transportation Needs:   . Lack of Transportation (Medical): Not on file  . Lack of Transportation (Non-Medical): Not on file  Physical Activity:   . Days of Exercise per Week: Not on file  . Minutes of Exercise per Session: Not on file  Stress:   . Feeling of Stress : Not on file  Social Connections:   . Frequency of Communication with Friends and Family: Not on file  . Frequency of Social Gatherings with Friends and Family: Not on file  . Attends Religious Services: Not on file  . Active Member of Clubs or Organizations: Not on file  . Attends Archivist Meetings: Not on file  . Marital Status: Not on file    Tobacco Counseling Ready to quit: Not Answered Counseling given: Not Answered   Clinical Intake:  Pre-visit preparation completed: Yes  Pain : 0-10 Pain Score: 7  Pain Type: Chronic pain Pain Location: Abdomen Pain Orientation: Right, Upper Pain Descriptors / Indicators: Sharp Pain Onset: More than a month ago Pain Frequency: Intermittent Pain Relieving Factors: oxycodone  Pain Relieving Factors: oxycodone  BMI - recorded: 19 Nutritional Status: BMI <19  Underweight Nutritional Risks: Nausea/ vomitting/ diarrhea  How often do you need to have someone help you when you read instructions, pamphlets, or other written materials from your doctor or pharmacy?: 1 - Never  Diabetic?no         Activities of Daily Living In your present state of health, do you have any difficulty performing the following activities: 04/19/2020  Hearing? N  Vision? N  Difficulty concentrating or making decisions? N  Walking or climbing stairs? N  Dressing or bathing? N  Doing errands, shopping? N  Preparing Food and eating ? N  Using the Toilet? N  In the past six months, have you accidently leaked urine? N  Do you have problems with loss of bowel control? N  Managing your Medications? N  Managing your  Finances? N  Housekeeping or managing your Housekeeping? N  Some recent data might be hidden    Patient Care Team: Gayland Curry, DO as PCP - General (Geriatric Medicine)  Indicate any recent Medical Services you may have received from other than [facility] providers in the past year (date may be approximate).     Assessment:   This is a routine wellness examination for [place].  Hearing/Vision screen  Hearing Screening   125Hz  250Hz  500Hz  1000Hz  2000Hz  3000Hz  4000Hz  6000Hz  8000Hz   Right ear:  Left ear:           Comments: Patient has no hearing problems  Vision Screening Comments: Patient wears reading glasses  Dietary issues and exercise activities discussed: Current Exercise Habits: Home exercise routine, Type of exercise: walking, Time (Minutes): 10, Frequency (Times/Week): 5, Weekly Exercise (Minutes/Week): 50  Goals    . Increase physical activity     Would like to start walking more.       Depression Screen PHQ 2/9 Scores 04/19/2020 04/17/2020 04/17/2020 03/07/2019 11/08/2018 07/05/2018 02/22/2018  PHQ - 2 Score 2 2 3  0 0 0 0  PHQ- 9 Score 5 5 6  - - - -  Exception Documentation Other- indicate reason in comment box - - - - - -  Not completed Patient taking medication for depression - - - - - -    Fall Risk Fall Risk  04/19/2020 07/29/2019 03/07/2019 11/08/2018 07/05/2018  Falls in the past year? 1 0 1 0 1  Comment Syncopal episodes - - - -  Number falls in past yr: 1 - 0 0 0  Injury with Fall? 0 0 0 0 0    Any stairs in or around the home? No  If so, are there any without handrails? No  Home free of loose throw rugs in walkways, pet beds, electrical cords, etc? Yes  Adequate lighting in your home to reduce risk of falls? Yes   ASSISTIVE DEVICES UTILIZED TO PREVENT FALLS:  Life alert? No  Use of a cane, walker or w/c? No  Grab bars in the bathroom? Yes  Shower chair or bench in shower? Yes  Elevated toilet seat or a handicapped toilet? Yes   TIMED UP AND GO:  na Cognitive Function: MMSE - Mini Mental State Exam 09/28/2015  Not completed: (No Data)  Orientation to time 5  Orientation to Place 5  Registration 3  Attention/ Calculation 4  Recall 3  Language- name 2 objects 2  Language- repeat 1  Language- follow 3 step command 3  Language- read & follow direction 1  Write a sentence 1  Copy design 1  Total score 29     6CIT Screen 04/19/2020  What Year? 0 points  What month? 0 points  What time? 0 points  Count back from 20 0 points  Months in reverse 4 points  Repeat phrase 0 points  Total Score 4    Immunizations Immunization History  Administered Date(s) Administered  . Influenza Inj Mdck Quad Pf 06/16/2017  . Influenza Split 08/15/2012  . Influenza Whole 07/02/2013  . Influenza,inj,Quad PF,6+ Mos 09/28/2015  . Influenza-Unspecified 05/22/2014, 06/21/2016, 06/04/2018, 06/10/2019  . Moderna SARS-COVID-2 Vaccination 10/27/2019, 11/25/2019  . Pneumococcal Conjugate-13 08/31/2014, 08/31/2014  . Pneumococcal Polysaccharide-23 08/15/2012  . Tdap 09/28/2015    TDAP status: Up to date Flu Vaccine status: Up to date Pneumonia vaccine due Covid-19 vaccine status: Completed vaccines  Qualifies for Shingles Vaccine? Yes   Zostavax completed No   Shingrix Completed?: No.    Education has been provided regarding the importance of this vaccine. Patient has been advised to call insurance company to determine out of pocket expense if they have not yet received this vaccine. Advised may also receive vaccine at local pharmacy or Health Dept. Verbalized acceptance and understanding.  Screening Tests Health Maintenance  Topic Date Due  . PNA vac Low Risk Adult (2 of 2 - PPSV23) 09/19/2018  . MAMMOGRAM  04/12/2020  . INFLUENZA VACCINE  03/25/2020  . COLONOSCOPY  06/30/2023  .  TETANUS/TDAP  09/27/2025  . DEXA SCAN  Completed  . COVID-19 Vaccine  Completed  . Hepatitis C Screening  Completed    Health Maintenance  Health Maintenance  Due  Topic Date Due  . PNA vac Low Risk Adult (2 of 2 - PPSV23) 09/19/2018  . MAMMOGRAM  04/12/2020  . INFLUENZA VACCINE  03/25/2020    Colorectal cancer screening: Completed 2019. Repeat every 5 years Mammogram status: Completed 04/12/20. Repeat every year Bone Density status: Completed 10/25/2018. Results reflect: Bone density results: OSTEOPENIA. Repeat every 2 years.  Lung Cancer Screening: (Low Dose CT Chest recommended if Age 50-80 years, 30 pack-year currently smoking OR have quit w/in 15years.) does qualify.   Lung Cancer Screening Referral: no, patient declined  Additional Screening:  Hepatitis C Screening: does qualify; Completed completed   Vision Screening: Recommended annual ophthalmology exams for early detection of glaucoma and other disorders of the eye. Is the patient up to date with their annual eye exam?  No  Who is the provider or what is the name of the office in which the patient attends annual eye exams? none If pt is not established with a provider, would they like to be referred to a provider to establish care? No .   Dental Screening: Recommended annual dental exams for proper oral hygiene  Community Resource Referral / Chronic Care Management: CRR required this visit?  No   CCM required this visit?  No      Plan:     I have personally reviewed and noted the following in the patient's chart:   . Medical and social history . Use of alcohol, tobacco or illicit drugs  . Current medications and supplements . Functional ability and status . Nutritional status . Physical activity . Advanced directives . List of other physicians . Hospitalizations, surgeries, and ER visits in previous 12 months . Vitals . Screenings to include cognitive, depression, and falls . Referrals and appointments  In addition, I have reviewed and discussed with patient certain preventive protocols, quality metrics, and best practice recommendations. A written personalized  care plan for preventive services as well as general preventive health recommendations were provided to patient.     Lauree Chandler, NP   04/19/2020

## 2020-04-19 NOTE — Patient Instructions (Signed)
Theresa Carey , Thank you for taking time to come for your Medicare Wellness Visit. I appreciate your ongoing commitment to your health goals. Please review the following plan we discussed and let me know if I can assist you in the future.   Screening recommendations/referrals: Colonoscopy up to date Mammogram DUE- to complete Bone Density up to date Recommended yearly ophthalmology/optometry visit for glaucoma screening and checkup Recommended yearly dental visit for hygiene and checkup  Vaccinations: Influenza vaccine TO GET at local pharmacy  Pneumococcal vaccine- need prevnar 13 Tdap vaccine up to date Shingles vaccine recommended to get at local pharmacy    Advanced directives: recommended to complete.   Conditions/risks identified: complications with smoking  Next appointment: 1 year.    Preventive Care 66 Years and Older, Female Preventive care refers to lifestyle choices and visits with your health care provider that can promote health and wellness. What does preventive care include?  A yearly physical exam. This is also called an annual well check.  Dental exams once or twice a year.  Routine eye exams. Ask your health care provider how often you should have your eyes checked.  Personal lifestyle choices, including:  Daily care of your teeth and gums.  Regular physical activity.  Eating a healthy diet.  Avoiding tobacco and drug use.  Limiting alcohol use.  Practicing safe sex.  Taking low-dose aspirin every day.  Taking vitamin and mineral supplements as recommended by your health care provider. What happens during an annual well check? The services and screenings done by your health care provider during your annual well check will depend on your age, overall health, lifestyle risk factors, and family history of disease. Counseling  Your health care provider may ask you questions about your:  Alcohol use.  Tobacco use.  Drug use.  Emotional  well-being.  Home and relationship well-being.  Sexual activity.  Eating habits.  History of falls.  Memory and ability to understand (cognition).  Work and work Statistician.  Reproductive health. Screening  You may have the following tests or measurements:  Height, weight, and BMI.  Blood pressure.  Lipid and cholesterol levels. These may be checked every 5 years, or more frequently if you are over 34 years old.  Skin check.  Lung cancer screening. You may have this screening every year starting at age 25 if you have a 30-pack-year history of smoking and currently smoke or have quit within the past 15 years.  Fecal occult blood test (FOBT) of the stool. You may have this test every year starting at age 73.  Flexible sigmoidoscopy or colonoscopy. You may have a sigmoidoscopy every 5 years or a colonoscopy every 10 years starting at age 36.  Hepatitis C blood test.  Hepatitis B blood test.  Sexually transmitted disease (STD) testing.  Diabetes screening. This is done by checking your blood sugar (glucose) after you have not eaten for a while (fasting). You may have this done every 1-3 years.  Bone density scan. This is done to screen for osteoporosis. You may have this done starting at age 17.  Mammogram. This may be done every 1-2 years. Talk to your health care provider about how often you should have regular mammograms. Talk with your health care provider about your test results, treatment options, and if necessary, the need for more tests. Vaccines  Your health care provider may recommend certain vaccines, such as:  Influenza vaccine. This is recommended every year.  Tetanus, diphtheria, and acellular pertussis (Tdap, Td)  vaccine. You may need a Td booster every 10 years.  Zoster vaccine. You may need this after age 53.  Pneumococcal 13-valent conjugate (PCV13) vaccine. One dose is recommended after age 75.  Pneumococcal polysaccharide (PPSV23) vaccine. One  dose is recommended after age 35. Talk to your health care provider about which screenings and vaccines you need and how often you need them. This information is not intended to replace advice given to you by your health care provider. Make sure you discuss any questions you have with your health care provider. Document Released: 09/07/2015 Document Revised: 04/30/2016 Document Reviewed: 06/12/2015 Elsevier Interactive Patient Education  2017 [employer] Prevention in the Home Falls can cause injuries. They can happen to people of all ages. There are many things you can do to make your home safe and to help prevent falls. What can I do on the outside of my home?  Regularly fix the edges of walkways and driveways and fix any cracks.  Remove anything that might make you trip as you walk through a door, such as a raised step or threshold.  Trim any bushes or trees on the path to your home.  Use bright outdoor lighting.  Clear any walking paths of anything that might make someone trip, such as rocks or tools.  Regularly check to see if handrails are loose or broken. Make sure that both sides of any steps have handrails.  Any raised decks and porches should have guardrails on the edges.  Have any leaves, snow, or ice cleared regularly.  Use sand or salt on walking paths during winter.  Clean up any spills in your garage right away. This includes oil or grease spills. What can I do in the bathroom?  Use night lights.  Install grab bars by the toilet and in the tub and shower. Do not use towel bars as grab bars.  Use non-skid mats or decals in the tub or shower.  If you need to sit down in the shower, use a plastic, non-slip stool.  Keep the floor dry. Clean up any water that spills on the floor as soon as it happens.  Remove soap buildup in the tub or shower regularly.  Attach bath mats securely with double-sided non-slip rug tape.  Do not have throw rugs and other  things on the floor that can make you trip. What can I do in the bedroom?  Use night lights.  Make sure that you have a light by your bed that is easy to reach.  Do not use any sheets or blankets that are too big for your bed. They should not hang down onto the floor.  Have a firm chair that has side arms. You can use this for support while you get dressed.  Do not have throw rugs and other things on the floor that can make you trip. What can I do in the kitchen?  Clean up any spills right away.  Avoid walking on wet floors.  Keep items that you use a lot in easy-to-reach places.  If you need to reach something above you, use a strong step stool that has a grab bar.  Keep electrical cords out of the way.  Do not use floor polish or wax that makes floors slippery. If you must use wax, use non-skid floor wax.  Do not have throw rugs and other things on the floor that can make you trip. What can I do with my stairs?  Do not leave  any items on the stairs.  Make sure that there are handrails on both sides of the stairs and use them. Fix handrails that are broken or loose. Make sure that handrails are as long as the stairways.  Check any carpeting to make sure that it is firmly attached to the stairs. Fix any carpet that is loose or worn.  Avoid having throw rugs at the top or bottom of the stairs. If you do have throw rugs, attach them to the floor with carpet tape.  Make sure that you have a light switch at the top of the stairs and the bottom of the stairs. If you do not have them, ask someone to add them for you. What else can I do to help prevent falls?  Wear shoes that:  Do not have high heels.  Have rubber bottoms.  Are comfortable and fit you well.  Are closed at the toe. Do not wear sandals.  If you use a stepladder:  Make sure that it is fully opened. Do not climb a closed stepladder.  Make sure that both sides of the stepladder are locked into place.  Ask  someone to hold it for you, if possible.  Clearly mark and make sure that you can see:  Any grab bars or handrails.  First and last steps.  Where the edge of each step is.  Use tools that help you move around (mobility aids) if they are needed. These include:  Canes.  Walkers.  Scooters.  Crutches.  Turn on the lights when you go into a dark area. Replace any light bulbs as soon as they burn out.  Set up your furniture so you have a clear path. Avoid moving your furniture around.  If any of your floors are uneven, fix them.  If there are any pets around you, be aware of where they are.  Review your medicines with your doctor. Some medicines can make you feel dizzy. This can increase your chance of falling. Ask your doctor what other things that you can do to help prevent falls. This information is not intended to replace advice given to you by your health care provider. Make sure you discuss any questions you have with your health care provider. Document Released: 06/07/2009 Document Revised: 01/17/2016 Document Reviewed: 09/15/2014 Elsevier Interactive Patient Education  2017 [employer].

## 2020-04-19 NOTE — Progress Notes (Signed)
This service is provided via telemedicine  No vital signs collected/recorded due to the encounter was a telemedicine visit.   Location of patient (ex: home, work):  Home  Patient consents to a telephone visit:  Yes, see encounter dated 04/17/2020  Location of the provider (ex: office, home):  [place]  Name of any referring provider:  Hollace Kinnier, DO  Names of all persons participating in the telemedicine service and their role in the encounter:  Sherrie Mustache, Nurse Practitioner, Carroll Kinds, CMA, and patient.   Time spent on call:  5 minutes with medical assistant

## 2020-04-20 ENCOUNTER — Other Ambulatory Visit: Payer: Self-pay | Admitting: *Deleted

## 2020-04-20 MED ORDER — LISINOPRIL 20 MG PO TABS: 20.0000 mg | ORAL_TABLET | Freq: Every day | ORAL | 1 refills | Status: DC

## 2020-04-20 NOTE — Telephone Encounter (Signed)
Received refill Request from pharmacy.  

## 2020-05-01 DIAGNOSIS — Z23 Encounter for immunization: Secondary | ICD-10-CM | POA: Diagnosis not present

## 2020-05-04 ENCOUNTER — Telehealth: Payer: Self-pay | Admitting: *Deleted

## 2020-05-04 NOTE — Telephone Encounter (Signed)
Patient called and stated that [facility] in [place] was going to fax a form over for her Trintellix to help her get it cheaper.   Received from and filled out and placed in Dr. Cyndi Lennert folder for Dr. Mariea Clonts to review and sign.

## 2020-05-04 NOTE — Telephone Encounter (Signed)
Patient notified received. To fax back to [facility] Fax: [phone]

## 2020-05-11 NOTE — Telephone Encounter (Signed)
Patient is out of her Medication Trintellix. Wants to know if you could get the form faxed back to [facility] today so she can pick up her medication at a cheaper price.   [place] Fax: [phone]

## 2020-05-11 NOTE — Telephone Encounter (Signed)
Ideally, this should have been addressed in my absence--I'm sure someone else that works here could have signed it.  I will do my best to take care of it later today.  I am at [facility] and that paperwork is at home.

## 2020-05-11 NOTE — Telephone Encounter (Signed)
Im sorry Dr. Mariea Clonts, that is my fault.

## 2020-05-14 ENCOUNTER — Other Ambulatory Visit: Payer: Self-pay

## 2020-05-14 DIAGNOSIS — G8929 Other chronic pain: Secondary | ICD-10-CM

## 2020-05-14 DIAGNOSIS — F5101 Primary insomnia: Secondary | ICD-10-CM

## 2020-05-14 MED ORDER — ZOLPIDEM TARTRATE 10 MG PO TABS: ORAL_TABLET | ORAL | 0 refills | Status: DC

## 2020-05-14 MED ORDER — OXYCODONE HCL 10 MG PO TABS: ORAL_TABLET | ORAL | 0 refills | Status: DC

## 2020-05-14 NOTE — Telephone Encounter (Signed)
Refill request received from [facility] for Zolpidem 10mg  tablet one at bedtime and Oxycodone 10 mg tablet one every 6 hours as needed for pain. No updated contract on file. Medications pended and sent to Dr. Mariea Clonts for approval.

## 2020-05-14 NOTE — Telephone Encounter (Signed)
Completed now and on your desk.

## 2020-05-14 NOTE — Telephone Encounter (Signed)
Faxed  Paperwork

## 2020-05-31 ENCOUNTER — Other Ambulatory Visit: Payer: Self-pay | Admitting: Internal Medicine

## 2020-05-31 DIAGNOSIS — B009 Herpesviral infection, unspecified: Secondary | ICD-10-CM

## 2020-05-31 NOTE — Telephone Encounter (Signed)
rx sent to pharmacy by e-script  

## 2020-06-08 ENCOUNTER — Other Ambulatory Visit: Payer: Self-pay | Admitting: Internal Medicine

## 2020-06-08 DIAGNOSIS — M792 Neuralgia and neuritis, unspecified: Secondary | ICD-10-CM

## 2020-06-08 DIAGNOSIS — F411 Generalized anxiety disorder: Secondary | ICD-10-CM

## 2020-06-08 MED ORDER — LORAZEPAM 0.5 MG PO TABS: ORAL_TABLET | ORAL | 1 refills | Status: DC

## 2020-06-08 NOTE — Telephone Encounter (Signed)
Pharmacy requested refill by escribe.  [place] down, phoned to pharmacy.

## 2020-06-11 ENCOUNTER — Other Ambulatory Visit: Payer: Self-pay

## 2020-06-11 DIAGNOSIS — R1011 Right upper quadrant pain: Secondary | ICD-10-CM

## 2020-06-11 DIAGNOSIS — G8929 Other chronic pain: Secondary | ICD-10-CM

## 2020-06-11 MED ORDER — OXYCODONE HCL 10 MG PO TABS: ORAL_TABLET | ORAL | 0 refills | Status: DC

## 2020-06-11 NOTE — Telephone Encounter (Signed)
Fax received from [facility] for Oxycodone 10 mg tablet one every 6 hours as needed for pain. Medication pended and sent to Dr. Mariea Clonts for approval.

## 2020-06-14 ENCOUNTER — Other Ambulatory Visit: Payer: Self-pay | Admitting: *Deleted

## 2020-06-14 DIAGNOSIS — F5101 Primary insomnia: Secondary | ICD-10-CM

## 2020-06-14 MED ORDER — ZOLPIDEM TARTRATE 10 MG PO TABS: ORAL_TABLET | ORAL | 0 refills | Status: DC

## 2020-06-14 NOTE — Telephone Encounter (Signed)
Patient called requesting refill No contract on file Epic LR: 05/14/2020 Pended Rx and sent to Dr. Mariea Clonts for approval.

## 2020-06-27 DIAGNOSIS — R109 Unspecified abdominal pain: Secondary | ICD-10-CM | POA: Diagnosis not present

## 2020-06-27 DIAGNOSIS — E876 Hypokalemia: Secondary | ICD-10-CM | POA: Diagnosis not present

## 2020-06-27 DIAGNOSIS — R112 Nausea with vomiting, unspecified: Secondary | ICD-10-CM | POA: Diagnosis not present

## 2020-06-27 DIAGNOSIS — B009 Herpesviral infection, unspecified: Secondary | ICD-10-CM | POA: Diagnosis not present

## 2020-06-27 DIAGNOSIS — F17210 Nicotine dependence, cigarettes, uncomplicated: Secondary | ICD-10-CM | POA: Diagnosis not present

## 2020-06-27 DIAGNOSIS — I429 Cardiomyopathy, unspecified: Secondary | ICD-10-CM | POA: Diagnosis not present

## 2020-06-27 DIAGNOSIS — A09 Infectious gastroenteritis and colitis, unspecified: Secondary | ICD-10-CM | POA: Diagnosis not present

## 2020-06-27 DIAGNOSIS — G8929 Other chronic pain: Secondary | ICD-10-CM | POA: Diagnosis not present

## 2020-06-27 DIAGNOSIS — R9389 Abnormal findings on diagnostic imaging of other specified body structures: Secondary | ICD-10-CM | POA: Diagnosis not present

## 2020-06-27 DIAGNOSIS — Z8719 Personal history of other diseases of the digestive system: Secondary | ICD-10-CM | POA: Diagnosis not present

## 2020-06-27 DIAGNOSIS — Z888 Allergy status to other drugs, medicaments and biological substances status: Secondary | ICD-10-CM | POA: Diagnosis not present

## 2020-06-27 DIAGNOSIS — Z86010 Personal history of colonic polyps: Secondary | ICD-10-CM | POA: Diagnosis not present

## 2020-06-27 DIAGNOSIS — F419 Anxiety disorder, unspecified: Secondary | ICD-10-CM | POA: Diagnosis not present

## 2020-06-27 DIAGNOSIS — Z820 Family history of epilepsy and other diseases of the nervous system: Secondary | ICD-10-CM | POA: Diagnosis not present

## 2020-06-27 DIAGNOSIS — K209 Esophagitis, unspecified without bleeding: Secondary | ICD-10-CM | POA: Diagnosis not present

## 2020-06-27 DIAGNOSIS — Z0189 Encounter for other specified special examinations: Secondary | ICD-10-CM | POA: Diagnosis not present

## 2020-06-27 DIAGNOSIS — Z8249 Family history of ischemic heart disease and other diseases of the circulatory system: Secondary | ICD-10-CM | POA: Diagnosis not present

## 2020-06-27 DIAGNOSIS — Z9049 Acquired absence of other specified parts of digestive tract: Secondary | ICD-10-CM | POA: Diagnosis not present

## 2020-06-27 DIAGNOSIS — Z7982 Long term (current) use of aspirin: Secondary | ICD-10-CM | POA: Diagnosis not present

## 2020-06-27 DIAGNOSIS — Z79899 Other long term (current) drug therapy: Secondary | ICD-10-CM | POA: Diagnosis not present

## 2020-06-27 DIAGNOSIS — Z90710 Acquired absence of both cervix and uterus: Secondary | ICD-10-CM | POA: Diagnosis not present

## 2020-06-27 DIAGNOSIS — R935 Abnormal findings on diagnostic imaging of other abdominal regions, including retroperitoneum: Secondary | ICD-10-CM | POA: Diagnosis not present

## 2020-06-27 DIAGNOSIS — I739 Peripheral vascular disease, unspecified: Secondary | ICD-10-CM | POA: Diagnosis not present

## 2020-06-27 DIAGNOSIS — K529 Noninfective gastroenteritis and colitis, unspecified: Secondary | ICD-10-CM | POA: Diagnosis not present

## 2020-06-27 DIAGNOSIS — R197 Diarrhea, unspecified: Secondary | ICD-10-CM | POA: Diagnosis not present

## 2020-06-27 DIAGNOSIS — R1011 Right upper quadrant pain: Secondary | ICD-10-CM | POA: Diagnosis not present

## 2020-06-27 DIAGNOSIS — Z720 Tobacco use: Secondary | ICD-10-CM | POA: Diagnosis not present

## 2020-06-27 DIAGNOSIS — I10 Essential (primary) hypertension: Secondary | ICD-10-CM | POA: Diagnosis not present

## 2020-06-27 DIAGNOSIS — A419 Sepsis, unspecified organism: Secondary | ICD-10-CM | POA: Diagnosis not present

## 2020-06-27 DIAGNOSIS — Z743 Need for continuous supervision: Secondary | ICD-10-CM | POA: Diagnosis not present

## 2020-06-27 DIAGNOSIS — K208 Other esophagitis without bleeding: Secondary | ICD-10-CM | POA: Diagnosis not present

## 2020-06-27 DIAGNOSIS — K219 Gastro-esophageal reflux disease without esophagitis: Secondary | ICD-10-CM | POA: Diagnosis not present

## 2020-07-05 DIAGNOSIS — I10 Essential (primary) hypertension: Secondary | ICD-10-CM | POA: Diagnosis not present

## 2020-07-12 ENCOUNTER — Telehealth: Payer: Self-pay

## 2020-07-12 ENCOUNTER — Other Ambulatory Visit: Payer: Self-pay | Admitting: *Deleted

## 2020-07-12 DIAGNOSIS — F5101 Primary insomnia: Secondary | ICD-10-CM

## 2020-07-12 DIAGNOSIS — G8929 Other chronic pain: Secondary | ICD-10-CM

## 2020-07-12 MED ORDER — OXYCODONE HCL 10 MG PO TABS: ORAL_TABLET | ORAL | 0 refills | Status: DC

## 2020-07-12 MED ORDER — ZOLPIDEM TARTRATE 10 MG PO TABS: ORAL_TABLET | ORAL | 0 refills | Status: DC

## 2020-07-12 NOTE — Telephone Encounter (Signed)
Pt has been in [place] for a long time and has not returned at all for a visit.  Will she possibly be here over the holidays so she can be seen and get a new contract signed, labs, f/u?  It's been too long and I am not comfortable filling her meds from a distance without seeing her.

## 2020-07-12 NOTE — Telephone Encounter (Signed)
I called Theresa Carey back to let her know that you would refill her medication at  this time, however she has to come in for labs, and a follow up and sign a contract before she get anymore refills She had called back to inform us that although she is living with her mom, She is now in Hospice care (mom).

## 2020-07-12 NOTE — Telephone Encounter (Signed)
Sent to Dr. Reed for approval  

## 2020-07-12 NOTE — Telephone Encounter (Signed)
Patient stated that she can not come in over the Holidays because she is taking care of her mother.  Her question was why didn't we call her before it was time for the refill and tell her this. She also said we should have let her know ahead of time. I did offer her to see another provider in [place] but she did not respond to that. Finally she said you just can't do her like that tell her these things on the day it's time to refill. Please advise.

## 2020-07-12 NOTE — Telephone Encounter (Signed)
Patient requested refill No Contract on File Epic LR: 06/11/2020 Pended Rx and sent to Dr. Mariea Clonts for approval.     Patient stated that she just wanted to let you know that she was in the hospital again and they performed a Endoscopy that showed inflammation. Stated that her potassium was low at 2.9 because of Diarrhea. They gave her Flagyl and Ceftin for a UTI.  Stated that they will be sending you the Hospital report.

## 2020-07-12 NOTE — Telephone Encounter (Signed)
Please call her and find out if she's around over the holidays to come in and be seen and do a contract.

## 2020-07-13 DIAGNOSIS — Z23 Encounter for immunization: Secondary | ICD-10-CM | POA: Diagnosis not present

## 2020-07-17 ENCOUNTER — Other Ambulatory Visit: Payer: Self-pay | Admitting: Internal Medicine

## 2020-07-26 DIAGNOSIS — R55 Syncope and collapse: Secondary | ICD-10-CM | POA: Diagnosis not present

## 2020-07-26 DIAGNOSIS — Z720 Tobacco use: Secondary | ICD-10-CM | POA: Diagnosis not present

## 2020-07-26 DIAGNOSIS — F419 Anxiety disorder, unspecified: Secondary | ICD-10-CM | POA: Diagnosis not present

## 2020-07-26 DIAGNOSIS — I429 Cardiomyopathy, unspecified: Secondary | ICD-10-CM | POA: Diagnosis not present

## 2020-07-26 DIAGNOSIS — I70209 Unspecified atherosclerosis of native arteries of extremities, unspecified extremity: Secondary | ICD-10-CM | POA: Diagnosis not present

## 2020-07-26 DIAGNOSIS — I10 Essential (primary) hypertension: Secondary | ICD-10-CM | POA: Diagnosis not present

## 2020-07-26 DIAGNOSIS — G8929 Other chronic pain: Secondary | ICD-10-CM | POA: Diagnosis not present

## 2020-07-30 ENCOUNTER — Other Ambulatory Visit: Payer: Self-pay | Admitting: *Deleted

## 2020-07-30 ENCOUNTER — Other Ambulatory Visit: Payer: Self-pay | Admitting: Internal Medicine

## 2020-07-30 DIAGNOSIS — M792 Neuralgia and neuritis, unspecified: Secondary | ICD-10-CM

## 2020-07-30 DIAGNOSIS — B009 Herpesviral infection, unspecified: Secondary | ICD-10-CM

## 2020-07-30 MED ORDER — GABAPENTIN 100 MG PO CAPS: ORAL_CAPSULE | ORAL | 1 refills | Status: DC

## 2020-07-30 MED ORDER — LISINOPRIL 20 MG PO TABS
20.0000 mg | ORAL_TABLET | Freq: Every day | ORAL | 1 refills | Status: DC
Start: 2020-07-30 — End: 2021-06-05

## 2020-07-30 MED ORDER — METOPROLOL SUCCINATE ER 25 MG PO TB24
25.0000 mg | ORAL_TABLET | Freq: Two times a day (BID) | ORAL | 1 refills | Status: DC
Start: 2020-07-30 — End: 2021-06-05

## 2020-07-30 NOTE — Telephone Encounter (Signed)
Per Dr. Mariea Clonts patient needs to come in office before any more scripts can be filled.

## 2020-07-30 NOTE — Telephone Encounter (Signed)
Patient called requesting refills. Very upset, crying,  because she was told Dr. Mariea Clonts did not feel comfortable refilling until she was seen for an appointment in the office.  Patient cannot come into office due to being in [place] with her mom on Hospice. 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   Requesting refills.  Pended Rx's and sent to Dr. Mariea Clonts for approval.

## 2020-08-03 ENCOUNTER — Other Ambulatory Visit: Payer: Self-pay | Admitting: Internal Medicine

## 2020-08-03 DIAGNOSIS — B009 Herpesviral infection, unspecified: Secondary | ICD-10-CM

## 2020-08-03 NOTE — Telephone Encounter (Signed)
Spoke with patient to get a clarification on Protonix (pantoprazole), patient states she is taking pantoprazole twice daily versus once daily (on medication list)   Patient aware I will send to Hollace Kinnier L, DO for further review

## 2020-08-03 NOTE — Telephone Encounter (Signed)
Pharmacy sent refill Request.  Protonix is not in patient's current medication list (Pepcid is listed)  Confirmed with patient what she is taking and she is taking Protonix 40 mg daily.   Pended Rx's and sent to Dr. Mariea Clonts for approval.

## 2020-08-09 ENCOUNTER — Other Ambulatory Visit: Payer: Self-pay | Admitting: *Deleted

## 2020-08-09 DIAGNOSIS — G8929 Other chronic pain: Secondary | ICD-10-CM

## 2020-08-09 DIAGNOSIS — F5101 Primary insomnia: Secondary | ICD-10-CM

## 2020-08-09 DIAGNOSIS — R1011 Right upper quadrant pain: Secondary | ICD-10-CM

## 2020-08-09 DIAGNOSIS — F411 Generalized anxiety disorder: Secondary | ICD-10-CM

## 2020-08-09 MED ORDER — LORAZEPAM 0.5 MG PO TABS: ORAL_TABLET | ORAL | 0 refills | Status: DC

## 2020-08-09 MED ORDER — OXYCODONE HCL 10 MG PO TABS: ORAL_TABLET | ORAL | 0 refills | Status: DC

## 2020-08-09 MED ORDER — ZOLPIDEM TARTRATE 10 MG PO TABS: ORAL_TABLET | ORAL | 0 refills | Status: DC

## 2020-08-09 NOTE — Telephone Encounter (Signed)
Patient requested refill Epic LR: 07/12/2020 Pended Rx's and sent to Dr. Mariea Clonts for approval.

## 2020-08-17 ENCOUNTER — Other Ambulatory Visit: Payer: Self-pay | Admitting: Internal Medicine

## 2020-08-17 DIAGNOSIS — F411 Generalized anxiety disorder: Secondary | ICD-10-CM

## 2020-08-20 ENCOUNTER — Other Ambulatory Visit: Payer: Self-pay | Admitting: Internal Medicine

## 2020-08-20 ENCOUNTER — Other Ambulatory Visit: Payer: Self-pay | Admitting: *Deleted

## 2020-08-20 DIAGNOSIS — F411 Generalized anxiety disorder: Secondary | ICD-10-CM

## 2020-08-20 NOTE — Telephone Encounter (Signed)
Disregard previous message.  Refill Requested too early.

## 2020-08-20 NOTE — Telephone Encounter (Signed)
Pharmacy requested refill  Pharmacy LR: 08/07/2020 Needs Contract on File will add to next appointment.  Pended Rx and sent to [facility] for approval. (Dr. Renato Gails out of office.)

## 2020-08-20 NOTE — Telephone Encounter (Signed)
Sorry. Pharmacy LR 07/08/2020 (not 12/14) Re-Pended Rx and sent to [facility] for approval.

## 2020-08-20 NOTE — Telephone Encounter (Signed)
Medication is not approved because patient needs to come in for a visit and sign a contract. Per Dr. Renato Gails DO

## 2020-08-27 ENCOUNTER — Other Ambulatory Visit: Payer: Self-pay | Admitting: *Deleted

## 2020-08-27 MED ORDER — SIMVASTATIN 20 MG PO TABS: 20.0000 mg | ORAL_TABLET | Freq: Every day | ORAL | 1 refills | Status: DC

## 2020-08-27 NOTE — Telephone Encounter (Signed)
Pharmacy requested refill

## 2020-09-07 ENCOUNTER — Other Ambulatory Visit: Payer: Self-pay | Admitting: *Deleted

## 2020-09-07 DIAGNOSIS — F5101 Primary insomnia: Secondary | ICD-10-CM

## 2020-09-07 DIAGNOSIS — G8929 Other chronic pain: Secondary | ICD-10-CM

## 2020-09-07 DIAGNOSIS — R1011 Right upper quadrant pain: Secondary | ICD-10-CM

## 2020-09-07 MED ORDER — ZOLPIDEM TARTRATE 10 MG PO TABS: ORAL_TABLET | ORAL | 0 refills | Status: DC

## 2020-09-07 MED ORDER — OXYCODONE HCL 10 MG PO TABS: ORAL_TABLET | ORAL | 0 refills | Status: DC

## 2020-09-07 NOTE — Telephone Encounter (Signed)
Vaughan Basta, with [facility] in [place] called requesting refills.  Stated Pharmacy LR was 08/10/2020  Pended Rx and sent to Dr. Mariea Clonts for approval.

## 2020-09-18 ENCOUNTER — Other Ambulatory Visit: Payer: Self-pay | Admitting: *Deleted

## 2020-09-18 DIAGNOSIS — F411 Generalized anxiety disorder: Secondary | ICD-10-CM

## 2020-09-18 MED ORDER — LORAZEPAM 0.5 MG PO TABS
ORAL_TABLET | ORAL | 0 refills | Status: DC
Start: 2020-09-18 — End: 2020-10-18

## 2020-09-18 NOTE — Telephone Encounter (Signed)
Patient called requesting refill.  Stated that she wanted to let you know her mother died, she is going to be selling the house and will be back home.   Pended Rx and sent to Dr. Mariea Clonts for approval.

## 2020-10-04 ENCOUNTER — Other Ambulatory Visit: Payer: Self-pay

## 2020-10-04 DIAGNOSIS — F5101 Primary insomnia: Secondary | ICD-10-CM

## 2020-10-04 MED ORDER — ZOLPIDEM TARTRATE 10 MG PO TABS: ORAL_TABLET | ORAL | 0 refills | Status: DC

## 2020-10-04 NOTE — Telephone Encounter (Signed)
RX last filled 09/07/2020  No treatment agreement on file and no pending appointment. Patients last routine visit with Gayland Curry, DO was July 2020 (patient seen acutely several times by [facility]) Message sent to administrative staff to contact patient for appointment

## 2020-10-09 ENCOUNTER — Other Ambulatory Visit: Payer: Self-pay | Admitting: *Deleted

## 2020-10-09 DIAGNOSIS — G8929 Other chronic pain: Secondary | ICD-10-CM

## 2020-10-09 DIAGNOSIS — R1011 Right upper quadrant pain: Secondary | ICD-10-CM

## 2020-10-09 MED ORDER — OXYCODONE HCL 10 MG PO TABS: ORAL_TABLET | ORAL | 0 refills | Status: DC

## 2020-10-09 NOTE — Telephone Encounter (Signed)
Patient requested refill Epic LR: 09/07/2020 Pended Rx and sent to Dr. Mariea Clonts for approval.

## 2020-10-15 ENCOUNTER — Encounter: Payer: Self-pay | Admitting: Internal Medicine

## 2020-10-18 ENCOUNTER — Other Ambulatory Visit: Payer: Self-pay | Admitting: Internal Medicine

## 2020-10-18 DIAGNOSIS — F411 Generalized anxiety disorder: Secondary | ICD-10-CM

## 2020-10-18 NOTE — Telephone Encounter (Signed)
Pharmacy requested refill Epic LR: 09/18/2020 Pended Rx and sent to Dr. Mariea Clonts for approval.

## 2020-11-05 ENCOUNTER — Other Ambulatory Visit: Payer: Self-pay | Admitting: *Deleted

## 2020-11-05 DIAGNOSIS — G8929 Other chronic pain: Secondary | ICD-10-CM

## 2020-11-05 DIAGNOSIS — R1011 Right upper quadrant pain: Secondary | ICD-10-CM

## 2020-11-05 DIAGNOSIS — F5101 Primary insomnia: Secondary | ICD-10-CM

## 2020-11-05 MED ORDER — ZOLPIDEM TARTRATE 10 MG PO TABS
ORAL_TABLET | ORAL | 0 refills | Status: DC
Start: 2020-11-05 — End: 2020-12-05

## 2020-11-05 MED ORDER — OXYCODONE HCL 10 MG PO TABS: ORAL_TABLET | ORAL | 0 refills | Status: DC

## 2020-11-05 NOTE — Telephone Encounter (Signed)
Patient requested refill Epic LR: 10/09/2020 Pended Rx and sent to Dr. Mariea Clonts for approval.

## 2020-11-16 ENCOUNTER — Other Ambulatory Visit: Payer: Self-pay

## 2020-11-16 DIAGNOSIS — F411 Generalized anxiety disorder: Secondary | ICD-10-CM

## 2020-11-16 MED ORDER — LORAZEPAM 0.5 MG PO TABS: ORAL_TABLET | ORAL | 0 refills | Status: DC

## 2020-11-16 NOTE — Telephone Encounter (Signed)
Refill request received from pharmacy  Pended and sent to Marlowe Sax, NP

## 2020-11-25 DIAGNOSIS — K219 Gastro-esophageal reflux disease without esophagitis: Secondary | ICD-10-CM | POA: Diagnosis not present

## 2020-11-25 DIAGNOSIS — Z7982 Long term (current) use of aspirin: Secondary | ICD-10-CM | POA: Diagnosis not present

## 2020-11-25 DIAGNOSIS — F32A Depression, unspecified: Secondary | ICD-10-CM | POA: Diagnosis not present

## 2020-11-25 DIAGNOSIS — Z008 Encounter for other general examination: Secondary | ICD-10-CM | POA: Diagnosis not present

## 2020-11-25 DIAGNOSIS — G8929 Other chronic pain: Secondary | ICD-10-CM | POA: Diagnosis not present

## 2020-11-25 DIAGNOSIS — F419 Anxiety disorder, unspecified: Secondary | ICD-10-CM | POA: Diagnosis not present

## 2020-11-25 DIAGNOSIS — F17200 Nicotine dependence, unspecified, uncomplicated: Secondary | ICD-10-CM | POA: Diagnosis not present

## 2020-11-25 DIAGNOSIS — F17210 Nicotine dependence, cigarettes, uncomplicated: Secondary | ICD-10-CM | POA: Diagnosis not present

## 2020-11-25 DIAGNOSIS — Z79899 Other long term (current) drug therapy: Secondary | ICD-10-CM | POA: Diagnosis not present

## 2020-11-25 DIAGNOSIS — Z79891 Long term (current) use of opiate analgesic: Secondary | ICD-10-CM | POA: Diagnosis not present

## 2020-11-25 DIAGNOSIS — I10 Essential (primary) hypertension: Secondary | ICD-10-CM | POA: Diagnosis not present

## 2020-12-05 ENCOUNTER — Other Ambulatory Visit: Payer: Self-pay

## 2020-12-05 DIAGNOSIS — F5101 Primary insomnia: Secondary | ICD-10-CM

## 2020-12-05 DIAGNOSIS — G8929 Other chronic pain: Secondary | ICD-10-CM

## 2020-12-05 MED ORDER — OXYCODONE HCL 10 MG PO TABS: ORAL_TABLET | ORAL | 0 refills | Status: DC

## 2020-12-05 MED ORDER — ZOLPIDEM TARTRATE 10 MG PO TABS: ORAL_TABLET | ORAL | 0 refills | Status: DC

## 2020-12-05 NOTE — Telephone Encounter (Signed)
Incoming call received from patient requesting refill for ambien and oxycodone.  Both rx's last refilled in Epic on 11/05/2020. No treatment agreement on file and no pending appointment.   I called patient and informed her that she will need appointment prior to next refill. Appointment scheduled for 01/01/2021

## 2020-12-18 ENCOUNTER — Other Ambulatory Visit: Payer: Self-pay | Admitting: Family

## 2020-12-18 DIAGNOSIS — F411 Generalized anxiety disorder: Secondary | ICD-10-CM

## 2020-12-18 NOTE — Telephone Encounter (Signed)
RX last filled on 11/16/2020, no treatment agreement on file. Patient to sign treatment agreement at pending appointment next month

## 2020-12-19 ENCOUNTER — Other Ambulatory Visit: Payer: Self-pay | Admitting: *Deleted

## 2020-12-19 DIAGNOSIS — F411 Generalized anxiety disorder: Secondary | ICD-10-CM

## 2020-12-19 NOTE — Telephone Encounter (Signed)
Needs contract signed but this will be problematic since she spends much of her time in [place] and we do not have a way to monitor refills etc.

## 2020-12-19 NOTE — Telephone Encounter (Signed)
Patient requested refill. Patient currently living in [place] taking care of Parents (parents have recently deceased)  Daughter is planning on moving back home.  Has an upcoming appointment with Dr. Sabra Heck.  Epic LR: 11/16/2020 Will update contract at upcoming appointment.  Pended Rx and sent to Dr. Sabra Heck for approval.

## 2020-12-26 MED ORDER — LORAZEPAM 0.5 MG PO TABS: ORAL_TABLET | ORAL | 0 refills | Status: DC

## 2021-01-01 ENCOUNTER — Other Ambulatory Visit: Payer: Self-pay

## 2021-01-01 ENCOUNTER — Ambulatory Visit (INDEPENDENT_AMBULATORY_CARE_PROVIDER_SITE_OTHER): Payer: Medicare Other | Admitting: Family Medicine

## 2021-01-01 ENCOUNTER — Encounter: Payer: Self-pay | Admitting: Family Medicine

## 2021-01-01 VITALS — BP 132/88 | HR 86 | Temp 96.8°F | Ht 65.0 in | Wt 126.2 lb

## 2021-01-01 DIAGNOSIS — F419 Anxiety disorder, unspecified: Secondary | ICD-10-CM

## 2021-01-01 DIAGNOSIS — R1011 Right upper quadrant pain: Secondary | ICD-10-CM

## 2021-01-01 DIAGNOSIS — Z79899 Other long term (current) drug therapy: Secondary | ICD-10-CM | POA: Diagnosis not present

## 2021-01-01 DIAGNOSIS — M792 Neuralgia and neuritis, unspecified: Secondary | ICD-10-CM

## 2021-01-01 DIAGNOSIS — G8929 Other chronic pain: Secondary | ICD-10-CM | POA: Diagnosis not present

## 2021-01-01 MED ORDER — ESCITALOPRAM OXALATE 10 MG PO TABS: 10.0000 mg | ORAL_TABLET | Freq: Every day | ORAL | 1 refills | Status: DC

## 2021-01-01 MED ORDER — GABAPENTIN 100 MG PO CAPS
ORAL_CAPSULE | ORAL | 1 refills | Status: DC
Start: 2021-01-01 — End: 2021-07-30

## 2021-01-01 MED ORDER — OXYCODONE HCL ER 20 MG PO T12A: 20.0000 mg | EXTENDED_RELEASE_TABLET | Freq: Two times a day (BID) | ORAL | 0 refills | Status: DC

## 2021-01-01 NOTE — Patient Instructions (Signed)
Start Lexapro 10 mg a day for 1 week and then moved to 2 tablets a day thereafter

## 2021-01-01 NOTE — Progress Notes (Signed)
Provider:  Alain Honey, MD  Careteam: Patient Care Team: [name], MD as PCP - General (Family Medicine)  PLACE OF SERVICE:  [place] Directive information Does Patient Have a Medical Advance Directive?: No, Would patient like information on creating a medical advance directive?: No - Patient declined  Allergies  Allergen Reactions  . Avelox [Moxifloxacin Hcl In Nacl]     Low blood pressure  . Compazine [Prochlorperazine Edisylate] Other (See Comments)    Caused lockjaw  . Ibuprofen Hives  . Morphine And Related Nausea And Vomiting  . Promethazine Hcl Nausea And Vomiting    Chief Complaint  Patient presents with  . Medical Management of Chronic Issues    9 month follow-up and discuss need for mammogram or exclude. Update treatment agreements for Oxycodone, Ambien, and Ativan.  Discuss restarting Lexapro. Refill Gsbapentin to [place] pharmacy, patient will be traveling back tomorrow      HPI: Patient is a 65 y.o. female known this lady for many years now.  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  She has had chronic ongoing right upper quadrant pain with nausea and vomiting.  Nobody has ever found a reason for her symptoms.  Recently she has been alternating between living in [place] and [place] where she was taking care of her parents both of which have now diet.  Plan is to close out her residence in [place] and moved back here full-time She has not had any recent increase in pain or symptoms.  Did have episode of nausea and vomiting recently.  Is a sometime frequent visitor to the ER for her symptoms.  Review of Systems:  Review of Systems  Constitutional: Positive for weight loss.  Respiratory: Negative.   Cardiovascular: Negative.   Gastrointestinal: Positive for abdominal pain.  Psychiatric/Behavioral: The patient is nervous/anxious and has insomnia.   All other systems reviewed and are  negative.   Past Medical History:  Diagnosis Date  . Abdominal pain 2000   chronic abdominal pain with nausea and vomiting  . Abnormal liver function   . Allergy   . Hypertension    Past Surgical History:  Procedure Laterality Date  . ABDOMINAL HYSTERECTOMY    . CARDIAC CATHETERIZATION  10/11/2019  . CHOLECYSTECTOMY     Social History:   reports that she has been smoking cigarettes. She has been smoking about 0.50 packs per day. She has never used smokeless tobacco. She reports current alcohol use. She reports that she does not use drugs.  Family History  Problem Relation Age of Onset  . Hypertension Father   . Cancer - Other Father        Bladder  . Dementia Father   . Hypertension Mother   . Parkinson's disease Mother   . Diabetes Maternal Grandmother   . Cancer - Other Maternal Grandmother        Pancreatic  . Cancer - Other Paternal Grandmother        Bone    Medications: Patient's Medications  New Prescriptions   ESCITALOPRAM (LEXAPRO) 10 MG TABLET    Take 1 tablet (10 mg total) by mouth daily.   OXYCODONE (OXYCONTIN) 20 MG 12 HR TABLET    Take 1 tablet (20 mg total) by mouth every 12 (twelve) hours.  Previous Medications   ASPIRIN 81 MG CHEWABLE TABLET    Chew 81 mg by mouth daily.    BIOTIN 1 MG CAPS  Take 1 mg by mouth daily at 6 (six) AM.   CHOLECALCIFEROL (VITAMIN D) 2000 UNITS CAPS    Take 1 capsule (2,000 Units total) by mouth daily.   FAMOTIDINE (PEPCID) 40 MG TABLET    take 1 tablet by mouth once daily   LISINOPRIL (ZESTRIL) 20 MG TABLET    Take 1 tablet (20 mg total) by mouth daily.   LORAZEPAM (ATIVAN) 0.5 MG TABLET    take 1 tablet by mouth every 6 hours if needed   METOPROLOL SUCCINATE (TOPROL-XL) 25 MG 24 HR TABLET    Take 1 tablet (25 mg total) by mouth 2 (two) times daily. Morning and Night.   PANTOPRAZOLE (PROTONIX) 40 MG TABLET    take 1 tablet by mouth twice a day   SIMVASTATIN (ZOCOR) 20 MG TABLET    Take 1 tablet (20 mg total) by mouth at  bedtime.   VALACYCLOVIR (VALTREX) 500 MG TABLET    take 1 tablet by mouth twice a day   ZOLPIDEM (AMBIEN) 10 MG TABLET    Take one tablet by mouth at bedtime for sleep  Modified Medications   Modified Medication Previous Medication   GABAPENTIN (NEURONTIN) 100 MG CAPSULE gabapentin (NEURONTIN) 100 MG capsule      take 1 capsule by mouth three times a day    take 1 capsule by mouth three times a day  Discontinued Medications   ESCITALOPRAM (LEXAPRO) 20 MG TABLET    Take 10 mg tablet one by mouth daily x 7 days then  Take 5 mg tablet (1/2 Tablet) by mouth daily x 7 days then  Take 5 mg tablet one by mouth every other day x 7 days then stop and start Trintellix as directed.   LORAZEPAM (ATIVAN) 0.5 MG TABLET    take 1 tablet by mouth every 6 hours if needed   OXYCODONE HCL 10 MG TABS    Take one tablet by mouth every 6 hours as needed for pain   PANTOPRAZOLE (PROTONIX) 40 MG TABLET    Take 40 mg by mouth daily.   VORTIOXETINE HBR (TRINTELLIX) 10 MG TABS TABLET    Take 1 tablet (10 mg total) by mouth daily.    Physical Exam:  Vitals:   01/01/21 1354  BP: 132/88  Pulse: 86  Temp: (!) 96.8 F (36 C)  TempSrc: Temporal  SpO2: 98%  Weight: 126 lb 3.2 oz (57.2 kg)  Height: 5\' 5"  (1.651 m)   Body mass index is 21 kg/m. Wt Readings from Last 3 Encounters:  01/01/21 126 lb 3.2 oz (57.2 kg)  11/08/18 120 lb (54.4 kg)  07/05/18 125 lb (56.7 kg)    Physical Exam Vitals and nursing note reviewed.  Constitutional:      Appearance: Normal appearance.  Cardiovascular:     Rate and Rhythm: Normal rate and regular rhythm.  Pulmonary:     Effort: Pulmonary effort is normal.     Breath sounds: Normal breath sounds.  Abdominal:     General: Bowel sounds are normal.  Neurological:     General: No focal deficit present.     Mental Status: She is alert and oriented to person, place, and time.  Psychiatric:        Mood and Affect: Mood normal.        Behavior: Behavior normal.     Labs  reviewed: Basic Metabolic Panel: No results for input(s): NA, K, CL, CO2, GLUCOSE, BUN, CREATININE, CALCIUM, MG, PHOS, TSH in the last 8760 hours.  Liver Function Tests: No results for input(s): AST, ALT, ALKPHOS, BILITOT, PROT, ALBUMIN in the last 8760 hours. No results for input(s): LIPASE, AMYLASE in the last 8760 hours. No results for input(s): AMMONIA in the last 8760 hours. CBC: No results for input(s): WBC, NEUTROABS, HGB, HCT, MCV, PLT in the last 8760 hours. Lipid Panel: No results for input(s): CHOL, HDL, LDLCALC, TRIG, CHOLHDL, LDLDIRECT in the last 8760 hours. TSH: No results for input(s): TSH in the last 8760 hours. A1C: Lab Results  Component Value Date   HGBA1C 5.4 11/08/2018     Assessment/Plan  1. Neuropathic pain, leg, right  - gabapentin (NEURONTIN) 100 MG capsule; take 1 capsule by mouth three times a day  Dispense: 270 capsule; Refill: 1  2. High risk medication use Patient understands need for drug contracts given her need for narcotics - DRUG MONITOR, PANEL 1, W/CONF, URINE  3. Chronic RUQ pain Request increase in her oxycodone from 10 to 20 mg.  Even though I would rather not have to fill this medicine I think she has demonstrated a need over the years.  I do think that she is prone to develop a tolerance to a lower dose so I am willing to increase from 10 to 20 mg today of oxycodone  4. Anxiety She continues with lorazepam.  Did also discontinue her old antidepressant in favor of Lexapro 10 mg for 1 week then advance to 20 mg   Alain Honey, MD [place] [phone]

## 2021-01-03 ENCOUNTER — Telehealth: Payer: Self-pay | Admitting: *Deleted

## 2021-01-03 NOTE — Telephone Encounter (Signed)
Initiated Prior Authorization for Oxycodone through [employer] [phone] Member ID: [id number] [place]: [id number] [id number] Grp: [id number]  Called and requested Prior Authorization form to be faxed to our office to initiate.   Awaiting fax from Insurance.

## 2021-01-03 NOTE — Telephone Encounter (Signed)
Received Prior Authorization fax from [facility] [phone] Fax:[phone].  Filled out and placed in Dr. Ammie Ferrier folder to fill out and sign.

## 2021-01-04 LAB — DRUG MONITOR, PANEL 1, W/CONF, URINE
Amphetamines: NEGATIVE ng/mL (ref <500)
Barbiturates: NEGATIVE ng/mL (ref <300)
Benzodiazepines: NEGATIVE ng/mL (ref <100)
Cocaine Metabolite: NEGATIVE ng/mL (ref <150)
Codeine: NEGATIVE ng/mL (ref <50)
Creatinine: 299.8 mg/dL
Hydrocodone: NEGATIVE ng/mL (ref <50)
Hydromorphone: NEGATIVE ng/mL (ref <50)
Marijuana Metabolite: NEGATIVE ng/mL (ref <20)
Methadone Metabolite: NEGATIVE ng/mL (ref <100)
Morphine: NEGATIVE ng/mL (ref <50)
Norhydrocodone: NEGATIVE ng/mL (ref <50)
Noroxycodone: >10000 ng/mL — ABNORMAL HIGH (ref <50)
Opiates: NEGATIVE ng/mL (ref <100)
Oxidant: NEGATIVE ug/mL
Oxycodone: >10000 ng/mL — ABNORMAL HIGH (ref <50)
Oxycodone: POSITIVE ng/mL — AB (ref <100)
Oxymorphone: >10000 ng/mL — ABNORMAL HIGH (ref <50)
Phencyclidine: NEGATIVE ng/mL (ref <25)
pH: 5.5 (ref 4.5–9.0)

## 2021-01-04 LAB — DM TEMPLATE

## 2021-01-10 ENCOUNTER — Other Ambulatory Visit: Payer: Self-pay | Admitting: Orthopedic Surgery

## 2021-01-10 DIAGNOSIS — R1011 Right upper quadrant pain: Secondary | ICD-10-CM

## 2021-01-10 DIAGNOSIS — G8929 Other chronic pain: Secondary | ICD-10-CM

## 2021-01-10 MED ORDER — OXYCODONE HCL ER 20 MG PO T12A: 20.0000 mg | EXTENDED_RELEASE_TABLET | Freq: Two times a day (BID) | ORAL | 0 refills | Status: DC

## 2021-01-10 NOTE — Telephone Encounter (Signed)
I will prescribe one more week. This needs to be refilled by Dr. Sabra Heck in the future.

## 2021-01-10 NOTE — Telephone Encounter (Signed)
Patient called office back and notified me that insurance wont approve "Prior Authorization".Patient was informed that' she can choose to appeal. In the meantime patient states she would like Oxycodone sent into the pharmacy. Patient states that she will pay Out of Pocket for medication. Message routed to Windell Moulding, NP who is in office today.

## 2021-01-10 NOTE — Telephone Encounter (Signed)
Called patient to inform her.However patient did not answer. Unable to leave voicemail. 

## 2021-01-10 NOTE — Telephone Encounter (Signed)
Called patient to inform her.However patient did not answer. Unable to leave voicemail.

## 2021-01-10 NOTE — Telephone Encounter (Signed)
Prescription was sent by Dr. Sabra Heck. She needs to contact her pharmacy.

## 2021-01-10 NOTE — Telephone Encounter (Signed)
Medication sent by Dr.Miller was only 7 day supply on 01/01/2021. Message routed back to Windell Moulding, NP

## 2021-01-10 NOTE — Telephone Encounter (Signed)
Late entry, incoming fax received from [facility], paperwork submitted was missing information. Missing information completed and resubmitted yesterday 01/09/2021 at 12 pm. Message was left on voicemail informing patient

## 2021-01-14 ENCOUNTER — Telehealth: Payer: Self-pay | Admitting: *Deleted

## 2021-01-14 DIAGNOSIS — R1011 Right upper quadrant pain: Secondary | ICD-10-CM

## 2021-01-14 DIAGNOSIS — F5101 Primary insomnia: Secondary | ICD-10-CM

## 2021-01-14 DIAGNOSIS — G8929 Other chronic pain: Secondary | ICD-10-CM

## 2021-01-14 NOTE — Telephone Encounter (Signed)
Patient called and stated that [facility] will NOT cover the Oxycodone 20mg  and told her that she would need to go back on the Oxycodone 10mg . ([employer] has DENIED Prior Authorization).   1.) Patient is wondering if she could try Dilaudid and see if that works.   2.) Requesting refill on Ambien also. Pended and sent to Dr. Sabra Heck for approval.   Please Advise.   (Patient aware that Dr. Sabra Heck will review message on Tuesday)

## 2021-01-14 NOTE — Telephone Encounter (Signed)
Called patient again to notify her of 1 week given by Windell Moulding, NP. Patient also would like Dr.Miller to send something in to pharmacy as well when he returns 01/15/2021. Message routed to PCP Sabra Heck Lillette Boxer, MD .

## 2021-01-15 NOTE — Telephone Encounter (Signed)
Patient called back with the same requests.

## 2021-01-16 ENCOUNTER — Other Ambulatory Visit: Payer: Self-pay | Admitting: Family Medicine

## 2021-01-16 MED ORDER — OXYCODONE HCL ER 10 MG PO T12A: 10.0000 mg | EXTENDED_RELEASE_TABLET | Freq: Four times a day (QID) | ORAL | 0 refills | Status: DC

## 2021-01-16 MED ORDER — ZOLPIDEM TARTRATE 10 MG PO TABS: ORAL_TABLET | ORAL | 0 refills | Status: DC

## 2021-01-16 NOTE — Telephone Encounter (Signed)
Patient notified and agreed. Stated that she would like to go back to the Oxycodone 10mg .  Pended and sent to Dr. Sabra Heck for approval.

## 2021-01-16 NOTE — Telephone Encounter (Signed)
I would like to hold off on Dilaudid and would prefer using oxycodone in the 10 mg strength as [employer] has requested

## 2021-01-17 ENCOUNTER — Telehealth: Payer: Self-pay

## 2021-01-17 NOTE — Telephone Encounter (Addendum)
Incoming call received from patient stating we need to call [facility] @ [phone] due to her still not receiving Oxycodone.  I called number as requested by the patient and requested for [employer] to have them fax paperwork to the office for PA completion for Oxycodone 10 mg as listed on current medication list. Awaiting Fax   If we need to check a status on Prior Authorization, reference number is [id number]  Turnaround time to process fax after paperwork received from provider will be 48-72 hours

## 2021-01-17 NOTE — Addendum Note (Signed)
Addended by: Rafael Bihari A on: 01/17/2021 10:49 AM   Modules accepted: Orders

## 2021-01-17 NOTE — Telephone Encounter (Signed)
Patient wanted to go back on the regular Oxycodone 10mg  that she was currently taking not the 12 hour tablet that I entered because insurance will not cover it.   Changed to correct dose and sent to Amy for approval (due to Dr. Sabra Heck out of office)

## 2021-01-17 NOTE — Addendum Note (Signed)
Addended by: Rafael Bihari A on: 01/17/2021 11:20 AM   Modules accepted: Orders

## 2021-01-18 ENCOUNTER — Other Ambulatory Visit: Payer: Self-pay | Admitting: Orthopedic Surgery

## 2021-01-18 MED ORDER — OXYCODONE HCL 10 MG PO TABS: ORAL_TABLET | ORAL | 0 refills | Status: DC

## 2021-01-18 NOTE — Addendum Note (Signed)
Addended byWindell Moulding E on: 01/18/2021 08:58 AM   Modules accepted: Orders

## 2021-01-18 NOTE — Telephone Encounter (Signed)
Patient wanted to go back on the regular Oxycodone 10mg  that she was currently taking not the 12 hour tablet that I entered because insurance will not cover it.   Changed to correct dose and sent to Amy for approval (due to Dr. Sabra Heck out of office)     Theresa Alanis, NP  You 1 hour ago (9:00 AM)      Prescription signed and sent.

## 2021-01-18 NOTE — Addendum Note (Signed)
Addended by: Rafael Bihari A on: 01/18/2021 08:45 AM   Modules accepted: Orders

## 2021-01-18 NOTE — Telephone Encounter (Signed)
Prescription signed and sent.

## 2021-01-18 NOTE — Telephone Encounter (Signed)
Patient called. Pharmacy hasn't received Rx.  Unsigned order resent to Amy for approval.

## 2021-02-08 DIAGNOSIS — F419 Anxiety disorder, unspecified: Secondary | ICD-10-CM | POA: Diagnosis not present

## 2021-02-08 DIAGNOSIS — Z720 Tobacco use: Secondary | ICD-10-CM | POA: Diagnosis not present

## 2021-02-08 DIAGNOSIS — I739 Peripheral vascular disease, unspecified: Secondary | ICD-10-CM | POA: Diagnosis not present

## 2021-02-08 DIAGNOSIS — I10 Essential (primary) hypertension: Secondary | ICD-10-CM | POA: Diagnosis not present

## 2021-02-08 DIAGNOSIS — I429 Cardiomyopathy, unspecified: Secondary | ICD-10-CM | POA: Diagnosis not present

## 2021-02-08 DIAGNOSIS — E785 Hyperlipidemia, unspecified: Secondary | ICD-10-CM | POA: Diagnosis not present

## 2021-02-11 ENCOUNTER — Other Ambulatory Visit: Payer: Self-pay | Admitting: Orthopedic Surgery

## 2021-02-11 DIAGNOSIS — G8929 Other chronic pain: Secondary | ICD-10-CM

## 2021-02-11 DIAGNOSIS — R1011 Right upper quadrant pain: Secondary | ICD-10-CM

## 2021-02-11 DIAGNOSIS — I429 Cardiomyopathy, unspecified: Secondary | ICD-10-CM | POA: Diagnosis not present

## 2021-02-11 DIAGNOSIS — F419 Anxiety disorder, unspecified: Secondary | ICD-10-CM | POA: Diagnosis not present

## 2021-02-11 DIAGNOSIS — E785 Hyperlipidemia, unspecified: Secondary | ICD-10-CM | POA: Diagnosis not present

## 2021-02-11 DIAGNOSIS — Z720 Tobacco use: Secondary | ICD-10-CM | POA: Diagnosis not present

## 2021-02-11 DIAGNOSIS — I10 Essential (primary) hypertension: Secondary | ICD-10-CM | POA: Diagnosis not present

## 2021-02-11 DIAGNOSIS — I739 Peripheral vascular disease, unspecified: Secondary | ICD-10-CM | POA: Diagnosis not present

## 2021-02-11 DIAGNOSIS — I70222 Atherosclerosis of native arteries of extremities with rest pain, left leg: Secondary | ICD-10-CM | POA: Diagnosis not present

## 2021-02-11 DIAGNOSIS — I77811 Abdominal aortic ectasia: Secondary | ICD-10-CM | POA: Diagnosis not present

## 2021-02-11 DIAGNOSIS — F17210 Nicotine dependence, cigarettes, uncomplicated: Secondary | ICD-10-CM | POA: Diagnosis not present

## 2021-02-11 NOTE — Telephone Encounter (Signed)
RX last filled on 01/18/2021 for #120, treatment agreement on fie from 01/01/21

## 2021-02-14 ENCOUNTER — Other Ambulatory Visit: Payer: Self-pay | Admitting: *Deleted

## 2021-02-14 DIAGNOSIS — F5101 Primary insomnia: Secondary | ICD-10-CM

## 2021-02-14 MED ORDER — ZOLPIDEM TARTRATE 10 MG PO TABS: ORAL_TABLET | ORAL | 0 refills | Status: DC

## 2021-02-14 NOTE — Telephone Encounter (Signed)
Email sent to [facility] and Dr. Sabra Heck for direction for in house concern.

## 2021-02-14 NOTE — Telephone Encounter (Signed)
Received refill Request from [facility]: 01/15/21 Contract on File.  Pended Rx and sent to [facility] for approval due to Dr. Sabra Heck out of office.

## 2021-02-14 NOTE — Telephone Encounter (Signed)
I will refill this once for  15 Tablets since she is on multiple other medication and this is higher than recommend dose for female. Would recommend that she decrease to 5 mg at bedtime is this is recommended for age greater than 66 and all females regardless of age. She is also [place] this medication out of state. All other controlled medication should be renewed by her PCP and coordinated to when he is in office.

## 2021-02-19 DIAGNOSIS — I739 Peripheral vascular disease, unspecified: Secondary | ICD-10-CM | POA: Diagnosis not present

## 2021-02-19 DIAGNOSIS — I429 Cardiomyopathy, unspecified: Secondary | ICD-10-CM | POA: Diagnosis not present

## 2021-02-19 DIAGNOSIS — E876 Hypokalemia: Secondary | ICD-10-CM | POA: Diagnosis not present

## 2021-02-19 DIAGNOSIS — I70209 Unspecified atherosclerosis of native arteries of extremities, unspecified extremity: Secondary | ICD-10-CM | POA: Diagnosis not present

## 2021-02-24 DIAGNOSIS — R945 Abnormal results of liver function studies: Secondary | ICD-10-CM | POA: Diagnosis not present

## 2021-02-24 DIAGNOSIS — R748 Abnormal levels of other serum enzymes: Secondary | ICD-10-CM | POA: Diagnosis not present

## 2021-02-24 DIAGNOSIS — Z720 Tobacco use: Secondary | ICD-10-CM | POA: Diagnosis not present

## 2021-02-24 DIAGNOSIS — Z635 Disruption of family by separation and divorce: Secondary | ICD-10-CM | POA: Diagnosis not present

## 2021-02-24 DIAGNOSIS — Z886 Allergy status to analgesic agent status: Secondary | ICD-10-CM | POA: Diagnosis not present

## 2021-02-24 DIAGNOSIS — I429 Cardiomyopathy, unspecified: Secondary | ICD-10-CM | POA: Diagnosis not present

## 2021-02-24 DIAGNOSIS — Z7982 Long term (current) use of aspirin: Secondary | ICD-10-CM | POA: Diagnosis not present

## 2021-02-24 DIAGNOSIS — R55 Syncope and collapse: Secondary | ICD-10-CM | POA: Diagnosis not present

## 2021-02-24 DIAGNOSIS — S0993XA Unspecified injury of face, initial encounter: Secondary | ICD-10-CM | POA: Diagnosis not present

## 2021-02-24 DIAGNOSIS — E876 Hypokalemia: Secondary | ICD-10-CM | POA: Diagnosis not present

## 2021-02-24 DIAGNOSIS — S199XXA Unspecified injury of neck, initial encounter: Secondary | ICD-10-CM | POA: Diagnosis not present

## 2021-02-24 DIAGNOSIS — Z79891 Long term (current) use of opiate analgesic: Secondary | ICD-10-CM | POA: Diagnosis not present

## 2021-02-24 DIAGNOSIS — Y92002 Bathroom of unspecified non-institutional (private) residence single-family (private) house as the place of occurrence of the external cause: Secondary | ICD-10-CM | POA: Diagnosis not present

## 2021-02-24 DIAGNOSIS — R220 Localized swelling, mass and lump, head: Secondary | ICD-10-CM | POA: Diagnosis not present

## 2021-02-24 DIAGNOSIS — I739 Peripheral vascular disease, unspecified: Secondary | ICD-10-CM | POA: Diagnosis not present

## 2021-02-24 DIAGNOSIS — I10 Essential (primary) hypertension: Secondary | ICD-10-CM | POA: Diagnosis not present

## 2021-02-24 DIAGNOSIS — I5181 Takotsubo syndrome: Secondary | ICD-10-CM | POA: Diagnosis not present

## 2021-02-24 DIAGNOSIS — Z79899 Other long term (current) drug therapy: Secondary | ICD-10-CM | POA: Diagnosis not present

## 2021-02-24 DIAGNOSIS — F419 Anxiety disorder, unspecified: Secondary | ICD-10-CM | POA: Diagnosis not present

## 2021-02-24 DIAGNOSIS — Z90710 Acquired absence of both cervix and uterus: Secondary | ICD-10-CM | POA: Diagnosis not present

## 2021-02-24 DIAGNOSIS — I160 Hypertensive urgency: Secondary | ICD-10-CM | POA: Diagnosis not present

## 2021-02-24 DIAGNOSIS — K219 Gastro-esophageal reflux disease without esophagitis: Secondary | ICD-10-CM | POA: Diagnosis not present

## 2021-02-24 DIAGNOSIS — Z881 Allergy status to other antibiotic agents status: Secondary | ICD-10-CM | POA: Diagnosis not present

## 2021-02-24 DIAGNOSIS — Z825 Family history of asthma and other chronic lower respiratory diseases: Secondary | ICD-10-CM | POA: Diagnosis not present

## 2021-02-24 DIAGNOSIS — W1839XA Other fall on same level, initial encounter: Secondary | ICD-10-CM | POA: Diagnosis not present

## 2021-02-24 DIAGNOSIS — Z043 Encounter for examination and observation following other accident: Secondary | ICD-10-CM | POA: Diagnosis not present

## 2021-02-24 DIAGNOSIS — Z888 Allergy status to other drugs, medicaments and biological substances status: Secondary | ICD-10-CM | POA: Diagnosis not present

## 2021-02-24 DIAGNOSIS — Z885 Allergy status to narcotic agent status: Secondary | ICD-10-CM | POA: Diagnosis not present

## 2021-02-24 DIAGNOSIS — R7989 Other specified abnormal findings of blood chemistry: Secondary | ICD-10-CM | POA: Diagnosis not present

## 2021-02-24 DIAGNOSIS — S0003XA Contusion of scalp, initial encounter: Secondary | ICD-10-CM | POA: Diagnosis not present

## 2021-02-24 DIAGNOSIS — Z9049 Acquired absence of other specified parts of digestive tract: Secondary | ICD-10-CM | POA: Diagnosis not present

## 2021-02-24 DIAGNOSIS — Z8249 Family history of ischemic heart disease and other diseases of the circulatory system: Secondary | ICD-10-CM | POA: Diagnosis not present

## 2021-02-24 DIAGNOSIS — S0083XA Contusion of other part of head, initial encounter: Secondary | ICD-10-CM | POA: Diagnosis not present

## 2021-02-24 DIAGNOSIS — F17210 Nicotine dependence, cigarettes, uncomplicated: Secondary | ICD-10-CM | POA: Diagnosis not present

## 2021-02-24 DIAGNOSIS — R7401 Elevation of levels of liver transaminase levels: Secondary | ICD-10-CM | POA: Diagnosis not present

## 2021-02-24 DIAGNOSIS — S0990XA Unspecified injury of head, initial encounter: Secondary | ICD-10-CM | POA: Diagnosis not present

## 2021-02-28 ENCOUNTER — Telehealth: Payer: Self-pay | Admitting: *Deleted

## 2021-02-28 NOTE — Telephone Encounter (Signed)
Patient called and stated that she had to go to hospital due to fall and hitting her head on sink. Stated that it was due to a Syncope episode. She is going to have 2 stents placed in her groin in August. They told her she HAD to quit smoking or she could possibly lose a leg, so she is going to try some Nicotine patches.   Stated that her blood pressure was high in the hospital and they increased her Lisinopril to 40 mg.   Patient is wanting to know if you would prescribe PLAIN Oxycodone 20mg  instead of 10mg .   Please Advise.

## 2021-02-28 NOTE — Telephone Encounter (Signed)
[name], MD  You 34 minutes ago (9:42 AM)   Seems like we just refilled that; cannot give her more this soon. I think her insurance company also denied the higher dose. Do you see that communication?       Yes, Insurance denied the Oxycodone 20mg  12 hours.  And you last refilled Oxycodone 10mg  on 02/12/2021.

## 2021-03-04 ENCOUNTER — Other Ambulatory Visit: Payer: Self-pay | Admitting: *Deleted

## 2021-03-04 DIAGNOSIS — F411 Generalized anxiety disorder: Secondary | ICD-10-CM

## 2021-03-04 MED ORDER — LORAZEPAM 0.5 MG PO TABS: ORAL_TABLET | ORAL | 0 refills | Status: DC

## 2021-03-04 NOTE — Telephone Encounter (Signed)
Pharmacy requested refill Epic LR: 12/26/2020 Pended Rx and sent to Dr. Sabra Heck for approval.

## 2021-03-04 NOTE — Telephone Encounter (Signed)
Patient aware we cannot increase at this time and cannot refill until 30 days from last refill date.

## 2021-03-10 DIAGNOSIS — I10 Essential (primary) hypertension: Secondary | ICD-10-CM | POA: Diagnosis not present

## 2021-03-10 DIAGNOSIS — Z7982 Long term (current) use of aspirin: Secondary | ICD-10-CM | POA: Diagnosis not present

## 2021-03-10 DIAGNOSIS — F419 Anxiety disorder, unspecified: Secondary | ICD-10-CM | POA: Diagnosis not present

## 2021-03-10 DIAGNOSIS — F17200 Nicotine dependence, unspecified, uncomplicated: Secondary | ICD-10-CM | POA: Diagnosis not present

## 2021-03-10 DIAGNOSIS — W1839XD Other fall on same level, subsequent encounter: Secondary | ICD-10-CM | POA: Diagnosis not present

## 2021-03-10 DIAGNOSIS — Z79899 Other long term (current) drug therapy: Secondary | ICD-10-CM | POA: Diagnosis not present

## 2021-03-10 DIAGNOSIS — G8929 Other chronic pain: Secondary | ICD-10-CM | POA: Diagnosis not present

## 2021-03-10 DIAGNOSIS — K219 Gastro-esophageal reflux disease without esophagitis: Secondary | ICD-10-CM | POA: Diagnosis not present

## 2021-03-10 DIAGNOSIS — R519 Headache, unspecified: Secondary | ICD-10-CM | POA: Diagnosis not present

## 2021-03-10 DIAGNOSIS — F17210 Nicotine dependence, cigarettes, uncomplicated: Secondary | ICD-10-CM | POA: Diagnosis not present

## 2021-03-10 DIAGNOSIS — S0003XA Contusion of scalp, initial encounter: Secondary | ICD-10-CM | POA: Diagnosis not present

## 2021-03-19 ENCOUNTER — Other Ambulatory Visit: Payer: Self-pay | Admitting: Nurse Practitioner

## 2021-03-19 DIAGNOSIS — I10 Essential (primary) hypertension: Secondary | ICD-10-CM | POA: Diagnosis not present

## 2021-03-19 DIAGNOSIS — F5101 Primary insomnia: Secondary | ICD-10-CM

## 2021-03-19 NOTE — Telephone Encounter (Signed)
Pharmacy requested refill.  [facility]: 02/14/2021 Contract on [employer] Rx and sent to Dr. Sabra Heck for approval.

## 2021-03-21 ENCOUNTER — Other Ambulatory Visit: Payer: Self-pay | Admitting: *Deleted

## 2021-03-21 MED ORDER — SIMVASTATIN 20 MG PO TABS: 20.0000 mg | ORAL_TABLET | Freq: Every day | ORAL | 1 refills | Status: DC

## 2021-03-21 NOTE — Telephone Encounter (Signed)
Pharmacy requested refill

## 2021-03-24 DIAGNOSIS — S80211A Abrasion, right knee, initial encounter: Secondary | ICD-10-CM | POA: Diagnosis not present

## 2021-03-24 DIAGNOSIS — Z87828 Personal history of other (healed) physical injury and trauma: Secondary | ICD-10-CM | POA: Diagnosis not present

## 2021-03-24 DIAGNOSIS — Z743 Need for continuous supervision: Secondary | ICD-10-CM | POA: Diagnosis not present

## 2021-03-24 DIAGNOSIS — R296 Repeated falls: Secondary | ICD-10-CM | POA: Diagnosis not present

## 2021-03-24 DIAGNOSIS — I10 Essential (primary) hypertension: Secondary | ICD-10-CM | POA: Diagnosis not present

## 2021-03-24 DIAGNOSIS — S0083XA Contusion of other part of head, initial encounter: Secondary | ICD-10-CM | POA: Diagnosis not present

## 2021-03-24 DIAGNOSIS — W19XXXA Unspecified fall, initial encounter: Secondary | ICD-10-CM | POA: Diagnosis not present

## 2021-03-24 DIAGNOSIS — R4589 Other symptoms and signs involving emotional state: Secondary | ICD-10-CM | POA: Diagnosis not present

## 2021-03-24 DIAGNOSIS — Z79899 Other long term (current) drug therapy: Secondary | ICD-10-CM | POA: Diagnosis not present

## 2021-03-24 DIAGNOSIS — F17200 Nicotine dependence, unspecified, uncomplicated: Secondary | ICD-10-CM | POA: Diagnosis not present

## 2021-03-24 DIAGNOSIS — S0990XA Unspecified injury of head, initial encounter: Secondary | ICD-10-CM | POA: Diagnosis not present

## 2021-03-24 DIAGNOSIS — Z9181 History of falling: Secondary | ICD-10-CM | POA: Diagnosis not present

## 2021-03-24 DIAGNOSIS — Z20822 Contact with and (suspected) exposure to covid-19: Secondary | ICD-10-CM | POA: Diagnosis not present

## 2021-03-24 DIAGNOSIS — R55 Syncope and collapse: Secondary | ICD-10-CM | POA: Diagnosis not present

## 2021-03-24 DIAGNOSIS — R443 Hallucinations, unspecified: Secondary | ICD-10-CM | POA: Diagnosis not present

## 2021-04-01 DIAGNOSIS — I429 Cardiomyopathy, unspecified: Secondary | ICD-10-CM | POA: Diagnosis not present

## 2021-04-01 DIAGNOSIS — E876 Hypokalemia: Secondary | ICD-10-CM | POA: Diagnosis not present

## 2021-04-01 DIAGNOSIS — I739 Peripheral vascular disease, unspecified: Secondary | ICD-10-CM | POA: Diagnosis not present

## 2021-04-01 DIAGNOSIS — I70209 Unspecified atherosclerosis of native arteries of extremities, unspecified extremity: Secondary | ICD-10-CM | POA: Diagnosis not present

## 2021-04-04 ENCOUNTER — Other Ambulatory Visit: Payer: Self-pay | Admitting: *Deleted

## 2021-04-04 DIAGNOSIS — F411 Generalized anxiety disorder: Secondary | ICD-10-CM

## 2021-04-04 NOTE — Telephone Encounter (Signed)
Patient requested refill.  E[facility] LR: 03/04/2021 Pended Rx and sent to Dr. Sabra Heck for approval.

## 2021-04-07 MED ORDER — LORAZEPAM 0.5 MG PO TABS: ORAL_TABLET | ORAL | 0 refills | Status: DC

## 2021-04-11 DIAGNOSIS — E876 Hypokalemia: Secondary | ICD-10-CM | POA: Diagnosis not present

## 2021-04-11 DIAGNOSIS — I708 Atherosclerosis of other arteries: Secondary | ICD-10-CM | POA: Diagnosis not present

## 2021-04-11 DIAGNOSIS — I70212 Atherosclerosis of native arteries of extremities with intermittent claudication, left leg: Secondary | ICD-10-CM | POA: Diagnosis not present

## 2021-04-11 DIAGNOSIS — I739 Peripheral vascular disease, unspecified: Secondary | ICD-10-CM | POA: Diagnosis not present

## 2021-04-18 DIAGNOSIS — S0990XA Unspecified injury of head, initial encounter: Secondary | ICD-10-CM | POA: Diagnosis not present

## 2021-04-18 DIAGNOSIS — S199XXA Unspecified injury of neck, initial encounter: Secondary | ICD-10-CM | POA: Diagnosis not present

## 2021-04-22 ENCOUNTER — Other Ambulatory Visit: Payer: Self-pay

## 2021-04-22 DIAGNOSIS — F5101 Primary insomnia: Secondary | ICD-10-CM

## 2021-04-22 NOTE — Telephone Encounter (Signed)
Refill request received from pharmacy for Zolpidem 10 mg tablet. Medication pended and sent to Dr. Alain Honey for approval.

## 2021-04-22 NOTE — Telephone Encounter (Signed)
Spoke with Dr. Sabra Heck regarding this refill request for patient and he chose not to refill medication due to patient's recent hospitalization.

## 2021-04-23 ENCOUNTER — Other Ambulatory Visit: Payer: Self-pay | Admitting: *Deleted

## 2021-04-23 DIAGNOSIS — F5101 Primary insomnia: Secondary | ICD-10-CM

## 2021-04-23 MED ORDER — ZOLPIDEM TARTRATE 10 MG PO TABS: ORAL_TABLET | ORAL | 0 refills | Status: DC

## 2021-04-23 NOTE — Telephone Encounter (Signed)
Received refill Request from pharmacy Pended Rx and sent to Dr. Sabra Heck for approval.

## 2021-04-30 ENCOUNTER — Ambulatory Visit: Payer: Medicare Other | Admitting: Family Medicine

## 2021-05-04 DIAGNOSIS — S92241A Displaced fracture of medial cuneiform of right foot, initial encounter for closed fracture: Secondary | ICD-10-CM | POA: Diagnosis not present

## 2021-05-04 DIAGNOSIS — F17210 Nicotine dependence, cigarettes, uncomplicated: Secondary | ICD-10-CM | POA: Diagnosis present

## 2021-05-04 DIAGNOSIS — I739 Peripheral vascular disease, unspecified: Secondary | ICD-10-CM | POA: Diagnosis present

## 2021-05-04 DIAGNOSIS — M79673 Pain in unspecified foot: Secondary | ICD-10-CM | POA: Diagnosis not present

## 2021-05-04 DIAGNOSIS — S92344A Nondisplaced fracture of fourth metatarsal bone, right foot, initial encounter for closed fracture: Secondary | ICD-10-CM | POA: Diagnosis present

## 2021-05-04 DIAGNOSIS — M25571 Pain in right ankle and joints of right foot: Secondary | ICD-10-CM | POA: Diagnosis not present

## 2021-05-04 DIAGNOSIS — E876 Hypokalemia: Secondary | ICD-10-CM | POA: Diagnosis present

## 2021-05-04 DIAGNOSIS — S92231A Displaced fracture of intermediate cuneiform of right foot, initial encounter for closed fracture: Secondary | ICD-10-CM | POA: Diagnosis not present

## 2021-05-04 DIAGNOSIS — S92214A Nondisplaced fracture of cuboid bone of right foot, initial encounter for closed fracture: Secondary | ICD-10-CM | POA: Diagnosis present

## 2021-05-04 DIAGNOSIS — I10 Essential (primary) hypertension: Secondary | ICD-10-CM | POA: Diagnosis present

## 2021-05-04 DIAGNOSIS — G894 Chronic pain syndrome: Secondary | ICD-10-CM | POA: Diagnosis present

## 2021-05-04 DIAGNOSIS — I2510 Atherosclerotic heart disease of native coronary artery without angina pectoris: Secondary | ICD-10-CM | POA: Diagnosis present

## 2021-05-04 DIAGNOSIS — L03115 Cellulitis of right lower limb: Secondary | ICD-10-CM | POA: Diagnosis not present

## 2021-05-04 DIAGNOSIS — Z7982 Long term (current) use of aspirin: Secondary | ICD-10-CM | POA: Diagnosis not present

## 2021-05-04 DIAGNOSIS — I160 Hypertensive urgency: Secondary | ICD-10-CM | POA: Diagnosis present

## 2021-05-04 DIAGNOSIS — S92324A Nondisplaced fracture of second metatarsal bone, right foot, initial encounter for closed fracture: Secondary | ICD-10-CM | POA: Diagnosis present

## 2021-05-04 DIAGNOSIS — Z95820 Peripheral vascular angioplasty status with implants and grafts: Secondary | ICD-10-CM | POA: Diagnosis not present

## 2021-05-04 DIAGNOSIS — S92211A Displaced fracture of cuboid bone of right foot, initial encounter for closed fracture: Secondary | ICD-10-CM | POA: Diagnosis not present

## 2021-05-04 DIAGNOSIS — Z79899 Other long term (current) drug therapy: Secondary | ICD-10-CM | POA: Diagnosis not present

## 2021-05-04 DIAGNOSIS — R269 Unspecified abnormalities of gait and mobility: Secondary | ICD-10-CM | POA: Diagnosis present

## 2021-05-04 DIAGNOSIS — S92244A Nondisplaced fracture of medial cuneiform of right foot, initial encounter for closed fracture: Secondary | ICD-10-CM | POA: Diagnosis present

## 2021-05-04 DIAGNOSIS — Z7902 Long term (current) use of antithrombotics/antiplatelets: Secondary | ICD-10-CM | POA: Diagnosis not present

## 2021-05-04 DIAGNOSIS — R609 Edema, unspecified: Secondary | ICD-10-CM | POA: Diagnosis not present

## 2021-05-04 DIAGNOSIS — Z7901 Long term (current) use of anticoagulants: Secondary | ICD-10-CM | POA: Diagnosis not present

## 2021-05-04 DIAGNOSIS — M79671 Pain in right foot: Secondary | ICD-10-CM | POA: Diagnosis not present

## 2021-05-04 DIAGNOSIS — Z90710 Acquired absence of both cervix and uterus: Secondary | ICD-10-CM | POA: Diagnosis not present

## 2021-05-04 DIAGNOSIS — Z720 Tobacco use: Secondary | ICD-10-CM | POA: Diagnosis not present

## 2021-05-04 DIAGNOSIS — E785 Hyperlipidemia, unspecified: Secondary | ICD-10-CM | POA: Diagnosis present

## 2021-05-05 DIAGNOSIS — G894 Chronic pain syndrome: Secondary | ICD-10-CM | POA: Diagnosis present

## 2021-05-05 DIAGNOSIS — Z90710 Acquired absence of both cervix and uterus: Secondary | ICD-10-CM | POA: Diagnosis not present

## 2021-05-05 DIAGNOSIS — I739 Peripheral vascular disease, unspecified: Secondary | ICD-10-CM | POA: Diagnosis present

## 2021-05-05 DIAGNOSIS — R269 Unspecified abnormalities of gait and mobility: Secondary | ICD-10-CM | POA: Diagnosis present

## 2021-05-05 DIAGNOSIS — I10 Essential (primary) hypertension: Secondary | ICD-10-CM | POA: Diagnosis present

## 2021-05-05 DIAGNOSIS — M79671 Pain in right foot: Secondary | ICD-10-CM | POA: Diagnosis not present

## 2021-05-05 DIAGNOSIS — S92214A Nondisplaced fracture of cuboid bone of right foot, initial encounter for closed fracture: Secondary | ICD-10-CM | POA: Diagnosis present

## 2021-05-05 DIAGNOSIS — Z7902 Long term (current) use of antithrombotics/antiplatelets: Secondary | ICD-10-CM | POA: Diagnosis not present

## 2021-05-05 DIAGNOSIS — I2510 Atherosclerotic heart disease of native coronary artery without angina pectoris: Secondary | ICD-10-CM | POA: Diagnosis present

## 2021-05-05 DIAGNOSIS — Z95820 Peripheral vascular angioplasty status with implants and grafts: Secondary | ICD-10-CM | POA: Diagnosis not present

## 2021-05-05 DIAGNOSIS — S92324A Nondisplaced fracture of second metatarsal bone, right foot, initial encounter for closed fracture: Secondary | ICD-10-CM | POA: Diagnosis present

## 2021-05-05 DIAGNOSIS — S92244A Nondisplaced fracture of medial cuneiform of right foot, initial encounter for closed fracture: Secondary | ICD-10-CM | POA: Diagnosis present

## 2021-05-05 DIAGNOSIS — F17210 Nicotine dependence, cigarettes, uncomplicated: Secondary | ICD-10-CM | POA: Diagnosis present

## 2021-05-05 DIAGNOSIS — E876 Hypokalemia: Secondary | ICD-10-CM | POA: Diagnosis present

## 2021-05-05 DIAGNOSIS — E785 Hyperlipidemia, unspecified: Secondary | ICD-10-CM | POA: Diagnosis present

## 2021-05-05 DIAGNOSIS — S92344A Nondisplaced fracture of fourth metatarsal bone, right foot, initial encounter for closed fracture: Secondary | ICD-10-CM | POA: Diagnosis present

## 2021-05-05 DIAGNOSIS — Z79899 Other long term (current) drug therapy: Secondary | ICD-10-CM | POA: Diagnosis not present

## 2021-05-05 DIAGNOSIS — I160 Hypertensive urgency: Secondary | ICD-10-CM | POA: Diagnosis present

## 2021-05-05 DIAGNOSIS — Z7982 Long term (current) use of aspirin: Secondary | ICD-10-CM | POA: Diagnosis not present

## 2021-05-07 ENCOUNTER — Ambulatory Visit: Payer: Medicare Other | Admitting: Family Medicine

## 2021-05-08 ENCOUNTER — Other Ambulatory Visit: Payer: Self-pay | Admitting: *Deleted

## 2021-05-08 DIAGNOSIS — R1011 Right upper quadrant pain: Secondary | ICD-10-CM

## 2021-05-08 DIAGNOSIS — G8929 Other chronic pain: Secondary | ICD-10-CM

## 2021-05-08 NOTE — Telephone Encounter (Signed)
Patient called and stated that she is back home now in [place]. Stated that she has Fractured 3 bones in her foot and in a brace. FYI  Patient called to schedule follow up appointment and request refill.  [facility]: 02/12/2021 Contract on [employer] Rx and sent to Dr. Sabra Heck for approval.

## 2021-05-10 ENCOUNTER — Encounter (HOSPITAL_COMMUNITY): Payer: Self-pay

## 2021-05-10 ENCOUNTER — Emergency Department (HOSPITAL_COMMUNITY)
Admission: EM | Admit: 2021-05-10 | Discharge: 2021-05-10 | Disposition: A | Discharge status: Home / Self Care | Payer: Medicare Other | Attending: Emergency Medicine | Admitting: Emergency Medicine

## 2021-05-10 DIAGNOSIS — Z7982 Long term (current) use of aspirin: Secondary | ICD-10-CM | POA: Insufficient documentation

## 2021-05-10 DIAGNOSIS — M25571 Pain in right ankle and joints of right foot: Secondary | ICD-10-CM | POA: Diagnosis not present

## 2021-05-10 DIAGNOSIS — Z79899 Other long term (current) drug therapy: Secondary | ICD-10-CM | POA: Insufficient documentation

## 2021-05-10 DIAGNOSIS — F17210 Nicotine dependence, cigarettes, uncomplicated: Secondary | ICD-10-CM | POA: Diagnosis not present

## 2021-05-10 DIAGNOSIS — S92901A Unspecified fracture of right foot, initial encounter for closed fracture: Secondary | ICD-10-CM

## 2021-05-10 DIAGNOSIS — X58XXXA Exposure to other specified factors, initial encounter: Secondary | ICD-10-CM | POA: Diagnosis not present

## 2021-05-10 DIAGNOSIS — S99921A Unspecified injury of right foot, initial encounter: Secondary | ICD-10-CM | POA: Diagnosis present

## 2021-05-10 DIAGNOSIS — I10 Essential (primary) hypertension: Secondary | ICD-10-CM | POA: Diagnosis not present

## 2021-05-10 DIAGNOSIS — S92241A Displaced fracture of medial cuneiform of right foot, initial encounter for closed fracture: Secondary | ICD-10-CM | POA: Diagnosis not present

## 2021-05-10 MED ORDER — HYDROCODONE-ACETAMINOPHEN 5-325 MG PO TABS
1.0000 | ORAL_TABLET | Freq: Once | ORAL | Status: AC
  Administered 2021-05-10: 1 via ORAL
  Filled 2021-05-10: qty 1

## 2021-05-10 NOTE — Progress Notes (Signed)
Rolling walker ordered for patient through adapt at 1010

## 2021-05-10 NOTE — Progress Notes (Signed)
..  Transition of Care [facility]) - Emergency Department Mini Assessment   Patient Details  Name: Theresa Carey MRN: [id number] Date of Birth: 11-18-1955  Transition of Care [facility]) CM/SW Contact:    Illene Regulus, LCSW Phone Number: 05/10/2021, 9:15 AM   Clinical Narrative:  CSW spoke with pt, she stated she was release from the hospital 3 days ago and never received DME or [place] services. Pt stated she requested a rolling walker and bedside commode. Pt stated she was using her mother's walker, which she stated is too old. CSW to contact ADPT to order DME.   Arlie Solomons.Johnathon Mittal, MSW, [place]  Transitions of Care Clinical Social Worker I Direct Dial: [phone]  Fax: [phone] [email or web address]   ED Mini Assessment: What brought you to the Emergency Department? : [place] Nurse  Barriers to Discharge: No Barriers Identified        Interventions which prevented an admission or readmission: DME Provided, Home Health Consult or Services    Patient Contact and Communications        ,                 Admission diagnosis:  Ankle Pain Patient Active Problem List   Diagnosis Date Noted   Alcohol abuse 10/20/2019   Neuropathic pain, leg, right 10/20/2019   Nausea 10/05/2019   Occlusion of common femoral artery ([place]) 10/05/2019   Cardiomyopathy ([place]) 10/04/2019   Anxiety 09/28/2019   Chronic pain 09/28/2019   Leukocytosis 09/28/2019   Osteopenia of multiple sites 11/08/2018   GERD (gastroesophageal reflux disease) 04/29/2018   History of colon polyps 04/29/2018   Ganglion cyst of wrist, left 08/20/2017   Cyst, dermoid, arm, left 08/20/2017   Elevated transaminase level 03/03/2014   Intractable nausea and vomiting 08/14/2012   Hypokalemia 08/14/2012   Dehydration 08/14/2012   Chronic RUQ pain 08/14/2012   Tobacco abuse 08/14/2012   PCP:  [name], MD Pharmacy:   [id number] [place], [place] - [address] [place] [address] Phone: [phone] Fax: [place] NR:[id number] [place], [place] - [place] [address] Phone: [phone] Fax: [place] [address], [place] [address] [place] [address] Phone: [phone] Fax: [place], [place] - [place] AT [facility] [place] [address] Phone: [phone] Fax: [phone]

## 2021-05-10 NOTE — ED Triage Notes (Signed)
Pt BIB [facility] from home with right foot pain. Pt was placed in a boot last Sunday. Pt is still experiencing pain in her foot and would like to be evaluated. Pt states she fell the first night she came home but has been in the bed ever seince. Pt states she took '5mg'$  Oxy last night at 2000. Pt rates pain 8/10.

## 2021-05-10 NOTE — Discharge Instructions (Addendum)
Continue to use the boot.  Use your walker for support.  Continue your current medications. Schedule an appointment with an orthopedic doctor for further evaluation.

## 2021-05-10 NOTE — ED Provider Notes (Signed)
[facility] DEPT Provider Note   CSN: [id number] Arrival date & time: 05/10/21  0225     History Chief Complaint  Patient presents with   Foot Pain    Theresa Carey is a 65 y.o. female.   Foot Pain Pertinent negatives include no chest pain and no shortness of breath.   Patient states she had a fall last Sunday.  She continues to have pain in her right foot.  Patient states she was never told the results after being evaluated another facility.  Patient states she had x-rays and ultrasound.  Patient was given a cam walker boot.  Patient is not currently wearing it and states it was difficult to put it back on.  She also has been using her mother's walker but was told that home health was going to come and get her a different walker because she does not think the current walker is appropriate for her..  Patient states she has not been able to call an orthopedic doctor to schedule follow-up.  Past Medical History:  Diagnosis Date   Abdominal pain 2000   chronic abdominal pain with nausea and vomiting   Abnormal liver function    Allergy    Hypertension     Patient Active Problem List   Diagnosis Date Noted   Alcohol abuse 10/20/2019   Neuropathic pain, leg, right 10/20/2019   Nausea 10/05/2019   Occlusion of common femoral artery ([place]) 10/05/2019   Cardiomyopathy ([place]) 10/04/2019   Anxiety 09/28/2019   Chronic pain 09/28/2019   Leukocytosis 09/28/2019   Osteopenia of multiple sites 11/08/2018   GERD (gastroesophageal reflux disease) 04/29/2018   History of colon polyps 04/29/2018   Ganglion cyst of wrist, left 08/20/2017   Cyst, dermoid, arm, left 08/20/2017   Elevated transaminase level 03/03/2014   Intractable nausea and vomiting 08/14/2012   Hypokalemia 08/14/2012   Dehydration 08/14/2012   Chronic RUQ pain 08/14/2012   Tobacco abuse 08/14/2012    Past Surgical History:  Procedure Laterality Date   ABDOMINAL HYSTERECTOMY      CARDIAC CATHETERIZATION  10/11/2019   CHOLECYSTECTOMY       OB History   No obstetric history on file.     Family History  Problem Relation Age of Onset   Hypertension Father    Cancer - Other Father        Bladder   Dementia Father    Hypertension Mother    Parkinson's disease Mother    Diabetes Maternal Grandmother    Cancer - Other Maternal Grandmother        Pancreatic   Cancer - Other Paternal Grandmother        Bone    Social History   Tobacco Use   Smoking status: Every Day    Packs/day: 0.50    Types: Cigarettes   Smokeless tobacco: Never  Vaping Use   Vaping Use: Never used  Substance Use Topics   Alcohol use: Yes    Comment: socially    Drug use: No    Home Medications Prior to Admission medications   Medication Sig Start Date End Date Taking? Authorizing Provider  aspirin 81 MG chewable tablet Chew 81 mg by mouth daily.     [provider]  Biotin 1 MG CAPS Take 1 mg by mouth daily at 6 (six) AM.    [provider]  Cholecalciferol (VITAMIN D) 2000 UNITS CAPS Take 1 capsule (2,000 Units total) by mouth daily. 08/31/14   Hollace Kinnier  L, DO  escitalopram (LEXAPRO) 10 MG tablet Take 1 tablet (10 mg total) by mouth daily. 01/01/21   [name], MD  famotidine (PEPCID) 40 MG tablet take 1 tablet by mouth once daily 04/02/20   Ngetich, Dinah C, NP  gabapentin (NEURONTIN) 100 MG capsule take 1 capsule by mouth three times a day 01/01/21   [name], MD  lisinopril (ZESTRIL) 20 MG tablet Take 1 tablet (20 mg total) by mouth daily. 07/30/20   Reed, Tiffany L, DO  LORazepam (ATIVAN) 0.5 MG tablet take 1 tablet by mouth every 6 hours if needed 04/07/21   [name], MD  metoprolol succinate (TOPROL-XL) 25 MG 24 hr tablet Take 1 tablet (25 mg total) by mouth 2 (two) times daily. Morning and Night. 07/30/20   Reed, Tiffany L, DO  Oxycodone HCl 10 MG TABS take 1 tablet by mouth every 6 hours if needed for pain 02/12/21   [name], MD  pantoprazole (PROTONIX) 40 MG tablet take 1 tablet by mouth twice a day 08/03/20   Reed, Tiffany L, DO  simvastatin (ZOCOR) 20 MG tablet Take 1 tablet (20 mg total) by mouth at bedtime. 03/21/21   [name], MD  valACYclovir (VALTREX) 500 MG tablet take 1 tablet by mouth twice a day 08/03/20   Hollace Kinnier L, DO  zolpidem (AMBIEN) 10 MG tablet take 1 tablet by mouth at bedtime for sleep 04/23/21   [name], MD    Allergies    Avelox [moxifloxacin hcl in nacl], Compazine [prochlorperazine edisylate], Ibuprofen, Morphine and related, and Promethazine hcl  Review of Systems   Review of Systems  Constitutional:  Negative for fever.  Respiratory:  Negative for shortness of breath.   Cardiovascular:  Negative for chest pain.   Physical Exam Updated Vital Signs BP 104/73   Pulse 65   Temp 98.4 F (36.9 C)   Resp 16   Ht 1.651 m ('5\' 5"'$ )   Wt 56.7 kg   SpO2 100%   BMI 20.80 kg/m   Physical Exam Vitals and nursing note reviewed.  Constitutional:      General: She is not in acute distress.    Appearance: She is well-developed.  HENT:     Head: Normocephalic and atraumatic.     Right Ear: External ear normal.     Left Ear: External ear normal.  Eyes:     General: No scleral icterus.       Right eye: No discharge.        Left eye: No discharge.     Conjunctiva/sclera: Conjunctivae normal.  Neck:     Trachea: No tracheal deviation.  Cardiovascular:     Rate and Rhythm: Normal rate.  Pulmonary:     Effort: Pulmonary effort is normal. No respiratory distress.     Breath sounds: No stridor.  Musculoskeletal:        General: Swelling and deformity present.     Cervical back: Neck supple.     Comments: Ecchymosis and edema of the right foot, tenderness palpation  Skin:    General: Skin is warm and dry.     Findings: No rash.  Neurological:     Mental Status: She is alert.     Cranial Nerves: Cranial nerve deficit: no gross deficits.    ED Results /  Procedures / Treatments   Labs (all labs ordered are listed, but only abnormal results are displayed) Labs Reviewed - No data to display  EKG None  Radiology No results found.  Procedures Procedures   Medications Ordered in ED Medications  HYDROcodone-acetaminophen (NORCO/VICODIN) 5-325 MG per tablet 1 tablet (has no administration in time range)    ED Course  I have reviewed the triage vital signs and the nursing notes.  Pertinent labs & imaging results that were available during my care of the patient were reviewed by me and considered in my medical decision making (see chart for details).  Clinical Course as of 05/10/21 0830  Fri May 10, 2021  [id number] CT scan on 9/11 at another institution.  IMPRESSION:  1. Acute fractures of the cuneiforms, cuboid, and second and fourth  metatarsal bases as described above.  2. Maintained Lisfranc alignment on this nonweightbearing study.  Correlate with physical exam.   [JK]    Clinical Course User Index [JK] Dorie Rank, MD   MDM Rules/Calculators/A&P                           Outside records reviewed.  Patient was evaluated at [facility] and had CT scan of her foot on September 11.  It did demonstrate acute fractures of the cuneiforms, cuboid and metatarsal bones.Patient was admitted to the hospital on September 10 for evaluation of her acute right foot pain.  Patient was also evaluated by orthopedics according to the records.  Records also indicate that they followed up with her on September 14.  I explained to the patient the results of her CT scan at the other medical facility.  She does have fractures in her foot that would account for her pain.  She should continue to use the cam walker boot.  I will ask case management to help with an order for a walker to use at home.  Patient is stable for discharge and will need outpatient orthopedic follow-up.  I stressed the importance of her calling to schedule an  appointment  Final Clinical Impression(s) / ED Diagnoses Final diagnoses:  Closed fracture of right foot, initial encounter    Rx / DC Orders ED Discharge Orders     None        Dorie Rank, MD 05/10/21 0830

## 2021-05-13 MED ORDER — OXYCODONE HCL 10 MG PO TABS: ORAL_TABLET | ORAL | 0 refills | Status: DC

## 2021-05-14 ENCOUNTER — Encounter: Payer: Self-pay | Admitting: Family Medicine

## 2021-05-14 ENCOUNTER — Ambulatory Visit (INDEPENDENT_AMBULATORY_CARE_PROVIDER_SITE_OTHER): Payer: Medicare Other | Admitting: Family Medicine

## 2021-05-14 ENCOUNTER — Other Ambulatory Visit: Payer: Self-pay

## 2021-05-14 VITALS — BP 126/84 | HR 63 | Temp 96.6°F | Ht 65.0 in

## 2021-05-14 DIAGNOSIS — M792 Neuralgia and neuritis, unspecified: Secondary | ICD-10-CM

## 2021-05-14 DIAGNOSIS — G894 Chronic pain syndrome: Secondary | ICD-10-CM | POA: Diagnosis not present

## 2021-05-14 DIAGNOSIS — R1011 Right upper quadrant pain: Secondary | ICD-10-CM

## 2021-05-14 DIAGNOSIS — F1010 Alcohol abuse, uncomplicated: Secondary | ICD-10-CM

## 2021-05-14 DIAGNOSIS — G8929 Other chronic pain: Secondary | ICD-10-CM | POA: Diagnosis not present

## 2021-05-14 DIAGNOSIS — I70209 Unspecified atherosclerosis of native arteries of extremities, unspecified extremity: Secondary | ICD-10-CM | POA: Diagnosis not present

## 2021-05-14 DIAGNOSIS — Z720 Tobacco use: Secondary | ICD-10-CM

## 2021-05-14 MED ORDER — OXYCODONE HCL 10 MG PO TABS: ORAL_TABLET | ORAL | 0 refills | Status: DC

## 2021-05-14 NOTE — Progress Notes (Signed)
Provider:  Alain Honey, MD  Careteam: Patient Care Team: [name], MD as PCP - General (Family Medicine)  PLACE OF SERVICE:  [place]  Advanced Directive information    Allergies  Allergen Reactions   Avelox [Moxifloxacin Hcl In Nacl]     Low blood pressure   Compazine [Prochlorperazine Edisylate] Other (See Comments)    Caused lockjaw   Ibuprofen Hives   Morphine And Related Nausea And Vomiting   Promethazine Hcl Nausea And Vomiting    Chief Complaint  Patient presents with   Medical Management of Chronic Issues    Patient presents today for a follow-up.     HPI: Patient is a 65 y.o. female patient says she has finally moved here permanently from [place] having sold her parents house.  She has a condo and a roommate here who does not get along very well.  She has had multiple problems since her last visit here including stenting of her arteries in her legs, fall with broken foot, MI, fortunately her abdominal pain which has been chronic requiring narcotics has not been a problem of late.  Review of Systems:  Review of Systems  Constitutional:  Positive for weight loss.  Respiratory: Negative.    Cardiovascular:  Positive for claudication.  Gastrointestinal: Negative.   Genitourinary: Negative.   Musculoskeletal:  Positive for joint pain.       Several broken bones in her left foot  Neurological: Negative.   Psychiatric/Behavioral:  Positive for depression. The patient has insomnia.   All other systems reviewed and are negative.  Past Medical History:  Diagnosis Date   Abdominal pain 2000   chronic abdominal pain with nausea and vomiting   Abnormal liver function    Allergy    Hypertension    Past Surgical History:  Procedure Laterality Date   ABDOMINAL HYSTERECTOMY     CARDIAC CATHETERIZATION  10/11/2019   CHOLECYSTECTOMY     Social History:   reports that she has been smoking cigarettes. She has been smoking an average of .5 packs  per day. She has never used smokeless tobacco. She reports current alcohol use. She reports that she does not use drugs.  Family History  Problem Relation Age of Onset   Hypertension Father    Cancer - Other Father        Bladder   Dementia Father    Hypertension Mother    Parkinson's disease Mother    Diabetes Maternal Grandmother    Cancer - Other Maternal Grandmother        Pancreatic   Cancer - Other Paternal Grandmother        Bone    Medications: Patient's Medications  New Prescriptions   No medications on file  Previous Medications   ASPIRIN 81 MG CHEWABLE TABLET    Chew 81 mg by mouth daily.    BIOTIN 1 MG CAPS    Take 1 mg by mouth daily at 6 (six) AM.   CHOLECALCIFEROL (VITAMIN D) 2000 UNITS CAPS    Take 1 capsule (2,000 Units total) by mouth daily.   CLOPIDOGREL (PLAVIX) 75 MG TABLET    Take 75 mg by mouth daily.   CYANOCOBALAMIN (VITAMIN B 12 PO)    Take by mouth daily.   ESCITALOPRAM (LEXAPRO) 10 MG TABLET    Take 1 tablet (10 mg total) by mouth daily.   FAMOTIDINE (PEPCID) 40 MG TABLET    take 1 tablet by mouth once daily   FERROUS  SULFATE 325 (65 FE) MG EC TABLET    Take 325 mg by mouth.   GABAPENTIN (NEURONTIN) 100 MG CAPSULE    take 1 capsule by mouth three times a day   LISINOPRIL (ZESTRIL) 20 MG TABLET    Take 1 tablet (20 mg total) by mouth daily.   LORAZEPAM (ATIVAN) 0.5 MG TABLET    take 1 tablet by mouth every 6 hours if needed   METOPROLOL SUCCINATE (TOPROL-XL) 25 MG 24 HR TABLET    Take 1 tablet (25 mg total) by mouth 2 (two) times daily. Morning and Night.   OXYCODONE HCL 10 MG TABS    take 1 tablet by mouth every 6 hours if needed for pain   PANTOPRAZOLE (PROTONIX) 40 MG TABLET    take 1 tablet by mouth twice a day   SIMVASTATIN (ZOCOR) 20 MG TABLET    Take 1 tablet (20 mg total) by mouth at bedtime.   VALACYCLOVIR (VALTREX) 500 MG TABLET    take 1 tablet by mouth twice a day   ZOLPIDEM (AMBIEN) 10 MG TABLET    take 1 tablet by mouth at bedtime for  sleep  Modified Medications   No medications on file  Discontinued Medications   No medications on file    Physical Exam:  Vitals:   05/14/21 1325  BP: 126/84  Pulse: 63  Temp: (!) 96.6 F (35.9 C)  TempSrc: Temporal  SpO2: 97%  Height: 5\' 5"  (1.651 m)   Body mass index is 20.8 kg/m. Wt Readings from Last 3 Encounters:  05/10/21 125 lb (56.7 kg)  01/01/21 126 lb 3.2 oz (57.2 kg)  11/08/18 120 lb (54.4 kg)    Physical Exam Vitals and nursing note reviewed.  Constitutional:      Appearance: She is ill-appearing.  HENT:     Head: Normocephalic.  Cardiovascular:     Rate and Rhythm: Normal rate and regular rhythm.  Pulmonary:     Effort: Pulmonary effort is normal.     Breath sounds: Normal breath sounds.  Musculoskeletal:     Comments: Appointment with orthopedist soon to help manage her broken left foot  Neurological:     General: No focal deficit present.     Mental Status: She is alert and oriented to person, place, and time.  Psychiatric:        Mood and Affect: Mood normal.        Behavior: Behavior normal.        Thought Content: Thought content normal.        Judgment: Judgment normal.    Labs reviewed: Basic Metabolic Panel: No results for input(s): NA, K, CL, CO2, GLUCOSE, BUN, CREATININE, CALCIUM, MG, PHOS, TSH in the last 8760 hours. Liver Function Tests: No results for input(s): AST, ALT, ALKPHOS, BILITOT, PROT, ALBUMIN in the last 8760 hours. No results for input(s): LIPASE, AMYLASE in the last 8760 hours. No results for input(s): AMMONIA in the last 8760 hours. CBC: No results for input(s): WBC, NEUTROABS, HGB, HCT, MCV, PLT in the last 8760 hours. Lipid Panel: No results for input(s): CHOL, HDL, LDLCALC, TRIG, CHOLHDL, LDLDIRECT in the last 8760 hours. TSH: No results for input(s): TSH in the last 8760 hours. A1C: Lab Results  Component Value Date   HGBA1C 5.4 11/08/2018     Assessment/Plan  1. Chronic RUQ pain Patient has been on  oxycodone for many years and I am sure is addicted.  She has light lots of problems including circulatory cardiac problems  related to her alcohol and tobacco use - Oxycodone HCl 10 MG TABS; take 1 tablet by mouth every 6 hours if needed for pain  Dispense: 120 tablet; Refill: 0  2. Neuropathic pain, leg, right This is a circulatory problem which has been stented - Oxycodone HCl 10 MG TABS; take 1 tablet by mouth every 6 hours if needed for pain  Dispense: 120 tablet; Refill: 0  3. Alcohol abuse Was hospitalized recently for this problem in [place] but she tells me she is not drinking anymore now  4. Chronic pain syndrome See above  5. Occlusion of common femoral artery (HCC) Pulses are actually palpable in her feet  6. Tobacco abuse Continues to smoke.  No hope for stopping   Alain Honey, MD [facility] [phone]

## 2021-05-15 DIAGNOSIS — S92324A Nondisplaced fracture of second metatarsal bone, right foot, initial encounter for closed fracture: Secondary | ICD-10-CM | POA: Diagnosis not present

## 2021-05-16 ENCOUNTER — Other Ambulatory Visit: Payer: Self-pay | Admitting: *Deleted

## 2021-05-16 DIAGNOSIS — F411 Generalized anxiety disorder: Secondary | ICD-10-CM

## 2021-05-16 MED ORDER — LORAZEPAM 0.5 MG PO TABS: ORAL_TABLET | ORAL | 0 refills | Status: DC

## 2021-05-16 NOTE — Telephone Encounter (Signed)
Patient requested refill.  Pended Rx and sent to Dr. Sabra Heck for approval.  Epic LR: 04/07/2021 Contract on File.

## 2021-05-21 ENCOUNTER — Other Ambulatory Visit: Payer: Self-pay | Admitting: *Deleted

## 2021-05-21 DIAGNOSIS — F5101 Primary insomnia: Secondary | ICD-10-CM

## 2021-05-21 MED ORDER — ZOLPIDEM TARTRATE 10 MG PO TABS: ORAL_TABLET | ORAL | 0 refills | Status: DC

## 2021-05-21 NOTE — Telephone Encounter (Signed)
Patient requested refill.  Epic LR: 04/23/2021 Contract on File.  Pended Rx and sent to Dr. Sabra Heck for approval.

## 2021-05-22 ENCOUNTER — Telehealth: Payer: Self-pay | Admitting: *Deleted

## 2021-05-22 NOTE — Telephone Encounter (Signed)
Received Prior Authorization for Lorazepam from [facility] [phone].  Filled out form and placed in Dr. Ammie Ferrier folder to review and sign.  To be faxed back to [facility] once completed.  Fax: [phone]  Member ID: [id number]

## 2021-06-05 ENCOUNTER — Other Ambulatory Visit: Payer: Self-pay | Admitting: *Deleted

## 2021-06-05 MED ORDER — LISINOPRIL 20 MG PO TABS: 20.0000 mg | ORAL_TABLET | Freq: Every day | ORAL | 1 refills | Status: DC

## 2021-06-05 MED ORDER — METOPROLOL SUCCINATE ER 25 MG PO TB24: 25.0000 mg | ORAL_TABLET | Freq: Two times a day (BID) | ORAL | 1 refills | Status: DC

## 2021-06-05 MED ORDER — ESCITALOPRAM OXALATE 10 MG PO TABS: 10.0000 mg | ORAL_TABLET | Freq: Every day | ORAL | 1 refills | Status: DC

## 2021-06-05 NOTE — Telephone Encounter (Signed)
Patient requested refills.

## 2021-06-10 ENCOUNTER — Telehealth: Payer: Self-pay | Admitting: *Deleted

## 2021-06-10 ENCOUNTER — Other Ambulatory Visit: Payer: Self-pay | Admitting: *Deleted

## 2021-06-10 DIAGNOSIS — M792 Neuralgia and neuritis, unspecified: Secondary | ICD-10-CM

## 2021-06-10 DIAGNOSIS — G8929 Other chronic pain: Secondary | ICD-10-CM

## 2021-06-10 DIAGNOSIS — R1011 Right upper quadrant pain: Secondary | ICD-10-CM

## 2021-06-10 NOTE — Telephone Encounter (Signed)
Patient requested refill.  Informed her that it was not due until 10/20.  Last Epic RF: 05/14/2021 Will Pend Rx and sent to Dr. Sabra Heck on Wednesday for approval.  Patient is aware.

## 2021-06-10 NOTE — Telephone Encounter (Signed)
Patient called and stated that she needed a refill on her Metoprolol and Lisinopril.   Patient is Currently taking:  Metoprolol succinate 75mg  twice daily Lisinopril 40mg  once daily.   Is it ok to change this in her medication list and refill. Stated that she thought she updated this at her last appointment.   Please Advise.

## 2021-06-12 DIAGNOSIS — S92324D Nondisplaced fracture of second metatarsal bone, right foot, subsequent encounter for fracture with routine healing: Secondary | ICD-10-CM | POA: Diagnosis not present

## 2021-06-12 MED ORDER — OXYCODONE HCL 10 MG PO TABS: ORAL_TABLET | ORAL | 0 refills | Status: DC

## 2021-06-14 ENCOUNTER — Other Ambulatory Visit: Payer: Self-pay | Admitting: *Deleted

## 2021-06-14 DIAGNOSIS — G8929 Other chronic pain: Secondary | ICD-10-CM

## 2021-06-14 DIAGNOSIS — M792 Neuralgia and neuritis, unspecified: Secondary | ICD-10-CM

## 2021-06-14 DIAGNOSIS — R1011 Right upper quadrant pain: Secondary | ICD-10-CM

## 2021-06-14 MED ORDER — LISINOPRIL 40 MG PO TABS: 40.0000 mg | ORAL_TABLET | Freq: Every day | ORAL | 5 refills | Status: AC

## 2021-06-14 MED ORDER — METOPROLOL SUCCINATE ER 25 MG PO TB24: 75.0000 mg | ORAL_TABLET | Freq: Two times a day (BID) | ORAL | 5 refills | Status: AC

## 2021-06-14 NOTE — Telephone Encounter (Signed)
Patient requested refill.  Last Epic RF: 05/14/2021 Will Pend Rx and sent to Dr. Sabra Heck on Wednesday for approval.  Patient is aware.    Dr. Sabra Heck approved medication but it was sent to the wrong pharmacy. Patient is no longer living in [place].  Pended Rx and sent to [facility] for approval.

## 2021-06-14 NOTE — Telephone Encounter (Signed)
Disregard. Patient stated that she has gotten it taken care of.

## 2021-06-14 NOTE — Telephone Encounter (Signed)
Noted  

## 2021-06-14 NOTE — Telephone Encounter (Signed)
Theresa Honour, MD  You 11 hours ago (8:56 PM)   Okay       Medication list updated and Rx's sent to pharmacy.

## 2021-06-19 ENCOUNTER — Other Ambulatory Visit: Payer: Self-pay | Admitting: *Deleted

## 2021-06-19 DIAGNOSIS — F5101 Primary insomnia: Secondary | ICD-10-CM

## 2021-06-19 NOTE — Telephone Encounter (Signed)
Patient requested refill.  Epic LR: 05/21/2021 Contract on File.  Pended Rx and sent to Dr. Sabra Heck for approval.

## 2021-06-20 MED ORDER — VITAMIN B 12 500 MCG PO TABS
ORAL_TABLET | ORAL | 1 refills | Status: AC
Start: 2021-06-20 — End: ?

## 2021-06-20 MED ORDER — ZOLPIDEM TARTRATE 10 MG PO TABS: ORAL_TABLET | ORAL | 0 refills | Status: DC

## 2021-06-27 ENCOUNTER — Telehealth: Payer: Self-pay | Admitting: *Deleted

## 2021-06-27 NOTE — Telephone Encounter (Signed)
Patient called and stated that she needs a letter from Dr. Sabra Heck.   Stated that she went to [facility] and they are going to start prescribing her Suboxone but she needs a NOTE from her PCP that he will no longer prescribe patient Oxycodone.   Patient stated that she agrees that she has been taking too many and that is the reason she took this step.   Patient is aware that you won't be back in office till Tuesday and she is fine with that.

## 2021-07-03 ENCOUNTER — Encounter: Payer: Self-pay | Admitting: Family Medicine

## 2021-07-03 NOTE — Telephone Encounter (Signed)
Tried calling patient regarding letter is ready for pick up. Voicemail not set up and cannot leave message. Will try again later.   Letter placed up front in drawer.   Oxycodone removed from Current medication list.

## 2021-07-03 NOTE — Addendum Note (Signed)
Addended by: Rafael Bihari A on: 07/03/2021 02:59 PM   Modules accepted: Orders

## 2021-07-03 NOTE — Telephone Encounter (Signed)
Letter written and pended and sent to Dr. Sabra Heck for approval.

## 2021-07-08 NOTE — Telephone Encounter (Signed)
Patient called and requested letter to be faxed to Dr. Dorena Cookey to Fax: [phone]  Letter Faxed.

## 2021-07-09 NOTE — Telephone Encounter (Signed)
I have tried faxing letter to Dr. Dorena Cookey and letter will not go through.   Called patient and she will pick up letter and take to their office and drop off.   Letter left up front for pick up.

## 2021-07-10 DIAGNOSIS — S92324D Nondisplaced fracture of second metatarsal bone, right foot, subsequent encounter for fracture with routine healing: Secondary | ICD-10-CM | POA: Diagnosis not present

## 2021-07-11 ENCOUNTER — Other Ambulatory Visit: Payer: Self-pay

## 2021-07-11 DIAGNOSIS — F5101 Primary insomnia: Secondary | ICD-10-CM

## 2021-07-11 NOTE — Telephone Encounter (Signed)
RX last filled on 06/20/2021 and treatment agreement on file from 01/01/2021

## 2021-07-12 MED ORDER — ZOLPIDEM TARTRATE 10 MG PO TABS: ORAL_TABLET | ORAL | 0 refills | Status: DC

## 2021-07-15 DIAGNOSIS — Z23 Encounter for immunization: Secondary | ICD-10-CM | POA: Diagnosis not present

## 2021-07-22 ENCOUNTER — Telehealth: Payer: Self-pay

## 2021-07-22 NOTE — Telephone Encounter (Signed)
Message left on clinical intake voicemail: Patient called outside of business hours and left message requesting refill on Ambien.  Ambien was last refilled on 07/12/2021 to [facility] on [address] and is not due for another refill until 08/11/2021 according to our records.   I called patient back today and was unable to make contact or leave a message due to voicemail not set up. I will try to call patient  again later.

## 2021-07-23 NOTE — Telephone Encounter (Signed)
Patient called back stating she let a message yesterday and had not heard back. I suggested that patient set up her voicemail as I tried to call her back yesterday and because she does not have a voicemail I was unable to leave a message confirming that I received her message.  Patient was informed rx approved on 07/12/2021 and plans to call [facility] to confirm and will call us back if needed.

## 2021-07-30 ENCOUNTER — Other Ambulatory Visit: Payer: Self-pay

## 2021-07-30 ENCOUNTER — Ambulatory Visit (INDEPENDENT_AMBULATORY_CARE_PROVIDER_SITE_OTHER): Payer: Medicare Other | Admitting: Family Medicine

## 2021-07-30 ENCOUNTER — Encounter: Payer: Self-pay | Admitting: Family Medicine

## 2021-07-30 VITALS — BP 130/80 | HR 64 | Temp 97.7°F | Ht 65.0 in | Wt 117.0 lb

## 2021-07-30 DIAGNOSIS — E785 Hyperlipidemia, unspecified: Secondary | ICD-10-CM

## 2021-07-30 DIAGNOSIS — R634 Abnormal weight loss: Secondary | ICD-10-CM | POA: Diagnosis not present

## 2021-07-30 DIAGNOSIS — G894 Chronic pain syndrome: Secondary | ICD-10-CM

## 2021-07-30 DIAGNOSIS — I70209 Unspecified atherosclerosis of native arteries of extremities, unspecified extremity: Secondary | ICD-10-CM

## 2021-07-30 DIAGNOSIS — D649 Anemia, unspecified: Secondary | ICD-10-CM

## 2021-07-30 DIAGNOSIS — F5101 Primary insomnia: Secondary | ICD-10-CM | POA: Diagnosis not present

## 2021-07-30 DIAGNOSIS — F1010 Alcohol abuse, uncomplicated: Secondary | ICD-10-CM

## 2021-07-30 DIAGNOSIS — F419 Anxiety disorder, unspecified: Secondary | ICD-10-CM

## 2021-07-30 MED ORDER — ESZOPICLONE 2 MG PO TABS: 2.0000 mg | ORAL_TABLET | Freq: Every evening | ORAL | 0 refills | Status: DC | PRN

## 2021-07-30 MED ORDER — ESCITALOPRAM OXALATE 20 MG PO TABS: 20.0000 mg | ORAL_TABLET | Freq: Every day | ORAL | 1 refills | Status: AC

## 2021-07-30 NOTE — Progress Notes (Signed)
Provider:  Alain Honey, MD  Careteam: Patient Care Team: [name], MD as PCP - General (Family Medicine)  PLACE OF SERVICE:  [place]  Advanced Directive information    Allergies  Allergen Reactions   Avelox [Moxifloxacin Hcl In Nacl]     Low blood pressure   Compazine [Prochlorperazine Edisylate] Other (See Comments)    Caused lockjaw   Ibuprofen Hives   Morphine And Related Nausea And Vomiting   Promethazine Hcl Nausea And Vomiting    Chief Complaint  Patient presents with   Medical Management of Chronic Issues    Patient presents today for a 3 month follow-up.   Quality Metric Gaps     HPI: Patient is a 65 y.o. female .  Patient is here for 34-monthfollow-up.  Has recently gone on a Suboxone program.  She gets drug tested every month.  She feels like the Suboxone was more effective for her chronic pains. She has lost weight appetite is poor she has stopped several medicines on her med list including Plavix ferrous sulfate gabapentin lorazepam. Not sleeping well has been on Ambien for some time in  Review of Systems:  Review of Systems  Constitutional:  Positive for weight loss.  HENT: Negative.    Respiratory: Negative.    Cardiovascular: Negative.   Genitourinary: Negative.   Skin: Negative.   Psychiatric/Behavioral:  The patient is nervous/anxious and has insomnia.   All other systems reviewed and are negative.  Past Medical History:  Diagnosis Date   Abdominal pain 2000   chronic abdominal pain with nausea and vomiting   Abnormal liver function    Allergy    Hypertension    Past Surgical History:  Procedure Laterality Date   ABDOMINAL HYSTERECTOMY     CARDIAC CATHETERIZATION  10/11/2019   CHOLECYSTECTOMY     Social History:   reports that she has been smoking cigarettes. She has been smoking an average of .5 packs per day. She has never used smokeless tobacco. She reports current alcohol use. She reports that she does not use  drugs.  Family History  Problem Relation Age of Onset   Hypertension Father    Cancer - Other Father        Bladder   Dementia Father    Hypertension Mother    Parkinson's disease Mother    Diabetes Maternal Grandmother    Cancer - Other Maternal Grandmother        Pancreatic   Cancer - Other Paternal Grandmother        Bone    Medications: Patient's Medications  New Prescriptions   No medications on file  Previous Medications   ASPIRIN 81 MG CHEWABLE TABLET    Chew 81 mg by mouth daily.    BIOTIN 1 MG CAPS    Take 1 mg by mouth daily at 6 (six) AM.   CHOLECALCIFEROL (VITAMIN D) 2000 UNITS CAPS    Take 1 capsule (2,000 Units total) by mouth daily.   CLOPIDOGREL (PLAVIX) 75 MG TABLET    Take 75 mg by mouth daily.   CYANOCOBALAMIN (VITAMIN B 12) 500 MCG TABS    Take one tablet by mouth once daily.   ESCITALOPRAM (LEXAPRO) 10 MG TABLET    Take 1 tablet (10 mg total) by mouth daily.   FAMOTIDINE (PEPCID) 40 MG TABLET    take 1 tablet by mouth once daily   FERROUS SULFATE 325 (65 FE) MG EC TABLET    Take 325 mg  by mouth.   GABAPENTIN (NEURONTIN) 100 MG CAPSULE    take 1 capsule by mouth three times a day   LISINOPRIL (ZESTRIL) 40 MG TABLET    Take 1 tablet (40 mg total) by mouth daily.   LORAZEPAM (ATIVAN) 0.5 MG TABLET    take 1 tablet by mouth every 6 hours if needed   METOPROLOL SUCCINATE (TOPROL-XL) 25 MG 24 HR TABLET    Take 3 tablets (75 mg total) by mouth 2 (two) times daily. Morning and Night.   PANTOPRAZOLE (PROTONIX) 40 MG TABLET    take 1 tablet by mouth twice a day   SIMVASTATIN (ZOCOR) 20 MG TABLET    Take 1 tablet (20 mg total) by mouth at bedtime.   VALACYCLOVIR (VALTREX) 500 MG TABLET    take 1 tablet by mouth twice a day   ZOLPIDEM (AMBIEN) 10 MG TABLET    take 1 tablet by mouth at bedtime for sleep  Modified Medications   No medications on file  Discontinued Medications   No medications on file    Physical Exam:  There were no vitals filed for this  visit. There is no height or weight on file to calculate BMI. Wt Readings from Last 3 Encounters:  05/10/21 125 lb (56.7 kg)  01/01/21 126 lb 3.2 oz (57.2 kg)  11/08/18 120 lb (54.4 kg)    Physical Exam Vitals and nursing note reviewed.  Constitutional:      Appearance: Normal appearance.  HENT:     Head: Normocephalic.  Cardiovascular:     Rate and Rhythm: Normal rate and regular rhythm.     Pulses: Normal pulses.  Pulmonary:     Effort: Pulmonary effort is normal.     Breath sounds: Normal breath sounds.  Abdominal:     General: Abdomen is flat.     Palpations: Abdomen is soft.     Tenderness: There is no abdominal tenderness. There is no guarding.  Neurological:     General: No focal deficit present.     Mental Status: She is alert and oriented to person, place, and time.  Psychiatric:        Mood and Affect: Mood normal.        Behavior: Behavior normal.    Labs reviewed: Basic Metabolic Panel: No results for input(s): NA, K, CL, CO2, GLUCOSE, BUN, CREATININE, CALCIUM, MG, PHOS, TSH in the last 8760 hours. Liver Function Tests: No results for input(s): AST, ALT, ALKPHOS, BILITOT, PROT, ALBUMIN in the last 8760 hours. No results for input(s): LIPASE, AMYLASE in the last 8760 hours. No results for input(s): AMMONIA in the last 8760 hours. CBC: No results for input(s): WBC, NEUTROABS, HGB, HCT, MCV, PLT in the last 8760 hours. Lipid Panel: No results for input(s): CHOL, HDL, LDLCALC, TRIG, CHOLHDL, LDLDIRECT in the last 8760 hours. TSH: No results for input(s): TSH in the last 8760 hours. A1C: Lab Results  Component Value Date   HGBA1C 5.4 11/08/2018     Assessment/Plan  1. Weight loss Looking for correctable cause of weight loss possible hyperthyroid will screen with TSH - TSH  2. Anemia, unspecified type Has run out of iron sure she still needs it we will check an update blood counts - CBC (no diff)  3. Hyperlipidemia, unspecified hyperlipidemia  type She takes simvastatin lipids have not been checked in several years - Lipid panel - CMP with eGFR([facility])  4. Primary insomnia Substitute Lunesta or Ambien - eszopiclone (LUNESTA) 2 MG TABS tablet; Take 1  tablet (2 mg total) by mouth at bedtime as needed for sleep. Take immediately before bedtime  Dispense: 30 tablet; Refill: 0  5. Anxiety Had been on Ambien for some time we will discontinue Ambien in favor of Lunesta - escitalopram (LEXAPRO) 20 MG tablet; Take 1 tablet (20 mg total) by mouth daily.  Dispense: 30 tablet; Refill: 1  6. Alcohol abuse Says she is not drinking anymore.  Review of the EMR shows ER admission back in July for probable alcohol use  7. Chronic pain syndrome Continue with Suboxone and drug monitoring program  8. Occlusion of common femoral artery ([place]) Had bypass surgery in [place] symptoms have improved   Alain Honey, MD [facility] [phone]

## 2021-07-31 LAB — COMPLETE METABOLIC PANEL WITH GFR
AG Ratio: 1.8 (calc) (ref 1.0–2.5)
ALT: 21 U/L (ref 6–29)
AST: 19 U/L (ref 10–35)
Albumin: 3.9 g/dL (ref 3.6–5.1)
Alkaline phosphatase (APISO): 236 U/L — ABNORMAL HIGH (ref 37–153)
BUN: 12 mg/dL (ref 7–25)
CO2: 26 mmol/L (ref 20–32)
Calcium: 9.1 mg/dL (ref 8.6–10.4)
Chloride: 103 mmol/L (ref 98–110)
Creat: 0.54 mg/dL (ref 0.50–1.05)
Globulin: 2.2 g/dL (calc) (ref 1.9–3.7)
Glucose, Bld: 100 mg/dL (ref 65–139)
Potassium: 4 mmol/L (ref 3.5–5.3)
Sodium: 138 mmol/L (ref 135–146)
Total Bilirubin: 0.9 mg/dL (ref 0.2–1.2)
Total Protein: 6.1 g/dL (ref 6.1–8.1)
eGFR: 101 mL/min/{1.73_m2} (ref >=60)

## 2021-07-31 LAB — LIPID PANEL
Cholesterol: 174 mg/dL (ref <200)
HDL: 61 mg/dL (ref >=50)
LDL Cholesterol (Calc): 93 mg/dL (calc)
Non-HDL Cholesterol (Calc): 113 mg/dL (calc) (ref <130)
Total CHOL/HDL Ratio: 2.9 (calc) (ref <5.0)
Triglycerides: 104 mg/dL (ref <150)

## 2021-07-31 LAB — CBC
HCT: 38.3 % (ref 35.0–45.0)
Hemoglobin: 12.5 g/dL (ref 11.7–15.5)
MCH: 32.2 pg (ref 27.0–33.0)
MCHC: 32.6 g/dL (ref 32.0–36.0)
MCV: 98.7 fL (ref 80.0–100.0)
MPV: 10.8 fL (ref 7.5–12.5)
Platelets: 367 10*3/uL (ref 140–400)
RBC: 3.88 10*6/uL (ref 3.80–5.10)
RDW: 14.6 % (ref 11.0–15.0)
WBC: 8.8 10*3/uL (ref 3.8–10.8)

## 2021-07-31 LAB — TSH: TSH: 1.19 mIU/L (ref 0.40–4.50)

## 2021-08-06 ENCOUNTER — Telehealth: Payer: Self-pay | Admitting: *Deleted

## 2021-08-06 MED ORDER — ROSUVASTATIN CALCIUM 5 MG PO TABS: 5.0000 mg | ORAL_TABLET | Freq: Every day | ORAL | 3 refills | Status: AC

## 2021-08-06 NOTE — Telephone Encounter (Signed)
Patient notified and agreed. Patient will discontinue the Simvastatin and start Crestor.  Medication list updated and Rx sent to pharmacy.

## 2021-08-06 NOTE — Telephone Encounter (Signed)
-----   Message from [name], MD sent at 08/02/2021  1:20 PM EST ----- Let's try Crestor, 5 mg ----- Message ----- From: Indio Santilli A, CMA Sent: 08/01/2021  11:51 AM EST To: [name], MD  Patient notified and agreed. Patient stated that she is taking Simvastatin 20 mg once daily. Patient stated that she cannot tolerate the Atorvastatin. Please Advise.

## 2021-08-07 DIAGNOSIS — S92324D Nondisplaced fracture of second metatarsal bone, right foot, subsequent encounter for fracture with routine healing: Secondary | ICD-10-CM | POA: Diagnosis not present

## 2021-08-09 ENCOUNTER — Other Ambulatory Visit: Payer: Self-pay | Admitting: *Deleted

## 2021-08-09 DIAGNOSIS — B009 Herpesviral infection, unspecified: Secondary | ICD-10-CM

## 2021-08-09 MED ORDER — VALACYCLOVIR HCL 500 MG PO TABS: 500.0000 mg | ORAL_TABLET | Freq: Two times a day (BID) | ORAL | 5 refills | Status: AC

## 2021-08-09 NOTE — Telephone Encounter (Signed)
Refill Requested by pharmacy.

## 2021-08-20 DIAGNOSIS — I70209 Unspecified atherosclerosis of native arteries of extremities, unspecified extremity: Secondary | ICD-10-CM | POA: Diagnosis not present

## 2021-08-20 DIAGNOSIS — F419 Anxiety disorder, unspecified: Secondary | ICD-10-CM | POA: Diagnosis not present

## 2021-08-20 DIAGNOSIS — Z720 Tobacco use: Secondary | ICD-10-CM | POA: Diagnosis not present

## 2021-08-20 DIAGNOSIS — E785 Hyperlipidemia, unspecified: Secondary | ICD-10-CM | POA: Diagnosis not present

## 2021-08-20 DIAGNOSIS — I429 Cardiomyopathy, unspecified: Secondary | ICD-10-CM | POA: Diagnosis not present

## 2021-08-20 DIAGNOSIS — I10 Essential (primary) hypertension: Secondary | ICD-10-CM | POA: Diagnosis not present

## 2021-08-27 ENCOUNTER — Other Ambulatory Visit: Payer: Self-pay | Admitting: *Deleted

## 2021-08-27 DIAGNOSIS — F5101 Primary insomnia: Secondary | ICD-10-CM

## 2021-08-27 MED ORDER — ESZOPICLONE 2 MG PO TABS: 2.0000 mg | ORAL_TABLET | Freq: Every evening | ORAL | 0 refills | Status: AC | PRN

## 2021-08-27 NOTE — Telephone Encounter (Signed)
Patient called requesting refill on her medication.  Epic LR: 07/30/2021 Pended Rx and sent to Dr. Sabra Heck for approval.

## 2021-09-25 DIAGNOSIS — 419620001 Death: Secondary | SNOMED CT | POA: Diagnosis not present

## 2021-09-25 DEATH — deceased
